# Patient Record
Sex: Male | Born: 1955 | Race: White | Hispanic: No | Marital: Single | Smoking: Current every day smoker
Health system: Southern US, Community
[De-identification: ages and names within clinical notes are randomized; demographics above are authoritative.]

## PROBLEM LIST (undated history)

## (undated) ENCOUNTER — Ambulatory Visit: Admission: EM | Payer: Self-pay | Source: Home / Self Care

## (undated) DIAGNOSIS — E785 Hyperlipidemia, unspecified: Secondary | ICD-10-CM

## (undated) DIAGNOSIS — K760 Fatty (change of) liver, not elsewhere classified: Secondary | ICD-10-CM

## (undated) DIAGNOSIS — K5790 Diverticulosis of intestine, part unspecified, without perforation or abscess without bleeding: Secondary | ICD-10-CM

## (undated) DIAGNOSIS — K635 Polyp of colon: Secondary | ICD-10-CM

## (undated) DIAGNOSIS — E119 Type 2 diabetes mellitus without complications: Secondary | ICD-10-CM

## (undated) DIAGNOSIS — F17200 Nicotine dependence, unspecified, uncomplicated: Secondary | ICD-10-CM

## (undated) DIAGNOSIS — I10 Essential (primary) hypertension: Secondary | ICD-10-CM

## (undated) DIAGNOSIS — R918 Other nonspecific abnormal finding of lung field: Secondary | ICD-10-CM

## (undated) HISTORY — DX: Other nonspecific abnormal finding of lung field: R91.8

## (undated) HISTORY — DX: Nicotine dependence, unspecified, uncomplicated: F17.200

## (undated) HISTORY — DX: Essential (primary) hypertension: I10

## (undated) HISTORY — DX: Type 2 diabetes mellitus without complications: E11.9

## (undated) HISTORY — DX: Diverticulosis of intestine, part unspecified, without perforation or abscess without bleeding: K57.90

## (undated) HISTORY — PX: APPENDECTOMY: SHX54

## (undated) HISTORY — DX: Polyp of colon: K63.5

## (undated) HISTORY — DX: Fatty (change of) liver, not elsewhere classified: K76.0

## (undated) HISTORY — DX: Hyperlipidemia, unspecified: E78.5

---

## 2000-02-21 ENCOUNTER — Emergency Department (HOSPITAL_COMMUNITY): Admission: EM | Admit: 2000-02-21 | Discharge: 2000-02-21 | Payer: Self-pay | Admitting: Emergency Medicine

## 2015-01-02 ENCOUNTER — Encounter: Payer: Self-pay | Admitting: Internal Medicine

## 2015-01-02 ENCOUNTER — Ambulatory Visit: Payer: Self-pay | Attending: Internal Medicine | Admitting: Internal Medicine

## 2015-01-02 VITALS — BP 136/90 | HR 80 | Temp 97.8°F | Resp 16 | Wt 229.6 lb

## 2015-01-02 DIAGNOSIS — Z1211 Encounter for screening for malignant neoplasm of colon: Secondary | ICD-10-CM

## 2015-01-02 DIAGNOSIS — Z720 Tobacco use: Secondary | ICD-10-CM | POA: Insufficient documentation

## 2015-01-02 DIAGNOSIS — Z87448 Personal history of other diseases of urinary system: Secondary | ICD-10-CM

## 2015-01-02 DIAGNOSIS — Z139 Encounter for screening, unspecified: Secondary | ICD-10-CM

## 2015-01-02 DIAGNOSIS — IMO0001 Reserved for inherently not codable concepts without codable children: Secondary | ICD-10-CM

## 2015-01-02 DIAGNOSIS — Z833 Family history of diabetes mellitus: Secondary | ICD-10-CM

## 2015-01-02 DIAGNOSIS — R319 Hematuria, unspecified: Secondary | ICD-10-CM | POA: Insufficient documentation

## 2015-01-02 DIAGNOSIS — Z23 Encounter for immunization: Secondary | ICD-10-CM

## 2015-01-02 DIAGNOSIS — R1030 Lower abdominal pain, unspecified: Secondary | ICD-10-CM | POA: Insufficient documentation

## 2015-01-02 DIAGNOSIS — F17200 Nicotine dependence, unspecified, uncomplicated: Secondary | ICD-10-CM | POA: Insufficient documentation

## 2015-01-02 LAB — CBC WITH DIFFERENTIAL/PLATELET
BASOS PCT: 0 % (ref 0–1)
Basophils Absolute: 0 10*3/uL (ref 0.0–0.1)
Eosinophils Absolute: 0.1 10*3/uL (ref 0.0–0.7)
Eosinophils Relative: 2 % (ref 0–5)
HEMATOCRIT: 41.3 % (ref 39.0–52.0)
HEMOGLOBIN: 14.4 g/dL (ref 13.0–17.0)
LYMPHS ABS: 1.5 10*3/uL (ref 0.7–4.0)
Lymphocytes Relative: 26 % (ref 12–46)
MCH: 30.8 pg (ref 26.0–34.0)
MCHC: 34.9 g/dL (ref 30.0–36.0)
MCV: 88.4 fL (ref 78.0–100.0)
MONO ABS: 0.3 10*3/uL (ref 0.1–1.0)
MONOS PCT: 5 % (ref 3–12)
MPV: 9.6 fL (ref 8.6–12.4)
NEUTROS ABS: 3.9 10*3/uL (ref 1.7–7.7)
Neutrophils Relative %: 67 % (ref 43–77)
Platelets: 262 10*3/uL (ref 150–400)
RBC: 4.67 MIL/uL (ref 4.22–5.81)
RDW: 14.2 % (ref 11.5–15.5)
WBC: 5.8 10*3/uL (ref 4.0–10.5)

## 2015-01-02 LAB — LIPID PANEL
Cholesterol: 181 mg/dL (ref 0–200)
HDL: 30 mg/dL — ABNORMAL LOW (ref >=40)
LDL Cholesterol: 79 mg/dL (ref 0–99)
Total CHOL/HDL Ratio: 6 Ratio
Triglycerides: 362 mg/dL — ABNORMAL HIGH (ref <150)
VLDL: 72 mg/dL — ABNORMAL HIGH (ref 0–40)

## 2015-01-02 LAB — COMPLETE METABOLIC PANEL WITH GFR
ALBUMIN: 4.6 g/dL (ref 3.5–5.2)
ALK PHOS: 46 U/L (ref 39–117)
ALT: 43 U/L (ref 0–53)
AST: 38 U/L — ABNORMAL HIGH (ref 0–37)
BILIRUBIN TOTAL: 0.5 mg/dL (ref 0.2–1.2)
BUN: 21 mg/dL (ref 6–23)
CO2: 26 mEq/L (ref 19–32)
Calcium: 9.5 mg/dL (ref 8.4–10.5)
Chloride: 103 mEq/L (ref 96–112)
Creat: 0.8 mg/dL (ref 0.50–1.35)
GFR, Est African American: >89 mL/min
Glucose, Bld: 119 mg/dL — ABNORMAL HIGH (ref 70–99)
POTASSIUM: 4.6 meq/L (ref 3.5–5.3)
Sodium: 139 mEq/L (ref 135–145)
Total Protein: 7.3 g/dL (ref 6.0–8.3)

## 2015-01-02 LAB — TSH: TSH: 1.807 u[IU]/mL (ref 0.350–4.500)

## 2015-01-02 LAB — HEMOGLOBIN A1C
Hgb A1c MFr Bld: 6.6 % — ABNORMAL HIGH (ref <5.7)
MEAN PLASMA GLUCOSE: 143 mg/dL — AB (ref <117)

## 2015-01-02 NOTE — Patient Instructions (Signed)
Smoking Cessation Quitting smoking is important to your health and has many advantages. However, it is not always easy to quit since nicotine is a very addictive drug. Oftentimes, people try 3 times or more before being able to quit. This document explains the best ways for you to prepare to quit smoking. Quitting takes hard work and a lot of effort, but you can do it. ADVANTAGES OF QUITTING SMOKING  You will live longer, feel better, and live better.  Your body will feel the impact of quitting smoking almost immediately.  Within 20 minutes, blood pressure decreases. Your pulse returns to its normal level.  After 8 hours, carbon monoxide levels in the blood return to normal. Your oxygen level increases.  After 24 hours, the chance of having a heart attack starts to decrease. Your breath, hair, and body stop smelling like smoke.  After 48 hours, damaged nerve endings begin to recover. Your sense of taste and smell improve.  After 72 hours, the body is virtually free of nicotine. Your bronchial tubes relax and breathing becomes easier.  After 2 to 12 weeks, lungs can hold more air. Exercise becomes easier and circulation improves.  The risk of having a heart attack, stroke, cancer, or lung disease is greatly reduced.  After 1 year, the risk of coronary heart disease is cut in half.  After 5 years, the risk of stroke falls to the same as a nonsmoker.  After 10 years, the risk of lung cancer is cut in half and the risk of other cancers decreases significantly.  After 15 years, the risk of coronary heart disease drops, usually to the level of a nonsmoker.  If you are pregnant, quitting smoking will improve your chances of having a healthy baby.  The people you live with, especially any children, will be healthier.  You will have extra money to spend on things other than cigarettes. QUESTIONS TO THINK ABOUT BEFORE ATTEMPTING TO QUIT You may want to talk about your answers with your  health care provider.  Why do you want to quit?  If you tried to quit in the past, what helped and what did not?  What will be the most difficult situations for you after you quit? How will you plan to handle them?  Who can help you through the tough times? Your family? Friends? A health care provider?  What pleasures do you get from smoking? What ways can you still get pleasure if you quit? Here are some questions to ask your health care provider:  How can you help me to be successful at quitting?  What medicine do you think would be best for me and how should I take it?  What should I do if I need more help?  What is smoking withdrawal like? How can I get information on withdrawal? GET READY  Set a quit date.  Change your environment by getting rid of all cigarettes, ashtrays, matches, and lighters in your home, car, or work. Do not let people smoke in your home.  Review your past attempts to quit. Think about what worked and what did not. GET SUPPORT AND ENCOURAGEMENT You have a better chance of being successful if you have help. You can get support in many ways.  Tell your family, friends, and coworkers that you are going to quit and need their support. Ask them not to smoke around you.  Get individual, group, or telephone counseling and support. Programs are available at local hospitals and health centers. Call   your local health department for information about programs in your area.  Spiritual beliefs and practices may help some smokers quit.  Download a "quit meter" on your computer to keep track of quit statistics, such as how long you have gone without smoking, cigarettes not smoked, and money saved.  Get a self-help book about quitting smoking and staying off tobacco. LEARN NEW SKILLS AND BEHAVIORS  Distract yourself from urges to smoke. Talk to someone, go for a walk, or occupy your time with a task.  Change your normal routine. Take a different route to work.  Drink tea instead of coffee. Eat breakfast in a different place.  Reduce your stress. Take a hot bath, exercise, or read a book.  Plan something enjoyable to do every day. Reward yourself for not smoking.  Explore interactive web-based programs that specialize in helping you quit. GET MEDICINE AND USE IT CORRECTLY Medicines can help you stop smoking and decrease the urge to smoke. Combining medicine with the above behavioral methods and support can greatly increase your chances of successfully quitting smoking.  Nicotine replacement therapy helps deliver nicotine to your body without the negative effects and risks of smoking. Nicotine replacement therapy includes nicotine gum, lozenges, inhalers, nasal sprays, and skin patches. Some may be available over-the-counter and others require a prescription.  Antidepressant medicine helps people abstain from smoking, but how this works is unknown. This medicine is available by prescription.  Nicotinic receptor partial agonist medicine simulates the effect of nicotine in your brain. This medicine is available by prescription. Ask your health care provider for advice about which medicines to use and how to use them based on your health history. Your health care provider will tell you what side effects to look out for if you choose to be on a medicine or therapy. Carefully read the information on the package. Do not use any other product containing nicotine while using a nicotine replacement product.  RELAPSE OR DIFFICULT SITUATIONS Most relapses occur within the first 3 months after quitting. Do not be discouraged if you start smoking again. Remember, most people try several times before finally quitting. You may have symptoms of withdrawal because your body is used to nicotine. You may crave cigarettes, be irritable, feel very hungry, cough often, get headaches, or have difficulty concentrating. The withdrawal symptoms are only temporary. They are strongest  when you first quit, but they will go away within 10-14 days. To reduce the chances of relapse, try to:  Avoid drinking alcohol. Drinking lowers your chances of successfully quitting.  Reduce the amount of caffeine you consume. Once you quit smoking, the amount of caffeine in your body increases and can give you symptoms, such as a rapid heartbeat, sweating, and anxiety.  Avoid smokers because they can make you want to smoke.  Do not let weight gain distract you. Many smokers will gain weight when they quit, usually less than 10 pounds. Eat a healthy diet and stay active. You can always lose the weight gained after you quit.  Find ways to improve your mood other than smoking. FOR MORE INFORMATION  www.smokefree.gov  Document Released: 09/14/2001 Document Revised: 02/04/2014 Document Reviewed: 12/30/2011 Exit[employer] Patient Information 2015 [employer]. This information is not intended to replace advice given to you by your health care provider. Make sure you discuss any questions you have with your health care provider.  

## 2015-01-02 NOTE — Progress Notes (Signed)
Patient here to establish care Complains of constant stomach pain Wakes up every day complaining of the pain Had a scan done at [facility] hospital and was told he has bilateral hernias Patient has not yet had a colonoscopy

## 2015-01-02 NOTE — Progress Notes (Signed)
Patient Demographics  Kyle Campos, is a 59 y.o. male  FXT:[id number]  GDJ:[id number]  DOB - Sep 27, 1956  CC:  Chief Complaint  Patient presents with  . Establish Care       HPI: Kyle Campos is a 59 y.o. male here today to establish medical care.patient reported to have lower abdominal pain on and off for several months as per patient he was seen by one of the providers and had a CAT scan done ?was told he has hernia, denies any reflux symptoms, he was also told that he has blood in his urine, never been diagnosed with any kidney stones, he denies any change in bowel habits denies any urinary symptoms denies any nausea vomiting, denies any change in weight or appetite, does smoke cigarettes, I have counseled patient to quit smoking. Patient also reports family history of diabetes. Patient has No headache, No chest pain, No abdominal pain - No Nausea, No new weakness tingling or numbness, No Cough - SOB.  No Known Allergies History reviewed. No pertinent past medical history. No current outpatient prescriptions on file prior to visit.   No current facility-administered medications on file prior to visit.   Family History  Problem Relation Age of Onset  . Diabetes Mother   . Cancer Father     lung cancer   . Stroke Maternal Grandfather    History   Social History  . Marital Status: Single    Spouse Name: N/A  . Number of Children: N/A  . Years of Education: N/A   Occupational History  . Not on file.   Social History Main Topics  . Smoking status: Current Every Day Smoker -- 1.00 packs/day for 35 years  . Smokeless tobacco: Not on file  . Alcohol Use: No  . Drug Use: Not on file  . Sexual Activity: Not on file   Other Topics Concern  . Not on file   Social History Narrative  . No narrative on file    Review of Systems: Constitutional: Negative for fever, chills, diaphoresis, activity change, appetite change and fatigue. HENT: Negative for ear pain,  nosebleeds, congestion, facial swelling, rhinorrhea, neck pain, neck stiffness and ear discharge.  Eyes: Negative for pain, discharge, redness, itching and visual disturbance. Respiratory: Negative for cough, choking, chest tightness, shortness of breath, wheezing and stridor.  Cardiovascular: Negative for chest pain, palpitations and leg swelling. Gastrointestinal: Negative for abdominal distention. Genitourinary: Negative for dysuria, urgency, frequency, hematuria, flank pain, decreased urine volume, difficulty urinating and dyspareunia.  Musculoskeletal: Negative for back pain, joint swelling, arthralgia and gait problem. Neurological: Negative for dizziness, tremors, seizures, syncope, facial asymmetry, speech difficulty, weakness, light-headedness, numbness and headaches.  Hematological: Negative for adenopathy. Does not bruise/bleed easily. Psychiatric/Behavioral: Negative for hallucinations, behavioral problems, confusion, dysphoric mood, decreased concentration and agitation.    Objective:   Filed Vitals:   01/02/15 1108  BP: 136/90  Pulse: 80  Temp: 97.8 F (36.6 C)  Resp: 16    Physical Exam: Constitutional: Patient appears well-developed and well-nourished. No distress. HENT: Normocephalic, atraumatic, External right and left ear normal. Oropharynx is clear and moist.  Eyes: Conjunctivae and EOM are normal. PERRLA, no scleral icterus. Neck: Normal ROM. Neck supple. No JVD. No tracheal deviation. No thyromegaly. CVS: RRR, S1/S2 +, no murmurs, no gallops, no carotid bruit.  Pulmonary: Effort and breath sounds normal, no stridor, rhonchi, wheezes, rales.  Abdominal: Soft. BS +, no distension, left lower abdomen tenderness with deep palpation. No rebound or guarding  Musculoskeletal: Normal range of motion. No edema and no tenderness.  Neuro: Alert. Normal reflexes, muscle tone coordination. No cranial nerve deficit. Skin: Skin is warm and dry. No rash noted. Not diaphoretic.  No erythema. No pallor. Psychiatric: Normal mood and affect. Behavior, judgment, thought content normal.  No results found for: WBC, HGB, HCT, MCV, PLT No results found for: CREATININE, BUN, NA, K, CL, CO2  No results found for: HGBA1C Lipid Panel  No results found for: CHOL, TRIG, HDL, CHOLHDL, VLDL, LDLCALC     Assessment and plan:   1. Lower abdominal pain  - CT Abdomen Pelvis Wo Contrast; Future  2. History of hematuria  - Urinalysis, Routine w reflex microscopic  3. Family history of diabetes mellitus (DM)  - Hemoglobin A1c  4. Smoking Advise patient to quit smoking.  5. Screening Ordered baseline blood work. - TSH - COMPLETE METABOLIC PANEL WITH GFR - CBC with Differential/Platelet - Lipid panel - Vit D  25 hydroxy (rtn osteoporosis monitoring)  6. Need for prophylactic vaccination against Streptococcus pneumoniae (pneumococcus)  - Pneumococcal polysaccharide vaccine 23-valent greater than or equal to 2yo subcutaneous/IM  7. Special screening for malignant neoplasms, colon  - Ambulatory referral to Gastroenterology      Health Maintenance -Colonoscopy:referred to GI  -Vaccinations: Pneumovax given today.   Return in about 3 months (around 04/03/2015), or if symptoms worsen or fail to improve.    The patient was given clear instructions to go to ER or return to medical center if symptoms don't improve, worsen or new problems develop. The patient verbalized understanding. The patient was told to call to get lab results if they haven't heard anything in the next week.    This note has been created with Surveyor, quantity. Any transcriptional errors are unintentional.   Lorayne Marek, MD

## 2015-01-03 ENCOUNTER — Ambulatory Visit (HOSPITAL_COMMUNITY)
Admission: RE | Admit: 2015-01-03 | Discharge: 2015-01-03 | Disposition: A | Discharge status: Home / Self Care | Payer: Self-pay | Source: Ambulatory Visit | Attending: Internal Medicine | Admitting: Internal Medicine

## 2015-01-03 ENCOUNTER — Encounter (HOSPITAL_COMMUNITY): Payer: Self-pay

## 2015-01-03 DIAGNOSIS — R1030 Lower abdominal pain, unspecified: Secondary | ICD-10-CM

## 2015-01-03 DIAGNOSIS — R918 Other nonspecific abnormal finding of lung field: Secondary | ICD-10-CM | POA: Insufficient documentation

## 2015-01-03 DIAGNOSIS — R1032 Left lower quadrant pain: Secondary | ICD-10-CM | POA: Insufficient documentation

## 2015-01-03 DIAGNOSIS — K760 Fatty (change of) liver, not elsewhere classified: Secondary | ICD-10-CM | POA: Insufficient documentation

## 2015-01-03 DIAGNOSIS — R319 Hematuria, unspecified: Secondary | ICD-10-CM | POA: Insufficient documentation

## 2015-01-03 DIAGNOSIS — K5790 Diverticulosis of intestine, part unspecified, without perforation or abscess without bleeding: Secondary | ICD-10-CM | POA: Insufficient documentation

## 2015-01-03 DIAGNOSIS — Z9049 Acquired absence of other specified parts of digestive tract: Secondary | ICD-10-CM | POA: Insufficient documentation

## 2015-01-03 LAB — URINALYSIS, ROUTINE W REFLEX MICROSCOPIC
Bilirubin Urine: NEGATIVE
GLUCOSE, UA: 100 mg/dL — AB
Ketones, ur: NEGATIVE mg/dL
LEUKOCYTES UA: NEGATIVE
Nitrite: NEGATIVE
PROTEIN: NEGATIVE mg/dL
SPECIFIC GRAVITY, URINE: 1.023 (ref 1.005–1.030)
Urobilinogen, UA: 1 mg/dL (ref 0.0–1.0)
pH: 6.5 (ref 5.0–8.0)

## 2015-01-03 LAB — VITAMIN D 25 HYDROXY (VIT D DEFICIENCY, FRACTURES): Vit D, 25-Hydroxy: 28 ng/mL — ABNORMAL LOW (ref 30–100)

## 2015-01-03 LAB — URINALYSIS, MICROSCOPIC ONLY
Bacteria, UA: NONE SEEN
Casts: NONE SEEN
Crystals: NONE SEEN
SQUAMOUS EPITHELIAL / LPF: NONE SEEN

## 2015-01-06 ENCOUNTER — Encounter: Payer: Self-pay | Admitting: Internal Medicine

## 2015-01-13 ENCOUNTER — Telehealth: Payer: Self-pay

## 2015-01-13 ENCOUNTER — Telehealth: Payer: Self-pay | Admitting: *Deleted

## 2015-01-13 NOTE — Telephone Encounter (Signed)
Spoke with patietn and informed him of below

## 2015-01-13 NOTE — Telephone Encounter (Signed)
-----   Message from Lorayne Marek, MD sent at 01/03/2015 12:05 PM EDT ----- Patient hemoglobin A1c 6.6%, patient has diabetes, advise patient for low carbohydrate diet and start taking metformin 500 mg daily, will repeat A1c in 3 months. Also noted elevated triglycerides( also reported fatty liver on the CT scan) advised patient for low fat diet and exercise. , noticed low vitamin D, advise patient to start taking OTC 2000 units daily. Urine  shows trace hemoglobin, though microscopy for  RBCs is negative, CT scan was negative for renal stones, will observe for now repeat UA in the future if persistent will consider referral to urology.

## 2015-01-13 NOTE — Telephone Encounter (Signed)
-----   Message from Dorothe Pea, LPN sent at 8/52/7782  1:59 PM EDT -----   ----- Message -----    From: Lorayne Marek, MD    Sent: 01/03/2015  11:59 AM      To: Dorothe Pea, LPN  Call and let the patient know that his CT abdomen is negative for renal stones ,reported fatty liver ( advise patient for diet and exercise and lose weight) also reported lung  Nodule, will require chest CT scan in one year time.

## 2015-01-13 NOTE — Telephone Encounter (Signed)
Pt is aware of his CT report.

## 2015-01-14 ENCOUNTER — Other Ambulatory Visit: Payer: Self-pay

## 2015-01-14 MED ORDER — METFORMIN HCL 500 MG PO TABS: 500.0000 mg | ORAL_TABLET | Freq: Every day | ORAL | Status: DC

## 2015-01-31 ENCOUNTER — Ambulatory Visit: Payer: Self-pay

## 2015-02-26 ENCOUNTER — Ambulatory Visit: Payer: Self-pay | Attending: Internal Medicine

## 2015-02-28 ENCOUNTER — Ambulatory Visit (AMBULATORY_SURGERY_CENTER): Payer: Self-pay

## 2015-02-28 VITALS — Ht 73.0 in | Wt 216.6 lb

## 2015-02-28 DIAGNOSIS — Z1211 Encounter for screening for malignant neoplasm of colon: Secondary | ICD-10-CM

## 2015-02-28 NOTE — Progress Notes (Signed)
No allergies to eggs or soy No diet/weight loss meds No home oxygen No past problems with anesthesia  Has email  Emmi instructions given for colonoscopy 

## 2015-03-14 ENCOUNTER — Encounter: Payer: Self-pay | Admitting: Internal Medicine

## 2015-03-14 ENCOUNTER — Ambulatory Visit (AMBULATORY_SURGERY_CENTER): Payer: Self-pay | Admitting: Internal Medicine

## 2015-03-14 VITALS — BP 113/49 | HR 69 | Temp 96.6°F | Resp 25 | Ht 73.0 in | Wt 216.0 lb

## 2015-03-14 DIAGNOSIS — Z1211 Encounter for screening for malignant neoplasm of colon: Secondary | ICD-10-CM

## 2015-03-14 DIAGNOSIS — K633 Ulcer of intestine: Secondary | ICD-10-CM

## 2015-03-14 DIAGNOSIS — D120 Benign neoplasm of cecum: Secondary | ICD-10-CM

## 2015-03-14 DIAGNOSIS — D124 Benign neoplasm of descending colon: Secondary | ICD-10-CM

## 2015-03-14 DIAGNOSIS — K635 Polyp of colon: Secondary | ICD-10-CM

## 2015-03-14 DIAGNOSIS — D122 Benign neoplasm of ascending colon: Secondary | ICD-10-CM

## 2015-03-14 HISTORY — PX: OTHER SURGICAL HISTORY: SHX169

## 2015-03-14 HISTORY — DX: Polyp of colon: K63.5

## 2015-03-14 LAB — GLUCOSE, CAPILLARY
GLUCOSE-CAPILLARY: 106 mg/dL — AB (ref 65–99)
Glucose-Capillary: 92 mg/dL (ref 65–99)

## 2015-03-14 MED ORDER — SODIUM CHLORIDE 0.9 % IV SOLN: 500.0000 mL | INTRAVENOUS | Status: DC

## 2015-03-14 NOTE — Progress Notes (Signed)
A/ox3 pleased with MAC, report to Suzanne RN 

## 2015-03-14 NOTE — Patient Instructions (Signed)
YOU HAD AN ENDOSCOPIC PROCEDURE TODAY AT [facility]:   Refer to the procedure report that was given to you for any specific questions about what was found during the examination.  If the procedure report does not answer your questions, please call your gastroenterologist to clarify.  If you requested that your care partner not be given the details of your procedure findings, then the procedure report has been included in a sealed envelope for you to review at your convenience later.  YOU SHOULD EXPECT: Some feelings of bloating in the abdomen. Passage of more gas than usual.  Walking can help get rid of the air that was put into your GI tract during the procedure and reduce the bloating. If you had a lower endoscopy (such as a colonoscopy or flexible sigmoidoscopy) you may notice spotting of blood in your stool or on the toilet paper. If you underwent a bowel prep for your procedure, you may not have a normal bowel movement for a few days.  Please Note:  You might notice some irritation and congestion in your nose or some drainage.  This is from the oxygen used during your procedure.  There is no need for concern and it should clear up in a day or so.  SYMPTOMS TO REPORT IMMEDIATELY:   Following lower endoscopy (colonoscopy or flexible sigmoidoscopy):  Excessive amounts of blood in the stool  Significant tenderness or worsening of abdominal pains  Swelling of the abdomen that is new, acute  Fever of 100F or higher   For urgent or emergent issues, a gastroenterologist can be reached at any hour by calling [phone].   DIET: Your first meal following the procedure should be a small meal and then it is ok to progress to your normal diet. Heavy or fried foods are harder to digest and may make you feel nauseous or bloated.  Likewise, meals heavy in dairy and vegetables can increase bloating.  Drink plenty of fluids but you should avoid alcoholic beverages for 24 hours. Increase  the fiber in your diet.  ACTIVITY:  You should plan to take it easy for the rest of today and you should NOT DRIVE or use heavy machinery until tomorrow (because of the sedation medicines used during the test).    FOLLOW UP: Our staff will call the number listed on your records the next business day following your procedure to check on you and address any questions or concerns that you may have regarding the information given to you following your procedure. If we do not reach you, we will leave a message.  However, if you are feeling well and you are not experiencing any problems, there is no need to return our call.  We will assume that you have returned to your regular daily activities without incident.  If any biopsies were taken you will be contacted by phone or by letter within the next 1-3 weeks.  Please call us at [phone] if you have not heard about the biopsies in 3 weeks.    SIGNATURES/CONFIDENTIALITY: You and/or your care partner have signed paperwork which will be entered into your electronic medical record.  These signatures attest to the fact that that the information above on your After Visit Summary has been reviewed and is understood.  Full responsibility of the confidentiality of this discharge information lies with you and/or your care-partner.  AVOID ALL NSAIDS FOR TWO WEEKS PER DR PYRTLE.   READ ALL HANDOUTS GIVEN TO YOU BY  YOUR RECOVERY ROOM NURSE.

## 2015-03-14 NOTE — Op Note (Signed)
[place]  [address] [place], [address]   COLONOSCOPY PROCEDURE REPORT  PATIENT: Kyle, Campos  MR#: [id number] BIRTHDATE: Jun 14, 1956 , 13  yrs. old GENDER: male ENDOSCOPIST: Jerene Bears, MD REFERRED XA:JOINOM Advani, MD PROCEDURE DATE:  03/14/2015 PROCEDURE:   Colonoscopy, screening and Colonoscopy with snare polypectomy First Screening Colonoscopy - Avg.  risk and is 50 yrs.  old or older Yes.  Prior Negative Screening - Now for repeat screening. N/A  History of Adenoma - Now for follow-up colonoscopy & has been > or = to 3 yrs.  N/A  Polyps removed today? Yes ASA CLASS:   Class II INDICATIONS:Screening for colonic neoplasia and Colorectal Neoplasm Risk Assessment for this procedure is average risk. MEDICATIONS: Monitored anesthesia care and Propofol 400 mg IV  DESCRIPTION OF PROCEDURE:   After the risks benefits and alternatives of the procedure were thoroughly explained, informed consent was obtained.  The digital rectal exam revealed no rectal mass.   The LB [id number]  endoscope was introduced through the anus and advanced to the terminal ileum which was intubated for a short distance. No adverse events experienced.   The quality of the prep was good.  (Suprep was used)  The instrument was then slowly withdrawn as the colon was fully examined. Estimated blood loss is zero unless otherwise noted in this procedure report.   COLON FINDINGS: Abnormal mucosa was found in the terminal ileum. The mucosa was erythematous and had superficial ulcers.  Multiple biopsies of the area were performed using cold forceps.   Four sessile polyps ranging from 4 to 35mm in size were found at the cecum (2) and in the ascending colon (2).  Polypectomies were performed with a cold snare.  The resection was complete, the polyp tissue was completely retrieved and sent to histology.   Two sessile polyps ranging from 5 to 65mm in size were found in the descending  colon.  Polypectomies were performed with a cold snare. The resection was complete, the polyp tissue was completely retrieved and sent to histology.   There was moderate diverticulosis noted in the ascending colon and left colon with associated muscular hypertrophy.  Retroflexed views revealed internal hemorrhoids. The time to cecum = 3.0 Withdrawal time = 21.0   The scope was withdrawn and the procedure completed.  COMPLICATIONS: There were no immediate complications.  ENDOSCOPIC IMPRESSION: 1.   Abnormal mucosa was found in the terminal ileum; The mucosa was erythematous and had superficial ulcers; multiple biopsies of the area were performed using cold forceps 2.   Four sessile polyps ranging from 4 to 7mm in size were found at the cecum and in the ascending colon; polypectomies were performed with a cold snare 3.   Two sessile polyps ranging from 5 to 30mm in size were found in the descending colon; polypectomies were performed with a cold snare 4.   There was moderate diverticulosis noted in the ascending colon and left colon  RECOMMENDATIONS: 1.  Avoid all NSAIDS for the next 2 weeks. 2.  Await pathology results 3.  High fiber diet 4.  If the polyps removed today are proven to be adenomatous (pre-cancerous) polyps, you will need a colonoscopy in 3 years. Otherwise you should continue to follow colorectal cancer screening guidelines for "routine risk" patients with a colonoscopy in 10 years.  You will receive a letter within 1-2 weeks with the results of your biopsy as well as final recommendations.  Please call my office if you  have not received a letter after 3 weeks.  eSigned:  Jerene Bears, MD 03/14/2015 9:33 AM   cc: the patient, Dr. Annitta Needs   PATIENT NAME:  Kyle, Campos MR#: [id number]

## 2015-03-14 NOTE — Progress Notes (Signed)
Called to room to assist during endoscopic procedure.  Patient ID and intended procedure confirmed with present staff. Received instructions for my participation in the procedure from the performing physician.  

## 2015-03-17 ENCOUNTER — Telehealth: Payer: Self-pay | Admitting: *Deleted

## 2015-03-17 NOTE — Telephone Encounter (Signed)
  Follow up Call-  Call back number 03/14/2015  Post procedure Call Back phone  # [phone]  Permission to leave phone message Yes     Patient questions:  Do you have a fever, pain , or abdominal swelling? No. Pain Score  0 *  Have you tolerated food without any problems? Yes.    Have you been able to return to your normal activities? Yes.    Do you have any questions about your discharge instructions: Diet   No. Medications  No. Follow up visit  No.  Do you have questions or concerns about your Care? No.  Actions: * If pain score is 4 or above: No action needed, pain <4.

## 2015-03-20 ENCOUNTER — Encounter: Payer: Self-pay | Admitting: Internal Medicine

## 2015-04-03 ENCOUNTER — Telehealth: Payer: Self-pay | Admitting: Internal Medicine

## 2015-04-04 NOTE — Telephone Encounter (Signed)
Pt states he has been having some bright red rectal bleeding. Pt scheduled to see Amy Esterwood PA 04/17/15@2 :30pm. Pt aware of appt.

## 2015-04-15 ENCOUNTER — Encounter: Payer: Self-pay | Admitting: *Deleted

## 2015-04-17 ENCOUNTER — Ambulatory Visit: Payer: Self-pay | Admitting: Physician Assistant

## 2015-05-14 ENCOUNTER — Encounter: Payer: Self-pay | Admitting: *Deleted

## 2015-06-11 ENCOUNTER — Other Ambulatory Visit: Payer: Self-pay | Admitting: Internal Medicine

## 2015-06-18 ENCOUNTER — Ambulatory Visit: Payer: Self-pay | Admitting: Internal Medicine

## 2015-06-19 ENCOUNTER — Encounter: Payer: Self-pay | Admitting: Family Medicine

## 2015-06-19 ENCOUNTER — Ambulatory Visit (HOSPITAL_COMMUNITY)
Admission: RE | Admit: 2015-06-19 | Discharge: 2015-06-19 | Disposition: A | Discharge status: Home / Self Care | Payer: Self-pay | Source: Ambulatory Visit | Attending: Family Medicine | Admitting: Family Medicine

## 2015-06-19 ENCOUNTER — Ambulatory Visit: Payer: Self-pay | Attending: Family Medicine | Admitting: Family Medicine

## 2015-06-19 VITALS — BP 138/90 | HR 78 | Temp 97.7°F | Resp 18 | Ht 73.0 in | Wt 208.0 lb

## 2015-06-19 DIAGNOSIS — E119 Type 2 diabetes mellitus without complications: Secondary | ICD-10-CM | POA: Insufficient documentation

## 2015-06-19 DIAGNOSIS — R059 Cough, unspecified: Secondary | ICD-10-CM

## 2015-06-19 DIAGNOSIS — Z720 Tobacco use: Secondary | ICD-10-CM | POA: Insufficient documentation

## 2015-06-19 DIAGNOSIS — E1121 Type 2 diabetes mellitus with diabetic nephropathy: Secondary | ICD-10-CM | POA: Insufficient documentation

## 2015-06-19 DIAGNOSIS — B353 Tinea pedis: Secondary | ICD-10-CM | POA: Insufficient documentation

## 2015-06-19 DIAGNOSIS — R05 Cough: Secondary | ICD-10-CM | POA: Insufficient documentation

## 2015-06-19 DIAGNOSIS — F17200 Nicotine dependence, unspecified, uncomplicated: Secondary | ICD-10-CM | POA: Insufficient documentation

## 2015-06-19 LAB — POCT GLYCOSYLATED HEMOGLOBIN (HGB A1C): HEMOGLOBIN A1C: 5.6

## 2015-06-19 LAB — GLUCOSE, POCT (MANUAL RESULT ENTRY): POC GLUCOSE: 170 mg/dL — AB (ref 70–99)

## 2015-06-19 MED ORDER — GLUCOSE BLOOD VI STRP: 1.0000 | ORAL_STRIP | Freq: Three times a day (TID) | Status: DC

## 2015-06-19 MED ORDER — TERBINAFINE HCL 1 % EX CREA: 1.0000 "application " | TOPICAL_CREAM | Freq: Two times a day (BID) | CUTANEOUS | Status: DC

## 2015-06-19 MED ORDER — VARENICLINE TARTRATE 1 MG PO TABS: 1.0000 mg | ORAL_TABLET | Freq: Two times a day (BID) | ORAL | Status: DC

## 2015-06-19 MED ORDER — VARENICLINE TARTRATE 0.5 MG X 11 & 1 MG X 42 PO MISC: ORAL | Status: DC

## 2015-06-19 MED ORDER — DOXYCYCLINE HYCLATE 100 MG PO TABS: 100.0000 mg | ORAL_TABLET | Freq: Two times a day (BID) | ORAL | Status: DC

## 2015-06-19 MED ORDER — TRUE METRIX METER W/DEVICE KIT: 1.0000 | PACK | Status: DC | PRN

## 2015-06-19 MED ORDER — TRUEPLUS LANCETS 28G MISC: 1.0000 | Freq: Three times a day (TID) | Status: DC

## 2015-06-19 NOTE — Patient Instructions (Signed)
Mr. Down,  Thank you for coming in today. It was a pleasure meeting you. I look forward to being your primary doctor.   1. Diabetes: very well controlled Continue metformin as need to keep sugars at goal Diabetes blood sugar goals  Fasting (in AM before breakfast, 8 hrs of no eating or drinking (except water or unsweetened coffee or tea): 90-110 2 hrs after meals: < 160,   No low sugars: nothing < 70   2. Smoking: Smoking cessation support: smoking cessation hotline: 1-800-QUIT-NOW.  Smoking cessation classes are available through [facility]. Call [phone] or visit our website at [email or web address]  Start chantix: Take one 0.5 mg tablet by mouth once daily for 3 days, then increase to one 0.5 mg tablet twice daily for 4 days, then increase to one 1 mg tablet twice daily. Set quit date for 8-35 days after starting chantix.  Be advised that if behavior changes or thoughts of suicide start to stop chantix and call me right away   3. Cough:  Doxycyline for suspected bronchitis, take with full glass of milk or water CXR ordered  Please apply for [facility] discount and orange card, you can also inquire if any of your medications are on the PASS (medications assistance) list.   F/u for flut shot with flu clinic F/u with me in 6 weeks for smoking cessation  Dr. Adrian Blackwater

## 2015-06-19 NOTE — Progress Notes (Signed)
Patient ID: NELSON NOONE, male   DOB: 1956/01/20, 59 y.o.   MRN: [id number]   Subjective:  Patient ID: JERRI HARGADON, male    DOB: 01-24-1956  Age: 59 y.o. MRN: [id number]  CC: Cough and Diabetes   HPI HANISH LARAIA presents for f.u of diabetes and cough   1. Diabetes: out of metformin for one week. No polyuria, polydipsia or vision changes.   2. Cough: x one week. Productive of yellow sputums. Smokes 1 PPD x 40 yrs. No CP or SOB. No fever. Fatigue. Symptoms are improving overall.   Social History  Substance Use Topics  . Smoking status: Current Every Day Smoker -- 1.00 packs/day for 35 years  . Smokeless tobacco: Never Used  . Alcohol Use: 0.0 oz/week    0 Standard drinks or equivalent per week     Comment: once monthly    Outpatient Prescriptions Prior to Visit  Medication Sig Dispense Refill  . metFORMIN (GLUCOPHAGE) 500 MG tablet Take 1 tablet (500 mg total) by mouth daily. 30 tablet 3   No facility-administered medications prior to visit.    ROS Review of Systems  Constitutional: Negative for fever, chills, fatigue and unexpected weight change.  HENT: Positive for congestion.   Eyes: Negative for visual disturbance.  Respiratory: Positive for cough. Negative for shortness of breath.   Cardiovascular: Negative for chest pain, palpitations and leg swelling.  Gastrointestinal: Negative for nausea, vomiting, abdominal pain, diarrhea, constipation and blood in stool.  Endocrine: Negative for polydipsia, polyphagia and polyuria.  Musculoskeletal: Negative for myalgias, back pain, arthralgias, gait problem and neck pain.  Skin: Negative for rash.  Allergic/Immunologic: Negative for immunocompromised state.  Hematological: Negative for adenopathy. Does not bruise/bleed easily.  Psychiatric/Behavioral: Negative for suicidal ideas, sleep disturbance and dysphoric mood. The patient is not nervous/anxious.     Objective:  BP 138/90 mmHg  Pulse 78  Temp(Src) 97.7 F  (36.5 C) (Oral)  Resp 18  Ht _0  (1.854 m)  Wt 208 lb (94.348 kg)  BMI 27.45 kg/m2  SpO2 96%  BP/Weight 06/19/2015 03/14/2015 04/02/1600  Systolic BP 093 235 -  Diastolic BP 90 49 -  Wt. (Lbs) 208 216 216.6  BMI 27.45 28.5 28.58   Physical Exam  Constitutional: He appears well-developed and well-nourished. No distress.  HENT:  Mouth/Throat: Oropharynx is clear and moist.  Cardiovascular: Normal rate, regular rhythm, normal heart sounds and intact distal pulses.   Pulmonary/Chest: Effort normal. No respiratory distress. He has wheezes. He has no rales. He exhibits no tenderness.  Musculoskeletal: He exhibits no edema.  Lymphadenopathy:    He has no cervical adenopathy.  Neurological: He is alert.  Skin: Skin is warm and dry. No rash noted. No erythema.     Psychiatric: He has a normal mood and affect.   CBG 171 Lab Results  Component Value Date   HGBA1C 5.60 06/19/2015    Assessment & Plan:   Problem List Items Addressed This Visit    Cough   Relevant Medications   doxycycline (VIBRA-TABS) 100 MG tablet   Other Relevant Orders   DG Chest 2 View   Diabetes type 2, controlled - Primary (Chronic)   Relevant Medications   Blood Glucose Monitoring Suppl (TRUE METRIX METER) W/DEVICE KIT   glucose blood (TRUE METRIX BLOOD GLUCOSE TEST) test strip   TRUEPLUS LANCETS 28G MISC   Other Relevant Orders   POCT glycosylated hemoglobin (Hb A1C) (Completed)   POCT glucose (manual entry) (Completed)   Microalbumin/Creatinine  Ratio, Urine   Smoking   Relevant Medications   varenicline (CHANTIX PAK) 0.5 MG X 11 & 1 MG X 42 tablet   varenicline (CHANTIX CONTINUING MONTH PAK) 1 MG tablet   Tinea pedis   Relevant Medications   terbinafine (LAMISIL AT) 1 % cream      No orders of the defined types were placed in this encounter.    Follow-up: No Follow-up on file.   Boykin Nearing MD

## 2015-06-19 NOTE — Progress Notes (Signed)
C/C productive coughing-yellow mucus x 1week  F/U DM  No taking medication x1 week  Tobacco user- 1ppday

## 2015-06-20 LAB — MICROALBUMIN / CREATININE URINE RATIO
Creatinine, Urine: 99.2 mg/dL
MICROALB UR: 1.6 mg/dL (ref <2.0)
Microalb Creat Ratio: 16.1 mg/g (ref 0.0–30.0)

## 2015-06-26 ENCOUNTER — Ambulatory Visit: Payer: Self-pay

## 2015-07-23 ENCOUNTER — Ambulatory Visit: Payer: Self-pay | Admitting: Internal Medicine

## 2015-09-23 ENCOUNTER — Encounter: Payer: Self-pay | Admitting: Internal Medicine

## 2015-09-23 ENCOUNTER — Ambulatory Visit (INDEPENDENT_AMBULATORY_CARE_PROVIDER_SITE_OTHER): Payer: Self-pay | Admitting: Internal Medicine

## 2015-09-23 ENCOUNTER — Other Ambulatory Visit (INDEPENDENT_AMBULATORY_CARE_PROVIDER_SITE_OTHER): Payer: Self-pay

## 2015-09-23 VITALS — BP 130/80 | HR 84 | Ht 73.0 in | Wt 197.0 lb

## 2015-09-23 DIAGNOSIS — R1030 Lower abdominal pain, unspecified: Secondary | ICD-10-CM

## 2015-09-23 DIAGNOSIS — D126 Benign neoplasm of colon, unspecified: Secondary | ICD-10-CM

## 2015-09-23 LAB — URINALYSIS, ROUTINE W REFLEX MICROSCOPIC
BILIRUBIN URINE: NEGATIVE
KETONES UR: NEGATIVE
LEUKOCYTES UA: NEGATIVE
NITRITE: NEGATIVE
Specific Gravity, Urine: 1.025 (ref 1.000–1.030)
TOTAL PROTEIN, URINE-UPE24: NEGATIVE
Urine Glucose: NEGATIVE
Urobilinogen, UA: 0.2 (ref 0.0–1.0)
pH: 6 (ref 5.0–8.0)

## 2015-09-23 LAB — COMPREHENSIVE METABOLIC PANEL
ALBUMIN: 4.7 g/dL (ref 3.5–5.2)
ALK PHOS: 51 U/L (ref 39–117)
ALT: 26 U/L (ref 0–53)
AST: 20 U/L (ref 0–37)
BUN: 19 mg/dL (ref 6–23)
CO2: 30 mEq/L (ref 19–32)
Calcium: 10.1 mg/dL (ref 8.4–10.5)
Chloride: 102 mEq/L (ref 96–112)
Creatinine, Ser: 0.98 mg/dL (ref 0.40–1.50)
GFR: 83.73 mL/min (ref >60.00)
GLUCOSE: 87 mg/dL (ref 70–99)
POTASSIUM: 4.6 meq/L (ref 3.5–5.1)
SODIUM: 140 meq/L (ref 135–145)
Total Bilirubin: 0.6 mg/dL (ref 0.2–1.2)
Total Protein: 7.9 g/dL (ref 6.0–8.3)

## 2015-09-23 LAB — CBC WITH DIFFERENTIAL/PLATELET
Basophils Absolute: 0 10*3/uL (ref 0.0–0.1)
Basophils Relative: 0.3 % (ref 0.0–3.0)
EOS ABS: 0.1 10*3/uL (ref 0.0–0.7)
EOS PCT: 1.3 % (ref 0.0–5.0)
HCT: 45.8 % (ref 39.0–52.0)
HEMOGLOBIN: 15.5 g/dL (ref 13.0–17.0)
Lymphocytes Relative: 20.7 % (ref 12.0–46.0)
Lymphs Abs: 1.8 10*3/uL (ref 0.7–4.0)
MCHC: 33.8 g/dL (ref 30.0–36.0)
MCV: 89.4 fl (ref 78.0–100.0)
MONO ABS: 0.6 10*3/uL (ref 0.1–1.0)
Monocytes Relative: 6.7 % (ref 3.0–12.0)
Neutro Abs: 6.3 10*3/uL (ref 1.4–7.7)
Neutrophils Relative %: 71 % (ref 43.0–77.0)
Platelets: 267 10*3/uL (ref 150.0–400.0)
RBC: 5.13 Mil/uL (ref 4.22–5.81)
RDW: 13.3 % (ref 11.5–15.5)
WBC: 8.9 10*3/uL (ref 4.0–10.5)

## 2015-09-23 MED ORDER — HYOSCYAMINE SULFATE 0.125 MG SL SUBL: SUBLINGUAL_TABLET | SUBLINGUAL | Status: DC

## 2015-09-23 MED ORDER — METRONIDAZOLE 250 MG PO TABS: 250.0000 mg | ORAL_TABLET | Freq: Three times a day (TID) | ORAL | Status: DC

## 2015-09-23 MED ORDER — CIPROFLOXACIN HCL 500 MG PO TABS: 500.0000 mg | ORAL_TABLET | Freq: Two times a day (BID) | ORAL | Status: DC

## 2015-09-23 NOTE — Progress Notes (Signed)
Patient ID: Kyle Campos, male   DOB: 1956/06/17, 59 y.o.   MRN: [id number] HPI: Kyle Campos is a 59 year old male with history of adenomatous colon polyps, diabetes currently diet controlled, diverticulosis who is seen in follow-up. He is known to me from screening colonoscopy which occurred on 03/14/2015. This revealed ileitis with ulcerations and inflammation in the terminal ileum which was biopsied. An 6 polyps ranging from 4-8 mm in size from the cecum, ascending colon and descending colon. There was moderate diverticulosis in the ascending colon and left colon. Polyps were found to be tubular adenoma. The ileal biopsies showed focal active ileitis with no granulomas.  Today he returns stating he's had 3 weeks of bilateral lower abdominal pain. This is a twisting type pain. It is somewhat constant. He points to the suprapubic area. He reports bowel movements have been regular going 1 time per day. No diarrhea or constipation. He had one episode of rectal bleeding with a bowel movement about a week ago. This happens very rarely. It was painless. He's noticed dysuria, increased urinary frequency and nocturia. Appetite has been good. No fevers or chills. No heartburn. No trouble swallowing. No weight loss. He does lift heavy objects at work and this makes his lower abdominal pain worse.  Past Medical History  Diagnosis Date  . Diabetes mellitus ([place])   . Colon polyps 03/14/2015    Tubular adenoma x 6  . Hepatic steatosis   . Diverticulosis   . Lung nodules     Past Surgical History  Procedure Laterality Date  . Appendectomy      Outpatient Prescriptions Prior to Visit  Medication Sig Dispense Refill  . Blood Glucose Monitoring Suppl (TRUE METRIX METER) W/DEVICE KIT 1 each by Does not apply route as needed. 1 kit 0  . doxycycline (VIBRA-TABS) 100 MG tablet Take 1 tablet (100 mg total) by mouth 2 (two) times daily. 20 tablet 0  . glucose blood (TRUE METRIX BLOOD GLUCOSE TEST) test strip 1  each by Other route 3 (three) times daily. 100 each 11  . metFORMIN (GLUCOPHAGE) 500 MG tablet TAKE 1 TABLET BY MOUTH DAILY. 30 tablet 3  . terbinafine (LAMISIL AT) 1 % cream Apply 1 application topically 2 (two) times daily. 30 g 0  . TRUEPLUS LANCETS 28G MISC 1 each by Does not apply route 3 (three) times daily. 100 each 11  . varenicline (CHANTIX CONTINUING MONTH PAK) 1 MG tablet Take 1 tablet (1 mg total) by mouth 2 (two) times daily. 60 tablet 0  . varenicline (CHANTIX PAK) 0.5 MG X 11 & 1 MG X 42 tablet Taper per packet insert 53 tablet 0   No facility-administered medications prior to visit.    No Known Allergies  Family History  Problem Relation Age of Onset  . Diabetes Mother   . Cancer Father     lung cancer   . Stroke Maternal Grandfather   . Colon cancer Neg Hx     Social History  Substance Use Topics  . Smoking status: Current Every Day Smoker -- 1.00 packs/day for 40 years  . Smokeless tobacco: Never Used  . Alcohol Use: 0.0 oz/week    0 Standard drinks or equivalent per week     Comment: once monthly    ROS: As per history of present illness, otherwise negative  BP 130/80 mmHg  Pulse 84  Ht _0  (1.854 m)  Wt 197 lb (89.359 kg)  BMI 26.00 kg/m2 Constitutional: Well-developed and well-nourished. No distress.  HEENT: Normocephalic and atraumatic. Oropharynx is clear and moist. No oropharyngeal exudate. Conjunctivae are normal.  No scleral icterus. Neck: Neck supple. Trachea midline. Cardiovascular: Normal rate, regular rhythm and intact distal pulses. No M/R/G Pulmonary/chest: Effort normal and breath sounds normal. No wheezing, rales or rhonchi. Abdominal: Soft, suprapubic pain with deep palpation without rebound or guarding, nondistended. Bowel sounds active throughout. There are no masses palpable.  Extremities: no clubbing, cyanosis, or edema Lymphadenopathy: No cervical adenopathy noted. Neurological: Alert and oriented to person place and time. Skin:  Skin is warm and dry. No rashes noted. Psychiatric: Normal mood and affect. Behavior is normal.  RELEVANT LABS AND IMAGING: CBC    Component Value Date/Time   WBC 5.8 01/02/2015 1156   RBC 4.67 01/02/2015 1156   HGB 14.4 01/02/2015 1156   HCT 41.3 01/02/2015 1156   PLT 262 01/02/2015 1156   MCV 88.4 01/02/2015 1156   MCH 30.8 01/02/2015 1156   MCHC 34.9 01/02/2015 1156   RDW 14.2 01/02/2015 1156   LYMPHSABS 1.5 01/02/2015 1156   MONOABS 0.3 01/02/2015 1156   EOSABS 0.1 01/02/2015 1156   BASOSABS 0.0 01/02/2015 1156    CMP     Component Value Date/Time   NA 139 01/02/2015 1156   K 4.6 01/02/2015 1156   CL 103 01/02/2015 1156   CO2 26 01/02/2015 1156   GLUCOSE 119* 01/02/2015 1156   BUN 21 01/02/2015 1156   CREATININE 0.80 01/02/2015 1156   CALCIUM 9.5 01/02/2015 1156   PROT 7.3 01/02/2015 1156   ALBUMIN 4.6 01/02/2015 1156   AST 38* 01/02/2015 1156   ALT 43 01/02/2015 1156   ALKPHOS 46 01/02/2015 1156   BILITOT 0.5 01/02/2015 1156   GFRNONAA >89 01/02/2015 1156   GFRAA >89 01/02/2015 1156    ASSESSMENT/PLAN:  59 year old male with history of adenomatous colon polyps, diabetes currently diet controlled, diverticulosis who is seen in follow-up.  1. Lower abd pain -- fairly constant lower abdominal, crampy type, pain over last 3-4 weeks. Some urinary symptoms. Differential cystitis, prostatitis, diverticulitis, and less likely ileitis. His ileitis was not definitely consistent for Crohn's disease and he is not having Crohn's related symptoms currently. He's had no diarrhea. He denies NSAIDs. I'm not convinced that he has Crohn's and symptoms now are more consistent with infectious etiology.  UA with culture, CBC and CMP today. We will begin Cipro 500 twice a day and Flagyl 250 3 times a day 7 days. Follow-up urinalysis and culture. Levsin 0.1251-2 tablets every 4-6 hours as needed for lower abdominal pain. If pain fails to improve or worsens CT scan of the abdomen and  pelvis with contrast will be recommended. I asked that he notify me if symptoms are not dramatically better in 1-2 weeks. He voices understanding.  2. Adenomatous colon polyps -- repeat colonoscopy in 3 years from previous exam  25 minutes spent with the patient today. Greater than 50% was spent in counseling and coordination of care with the patient   SJ:GGEZMO Advani, Md No address on file

## 2015-09-23 NOTE — Patient Instructions (Signed)
Your physician has requested that you go to the basement for the following lab work before leaving today: Urinalysis, culture, CBC, CMP  We have sent the following medications to your pharmacy for you to pick up at your convenience: Cipro 500 mg twice daily x 7 days Flagyl 250 mg three times daily x 7 days Levsin-1 tablet every 4-6 hours as needed  Call our office if your symptoms worsen or do not improve.

## 2015-09-24 LAB — URINE CULTURE
Colony Count: NO GROWTH
Organism ID, Bacteria: NO GROWTH

## 2016-06-16 ENCOUNTER — Telehealth: Payer: Self-pay | Admitting: *Deleted

## 2016-06-16 ENCOUNTER — Encounter (HOSPITAL_COMMUNITY): Payer: Self-pay | Admitting: *Deleted

## 2016-06-16 ENCOUNTER — Emergency Department (HOSPITAL_COMMUNITY)
Admission: EM | Admit: 2016-06-16 | Discharge: 2016-06-16 | Disposition: A | Discharge status: Home / Self Care | Payer: Self-pay | Attending: Emergency Medicine | Admitting: Emergency Medicine

## 2016-06-16 DIAGNOSIS — M542 Cervicalgia: Secondary | ICD-10-CM | POA: Insufficient documentation

## 2016-06-16 DIAGNOSIS — Z79899 Other long term (current) drug therapy: Secondary | ICD-10-CM | POA: Insufficient documentation

## 2016-06-16 DIAGNOSIS — F17200 Nicotine dependence, unspecified, uncomplicated: Secondary | ICD-10-CM | POA: Insufficient documentation

## 2016-06-16 DIAGNOSIS — G8929 Other chronic pain: Secondary | ICD-10-CM | POA: Insufficient documentation

## 2016-06-16 DIAGNOSIS — M549 Dorsalgia, unspecified: Secondary | ICD-10-CM | POA: Insufficient documentation

## 2016-06-16 DIAGNOSIS — Z86010 Personal history of colonic polyps: Secondary | ICD-10-CM | POA: Insufficient documentation

## 2016-06-16 DIAGNOSIS — E119 Type 2 diabetes mellitus without complications: Secondary | ICD-10-CM | POA: Insufficient documentation

## 2016-06-16 MED ORDER — NAPROXEN 500 MG PO TABS: 500.0000 mg | ORAL_TABLET | Freq: Two times a day (BID) | ORAL | 0 refills | Status: DC

## 2016-06-16 MED ORDER — ORPHENADRINE CITRATE ER 100 MG PO TB12
100.0000 mg | ORAL_TABLET | Freq: Two times a day (BID) | ORAL | 0 refills | Status: DC | PRN
Start: 2016-06-16 — End: 2016-06-22

## 2016-06-16 NOTE — ED Triage Notes (Signed)
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 Reports for last week he has had severe pain, no sleep in 2 days. Neurologically intact. Patient appears very uncomfortable during triage.

## 2016-06-16 NOTE — Telephone Encounter (Signed)
Pharmacy called related to Rx: orphenadrine (NORFLEX) 100 MG tablet .Marland KitchenMarland KitchenEDCM clarified with EDP to change Rx to: Flexeril 10mg  PO q8 PRN pain/spasm #12.

## 2016-06-16 NOTE — Discharge Instructions (Signed)
Medications: Naprosyn, Norflex  Treatment: Take Naprosyn twice daily as prescribed. Take Norflex twice daily as needed for muscle pain and spasms. Do not drive or operate machinery when taking this medication. Use moist heat 3-4 times daily alternating 20 minutes on, 20 minutes off. Attempt the stretches that I discussed 3-4 times a day holding each stretch 30 seconds each.  Follow-up: Please follow-up with your primary care provider as soon as possible for follow-up of today's visit and further evaluation and treatment of your symptoms. Please return to emergency department if you develop any new or worsening symptoms.

## 2016-06-16 NOTE — ED Provider Notes (Signed)
[facility] DEPT Provider Note   CSN: [id number] Arrival date & time: 06/16/16  0416     History   Chief Complaint Chief Complaint  Patient Kyle with  . Campos Pain    HPI CHARES PAVELKO is a 60 y.o. male with history of chronic neck and Campos pain for the past 10 years who Kyle Campos, Kyle Campos, Kyle Campos, Kyle Campos, Kyle Campos, saddle anesthesia. Patient also denies any chest pain, shortness of breath, abdominal pain, nausea, vomiting, urinary symptoms. She has tried moist heat without relief. Patient states this pain is usually resolved with only moist heat. Patient has not taken any medications at home.  HPI  Past Medical History:  Diagnosis Date  . Colon polyps 03/14/2015   Tubular adenoma x 6  . Diabetes mellitus ([place])   . Diverticulosis   . Hepatic steatosis   . Lung nodules     Patient Active Problem List   Diagnosis Date Noted  . Diabetes type 2, controlled ([place]) 06/19/2015  . Cough 06/19/2015  . Tinea pedis 06/19/2015  . Family history of diabetes mellitus (DM) 01/02/2015  . Smoking 01/02/2015    Past Surgical History:  Procedure Laterality Date  . APPENDECTOMY         Home Medications    Prior to Admission medications   Medication Sig Start Date End Date Taking? Authorizing Provider  ciprofloxacin (CIPRO) 500 MG tablet Take 1 tablet (500 mg total) by mouth 2 (two) times daily. Patient not taking: Reported on 06/16/2016 09/23/15   Jerene Bears, MD  hyoscyamine (LEVSIN SL) 0.125 MG SL tablet Dissolve 1 tablet under the tongue every 4-6  hours as needed Patient not taking: Reported on 06/16/2016 09/23/15   Jerene Bears, MD  metroNIDAZOLE (FLAGYL) 250 MG tablet Take 1 tablet (250 mg total) by mouth 3 (three) times daily. Patient not taking: Reported on 06/16/2016 09/23/15   Jerene Bears, MD  naproxen (NAPROSYN) 500 MG tablet Take 1 tablet (500 mg total) by mouth 2 (two) times daily. 06/16/16   Frederica Kuster, PA-C  orphenadrine (NORFLEX) 100 MG tablet Take 1 tablet (100 mg total) by mouth 2 (two) times daily as needed for muscle spasms. 06/16/16   Frederica Kuster, PA-C    Family History Family History  Problem Relation Age of Onset  . Diabetes Mother   . Kyle Campos Father     lung Kyle Campos   . Stroke Maternal Grandfather   . Colon Kyle Campos Neg Hx     Social History Social History  Substance Use Topics  . Smoking status: Current Every Day Smoker    Packs/day: 1.00    Years: 40.00  . Smokeless tobacco: Never Used  . Alcohol use 0.0 oz/week     Comment: once monthly     Allergies   Review of patient's allergies indicates no known allergies.   Review of Systems Review of Systems  Constitutional: Negative for chills and fever.  HENT: Negative for facial swelling and sore throat.   Respiratory: Negative for shortness of breath.   Cardiovascular: Negative for chest pain.  Gastrointestinal: Negative for  abdominal pain, nausea and vomiting.  Genitourinary: Negative for dysuria.  Musculoskeletal: Positive for Campos pain and neck pain. Negative for neck stiffness.  Skin: Negative for rash and wound.  Neurological: Negative for headaches.  Psychiatric/Behavioral: The patient is not nervous/anxious.      Physical Exam Updated Vital Signs BP 110/67   Pulse 67   Temp 97.4 F (36.3 C) (Oral)   Resp 20   SpO2 96%   Physical Exam  Constitutional: He appears well-developed and well-nourished. No distress.  HENT:  Head: Normocephalic and atraumatic.  Mouth/Throat: Oropharynx is clear and moist. No oropharyngeal exudate.    Eyes: Conjunctivae are normal. Pupils are equal, round, and reactive to light. Right eye exhibits no discharge. Left eye exhibits no discharge. No scleral icterus.  Neck: Normal range of motion. Neck supple. Muscular tenderness present. No thyromegaly present.    Pain in the left upper trapezius with lateral deviation of the neck to right  Cardiovascular: Normal rate, regular rhythm, normal heart sounds and intact distal pulses.  Exam reveals no gallop and no friction rub.   No murmur heard. Pulmonary/Chest: Effort normal and breath sounds normal. No stridor. No respiratory distress. He has no wheezes. He has no rales.  Abdominal: Soft. Bowel sounds are normal. He exhibits no distension. There is no tenderness. There is no rebound and no guarding.  Musculoskeletal: He exhibits no edema.       Cervical Campos: He exhibits tenderness and spasm.       Thoracic Campos: He exhibits tenderness, pain and spasm. He exhibits no bony tenderness.       Campos:  Lymphadenopathy:    He has no cervical adenopathy.  Neurological: He is alert. Coordination normal.  Normal sensation throughout; 5/5 strength to upper extremities; equal bilateral grip strength  Skin: Skin is warm and dry. No rash noted. He is not diaphoretic. No pallor.  Psychiatric: He has a normal mood and affect.  Nursing note and vitals reviewed.    ED Treatments / Results  Labs (all labs ordered are listed, but only abnormal results are displayed) Labs Reviewed - No data to display  EKG  EKG Interpretation None       Radiology No results found.  Procedures Procedures (including critical care time)  Medications Ordered in ED Medications - No data to display   Initial Impression / Assessment and Plan / ED Course  I have reviewed the triage vital signs and the nursing notes.  Pertinent labs & imaging results that were available during my care of the patient were reviewed by me and considered in my medical decision making  (see chart for details).  Clinical Course    Patient with neck and Campos pain similar to chronic pain. Patient with spasm to the left upper trapezius and right thoracic paraspinal muscles.  No neurological deficits and normal neuro exam.  Patient is ambulatory.  No loss of bowel or bladder control.  No concern for cauda equina.  No fever, night sweats, weight loss, h/o Kyle Campos, IVDA, no Kyle procedure to Campos. No urinary symptoms suggestive of UTI.  Discharged with Norflex, naprosyn.I offered the patient IV Robaxin and Toradol in the ED, however he chose to take the medications at home after he fell from the pharmacy. Supportive care and return precaution discussed. Appears safe for discharge at this time. Follow up with PCP this week. I discussed patient case with Dr. Roxanne Mins who guided the patient's management and agrees with plan.   Final Clinical Impressions(s) /  ED Diagnoses   Final diagnoses:  Chronic Campos pain  Neck pain    New Prescriptions Discharge Medication List as of 06/16/2016  6:00 AM    START taking these medications   Details  naproxen (NAPROSYN) 500 MG tablet Take 1 tablet (500 mg total) by mouth 2 (two) times daily., Starting Wed 06/16/2016, Print    orphenadrine (NORFLEX) 100 MG tablet Take 1 tablet (100 mg total) by mouth 2 (two) times daily as needed for muscle spasms., Starting Wed 06/16/2016, Print         Frederica Kuster, PA-C [id number]    David Glick, MD [id number]

## 2016-06-22 ENCOUNTER — Ambulatory Visit: Payer: Self-pay | Attending: Family Medicine | Admitting: Family Medicine

## 2016-06-22 ENCOUNTER — Encounter: Payer: Self-pay | Admitting: Family Medicine

## 2016-06-22 VITALS — BP 146/89 | HR 85 | Temp 98.1°F | Wt 227.4 lb

## 2016-06-22 DIAGNOSIS — M542 Cervicalgia: Secondary | ICD-10-CM

## 2016-06-22 DIAGNOSIS — M549 Dorsalgia, unspecified: Secondary | ICD-10-CM

## 2016-06-22 DIAGNOSIS — Z794 Long term (current) use of insulin: Secondary | ICD-10-CM

## 2016-06-22 DIAGNOSIS — E118 Type 2 diabetes mellitus with unspecified complications: Secondary | ICD-10-CM

## 2016-06-22 LAB — GLUCOSE, POCT (MANUAL RESULT ENTRY): POC Glucose: 144 mg/dl — AB (ref 70–99)

## 2016-06-22 LAB — POCT GLYCOSYLATED HEMOGLOBIN (HGB A1C): HEMOGLOBIN A1C: 6.1

## 2016-06-22 MED ORDER — NAPROXEN 500 MG PO TABS: 500.0000 mg | ORAL_TABLET | Freq: Two times a day (BID) | ORAL | 0 refills | Status: DC

## 2016-06-22 MED ORDER — CYCLOBENZAPRINE HCL 10 MG PO TABS: 10.0000 mg | ORAL_TABLET | Freq: Three times a day (TID) | ORAL | 0 refills | Status: DC

## 2016-06-22 NOTE — Patient Instructions (Addendum)
Kyle Campos was seen today for hospitalization follow-up.  Diagnoses and all orders for this visit:  Controlled type 2 diabetes mellitus with complication, with long-term current use of insulin (HCC) -     POCT glucose (manual entry) -     POCT glycosylated hemoglobin (Hb A1C)  Acute neck pain -     cyclobenzaprine (FLEXERIL) 10 MG tablet; Take 1 tablet (10 mg total) by mouth 3 (three) times daily. -     naproxen (NAPROSYN) 500 MG tablet; Take 1 tablet (500 mg total) by mouth 2 (two) times daily.  Acute back pain -     cyclobenzaprine (FLEXERIL) 10 MG tablet; Take 1 tablet (10 mg total) by mouth 3 (three) times daily. -     naproxen (NAPROSYN) 500 MG tablet; Take 1 tablet (500 mg total) by mouth 2 (two) times daily.  F/u in 5 weeks for low back and neck pain  Dr. Adrian Blackwater    Cervical Strain and Sprain With Rehab Cervical strain and sprain are injuries that commonly occur with "whiplash" injuries. Whiplash occurs when the neck is forcefully whipped backward or forward, such as during a motor vehicle accident or during contact sports. The muscles, ligaments, tendons, discs, and nerves of the neck are susceptible to injury when this occurs. RISK FACTORS Risk of having a whiplash injury increases if:  Osteoarthritis of the spine.  Situations that make head or neck accidents or trauma more likely.  High-risk sports (football, rugby, wrestling, hockey, auto racing, gymnastics, diving, contact karate, or boxing).  Poor strength and flexibility of the neck.  Previous neck injury.  Poor tackling technique.  Improperly fitted or padded equipment. SYMPTOMS   Pain or stiffness in the front or back of neck or both.  Symptoms may present immediately or up to 24 hours after injury.  Dizziness, headache, nausea, and vomiting.  Muscle spasm with soreness and stiffness in the neck.  Tenderness and swelling at the injury site. PREVENTION  Learn and use proper technique (avoid tackling with  the head, spearing, and head-butting; use proper falling techniques to avoid landing on the head).  Warm up and stretch properly before activity.  Maintain physical fitness:  Strength, flexibility, and endurance.  Cardiovascular fitness.  Wear properly fitted and padded protective equipment, such as padded soft collars, for participation in contact sports. PROGNOSIS  Recovery from cervical strain and sprain injuries is dependent on the extent of the injury. These injuries are usually curable in 1 week to 3 months with appropriate treatment.  RELATED COMPLICATIONS   Temporary numbness and weakness may occur if the nerve roots are damaged, and this may persist until the nerve has completely healed.  Chronic pain due to frequent recurrence of symptoms.  Prolonged healing, especially if activity is resumed too soon (before complete recovery). TREATMENT  Treatment initially involves the use of ice and medication to help reduce pain and inflammation. It is also important to perform strengthening and stretching exercises and modify activities that worsen symptoms so the injury does not get worse. These exercises may be performed at home or with a therapist. For patients who experience severe symptoms, a soft, padded collar may be recommended to be worn around the neck.  Improving your posture may help reduce symptoms. Posture improvement includes pulling your chin and abdomen in while sitting or standing. If you are sitting, sit in a firm chair with your buttocks against the back of the chair. While sleeping, try replacing your pillow with a small towel rolled to 2  inches in diameter, or use a cervical pillow or soft cervical collar. Poor sleeping positions delay healing.  For patients with nerve root damage, which causes numbness or weakness, the use of a cervical traction apparatus may be recommended. Surgery is rarely necessary for these injuries. However, cervical strain and sprains that are  present at birth (congenital) may require surgery. MEDICATION   If pain medication is necessary, nonsteroidal anti-inflammatory medications, such as aspirin and ibuprofen, or other minor pain relievers, such as acetaminophen, are often recommended.  Do not take pain medication for 7 days before surgery.  Prescription pain relievers may be given if deemed necessary by your caregiver. Use only as directed and only as much as you need. HEAT AND COLD:   Cold treatment (icing) relieves pain and reduces inflammation. Cold treatment should be applied for 10 to 15 minutes every 2 to 3 hours for inflammation and pain and immediately after any activity that aggravates your symptoms. Use ice packs or an ice massage.  Heat treatment may be used prior to performing the stretching and strengthening activities prescribed by your caregiver, physical therapist, or athletic trainer. Use a heat pack or a warm soak. SEEK MEDICAL CARE IF:   Symptoms get worse or do not improve in 2 weeks despite treatment.  New, unexplained symptoms develop (drugs used in treatment may produce side effects). EXERCISES RANGE OF MOTION (ROM) AND STRETCHING EXERCISES - Cervical Strain and Sprain These exercises may help you when beginning to rehabilitate your injury. In order to successfully resolve your symptoms, you must improve your posture. These exercises are designed to help reduce the forward-head and rounded-shoulder posture which contributes to this condition. Your symptoms may resolve with or without further involvement from your physician, physical therapist or athletic trainer. While completing these exercises, remember:   Restoring tissue flexibility helps normal motion to return to the joints. This allows healthier, less painful movement and activity.  An effective stretch should be held for at least 20 seconds, although you may need to begin with shorter hold times for comfort.  A stretch should never be painful.  You should only feel a gentle lengthening or release in the stretched tissue. STRETCH- Axial Extensors  Lie on your back on the floor. You may bend your knees for comfort. Place a rolled-up hand towel or dish towel, about 2 inches in diameter, under the part of your head that makes contact with the floor.  Gently tuck your chin, as if trying to make a "double chin," until you feel a gentle stretch at the base of your head.  Hold __________ seconds. Repeat __________ times. Complete this exercise __________ times per day.  STRETCH - Axial Extension   Stand or sit on a firm surface. Assume a good posture: chest up, shoulders drawn back, abdominal muscles slightly tense, knees unlocked (if standing) and feet hip width apart.  Slowly retract your chin so your head slides back and your chin slightly lowers. Continue to look straight ahead.  You should feel a gentle stretch in the back of your head. Be certain not to feel an aggressive stretch since this can cause headaches later.  Hold for __________ seconds. Repeat __________ times. Complete this exercise __________ times per day. STRETCH - Cervical Side Bend   Stand or sit on a firm surface. Assume a good posture: chest up, shoulders drawn back, abdominal muscles slightly tense, knees unlocked (if standing) and feet hip width apart.  Without letting your nose or shoulders move, slowly tip  your right / left ear to your shoulder until your feel a gentle stretch in the muscles on the opposite side of your neck.  Hold __________ seconds. Repeat __________ times. Complete this exercise __________ times per day. STRETCH - Cervical Rotators   Stand or sit on a firm surface. Assume a good posture: chest up, shoulders drawn back, abdominal muscles slightly tense, knees unlocked (if standing) and feet hip width apart.  Keeping your eyes level with the ground, slowly turn your head until you feel a gentle stretch along the back and opposite side of  your neck.  Hold __________ seconds. Repeat __________ times. Complete this exercise __________ times per day. RANGE OF MOTION - Neck Circles   Stand or sit on a firm surface. Assume a good posture: chest up, shoulders drawn back, abdominal muscles slightly tense, knees unlocked (if standing) and feet hip width apart.  Gently roll your head down and around from the back of one shoulder to the back of the other. The motion should never be forced or painful.  Repeat the motion 10-20 times, or until you feel the neck muscles relax and loosen. Repeat __________ times. Complete the exercise __________ times per day. STRENGTHENING EXERCISES - Cervical Strain and Sprain These exercises may help you when beginning to rehabilitate your injury. They may resolve your symptoms with or without further involvement from your physician, physical therapist, or athletic trainer. While completing these exercises, remember:   Muscles can gain both the endurance and the strength needed for everyday activities through controlled exercises.  Complete these exercises as instructed by your physician, physical therapist, or athletic trainer. Progress the resistance and repetitions only as guided.  You may experience muscle soreness or fatigue, but the pain or discomfort you are trying to eliminate should never worsen during these exercises. If this pain does worsen, stop and make certain you are following the directions exactly. If the pain is still present after adjustments, discontinue the exercise until you can discuss the trouble with your clinician. STRENGTH - Cervical Flexors, Isometric  Face a wall, standing about 6 inches away. Place a small pillow, a ball about 6-8 inches in diameter, or a folded towel between your forehead and the wall.  Slightly tuck your chin and gently push your forehead into the soft object. Push only with mild to moderate intensity, building up tension gradually. Keep your jaw and  forehead relaxed.  Hold 10 to 20 seconds. Keep your breathing relaxed.  Release the tension slowly. Relax your neck muscles completely before you start the next repetition. Repeat __________ times. Complete this exercise __________ times per day. STRENGTH- Cervical Lateral Flexors, Isometric   Stand about 6 inches away from a wall. Place a small pillow, a ball about 6-8 inches in diameter, or a folded towel between the side of your head and the wall.  Slightly tuck your chin and gently tilt your head into the soft object. Push only with mild to moderate intensity, building up tension gradually. Keep your jaw and forehead relaxed.  Hold 10 to 20 seconds. Keep your breathing relaxed.  Release the tension slowly. Relax your neck muscles completely before you start the next repetition. Repeat __________ times. Complete this exercise __________ times per day. STRENGTH - Cervical Extensors, Isometric   Stand about 6 inches away from a wall. Place a small pillow, a ball about 6-8 inches in diameter, or a folded towel between the back of your head and the wall.  Slightly tuck your chin and  gently tilt your head back into the soft object. Push only with mild to moderate intensity, building up tension gradually. Keep your jaw and forehead relaxed.  Hold 10 to 20 seconds. Keep your breathing relaxed.  Release the tension slowly. Relax your neck muscles completely before you start the next repetition. Repeat __________ times. Complete this exercise __________ times per day. POSTURE AND BODY MECHANICS CONSIDERATIONS - Cervical Strain and Sprain Keeping correct posture when sitting, standing or completing your activities will reduce the stress put on different body tissues, allowing injured tissues a chance to heal and limiting painful experiences. The following are general guidelines for improved posture. Your physician or physical therapist will provide you with any instructions specific to your  needs. While reading these guidelines, remember:  The exercises prescribed by your provider will help you have the flexibility and strength to maintain correct postures.  The correct posture provides the optimal environment for your joints to work. All of your joints have less wear and tear when properly supported by a spine with good posture. This means you will experience a healthier, less painful body.  Correct posture must be practiced with all of your activities, especially prolonged sitting and standing. Correct posture is as important when doing repetitive low-stress activities (typing) as it is when doing a single heavy-load activity (lifting). PROLONGED STANDING WHILE SLIGHTLY LEANING FORWARD When completing a task that requires you to lean forward while standing in one place for a long time, place either foot up on a stationary 2- to 4-inch high object to help maintain the best posture. When both feet are on the ground, the low back tends to lose its slight inward curve. If this curve flattens (or becomes too large), then the back and your other joints will experience too much stress, fatigue more quickly, and can cause pain.  RESTING POSITIONS Consider which positions are most painful for you when choosing a resting position. If you have pain with flexion-based activities (sitting, bending, stooping, squatting), choose a position that allows you to rest in a less flexed posture. You would want to avoid curling into a fetal position on your side. If your pain worsens with extension-based activities (prolonged standing, working overhead), avoid resting in an extended position such as sleeping on your stomach. Most people will find more comfort when they rest with their spine in a more neutral position, neither too rounded nor too arched. Lying on a non-sagging bed on your side with a pillow between your knees, or on your back with a pillow under your knees will often provide some relief. Keep in  mind, being in any one position for a prolonged period of time, no matter how correct your posture, can still lead to stiffness. WALKING Walk with an upright posture. Your ears, shoulders, and hips should all line up. OFFICE WORK When working at a desk, create an environment that supports good, upright posture. Without extra support, muscles fatigue and lead to excessive strain on joints and other tissues. CHAIR:  A chair should be able to slide under your desk when your back makes contact with the back of the chair. This allows you to work closely.  The chair's height should allow your eyes to be level with the upper part of your monitor and your hands to be slightly lower than your elbows.  Body position:  Your feet should make contact with the floor. If this is not possible, use a foot rest.  Keep your ears over your shoulders. This  will reduce stress on your neck and low back.   This information is not intended to replace advice given to you by your health care provider. Make sure you discuss any questions you have with your health care provider.   Document Released: 09/20/2005 Document Revised: 10/11/2014 Document Reviewed: 01/02/2009 Elsevier Interactive Patient Education 2016 [place].  Back Exercises If you have pain in your back, do these exercises 2-3 times each day or as told by your doctor. When the pain goes away, do the exercises once each day, but repeat the steps more times for each exercise (do more repetitions). If you do not have pain in your back, do these exercises once each day or as told by your doctor. EXERCISES Single Knee to Chest Do these steps 3-5 times in a row for each leg: 1. Lie on your back on a firm bed or the floor with your legs stretched out. 2. Bring one knee to your chest. 3. Hold your knee to your chest by grabbing your knee or thigh. 4. Pull on your knee until you feel a gentle stretch in your lower back. 5. Keep doing the stretch for 10-30  seconds. 6. Slowly let go of your leg and straighten it. Pelvic Tilt Do these steps 5-10 times in a row: 1. Lie on your back on a firm bed or the floor with your legs stretched out. 2. Bend your knees so they point up to the ceiling. Your feet should be flat on the floor. 3. Tighten your lower belly (abdomen) muscles to press your lower back against the floor. This will make your tailbone point up to the ceiling instead of pointing down to your feet or the floor. 4. Stay in this position for 5-10 seconds while you gently tighten your muscles and breathe evenly. Cat-Cow Do these steps until your lower back bends more easily: 1. Get on your hands and knees on a firm surface. Keep your hands under your shoulders, and keep your knees under your hips. You may put padding under your knees. 2. Let your head hang down, and make your tailbone point down to the floor so your lower back is round like the back of a cat. 3. Stay in this position for 5 seconds. 4. Slowly lift your head and make your tailbone point up to the ceiling so your back hangs low (sags) like the back of a cow. 5. Stay in this position for 5 seconds. Press-Ups Do these steps 5-10 times in a row: 1. Lie on your belly (face-down) on the floor. 2. Place your hands near your head, about shoulder-width apart. 3. While you keep your back relaxed and keep your hips on the floor, slowly straighten your arms to raise the top half of your body and lift your shoulders. Do not use your back muscles. To make yourself more comfortable, you may change where you place your hands. 4. Stay in this position for 5 seconds. 5. Slowly return to lying flat on the floor. Bridges Do these steps 10 times in a row: 1. Lie on your back on a firm surface. 2. Bend your knees so they point up to the ceiling. Your feet should be flat on the floor. 3. Tighten your butt muscles and lift your butt off of the floor until your waist is almost as high as your knees.  If you do not feel the muscles working in your butt and the back of your thighs, slide your feet 1-2 inches farther away  from your butt. 4. Stay in this position for 3-5 seconds. 5. Slowly lower your butt to the floor, and let your butt muscles relax. If this exercise is too easy, try doing it with your arms crossed over your chest. Belly Crunches Do these steps 5-10 times in a row: 1. Lie on your back on a firm bed or the floor with your legs stretched out. 2. Bend your knees so they point up to the ceiling. Your feet should be flat on the floor. 3. Cross your arms over your chest. 4. Tip your chin a little bit toward your chest but do not bend your neck. 5. Tighten your belly muscles and slowly raise your chest just enough to lift your shoulder blades a tiny bit off of the floor. 6. Slowly lower your chest and your head to the floor. Back Lifts Do these steps 5-10 times in a row: 1. Lie on your belly (face-down) with your arms at your sides, and rest your forehead on the floor. 2. Tighten the muscles in your legs and your butt. 3. Slowly lift your chest off of the floor while you keep your hips on the floor. Keep the back of your head in line with the curve in your back. Look at the floor while you do this. 4. Stay in this position for 3-5 seconds. 5. Slowly lower your chest and your face to the floor. GET HELP IF:  Your back pain gets a lot worse when you do an exercise.  Your back pain does not lessen 2 hours after you exercise. If you have any of these problems, stop doing the exercises. Do not do them again unless your doctor says it is okay. GET HELP RIGHT AWAY IF:  You have sudden, very bad back pain. If this happens, stop doing the exercises. Do not do them again unless your doctor says it is okay.   This information is not intended to replace advice given to you by your health care provider. Make sure you discuss any questions you have with your health care provider.    Document Released: 10/23/2010 Document Revised: 06/11/2015 Document Reviewed: 11/14/2014 Else[employer] Interactive Patient Education [employer].

## 2016-06-22 NOTE — Progress Notes (Signed)
Subjective:  Patient ID: Kyle Campos, male    DOB: August 26, 1956  Age: 60 y.o. MRN: [id number]  CC: Hospitalization Follow-up (ED - Chronic Back and neck pain)   HPI Kyle Campos has diabetes and is a smoker he presents for    1. Neck and back pain:  X one week. No hx of chronic neck or back pain. No previous injuries. No heavy lifting. Having aching pain in neck that was sharp that led him to the ED. Also has stiffness and popping sensation when he turns his neck. His low back pain is a constant pain. No burning. No pain radiating down bottom or legs.    Social History  Substance Use Topics  . Smoking status: Current Every Day Smoker    Packs/day: 1.00    Years: 40.00  . Smokeless tobacco: Never Used  . Alcohol use 0.0 oz/week     Comment: once monthly   Outpatient Medications Prior to Visit  Medication Sig Dispense Refill  . naproxen (NAPROSYN) 500 MG tablet Take 1 tablet (500 mg total) by mouth 2 (two) times daily. 20 tablet 0  . orphenadrine (NORFLEX) 100 MG tablet Take 1 tablet (100 mg total) by mouth 2 (two) times daily as needed for muscle spasms. (Patient not taking: Reported on 06/22/2016) 14 tablet 0  . ciprofloxacin (CIPRO) 500 MG tablet Take 1 tablet (500 mg total) by mouth 2 (two) times daily. (Patient not taking: Reported on 06/22/2016) 14 tablet 0  . hyoscyamine (LEVSIN SL) 0.125 MG SL tablet Dissolve 1 tablet under the tongue every 4-6 hours as needed (Patient not taking: Reported on 06/22/2016) 30 tablet 0  . metroNIDAZOLE (FLAGYL) 250 MG tablet Take 1 tablet (250 mg total) by mouth 3 (three) times daily. (Patient not taking: Reported on 06/22/2016) 21 tablet 0   No facility-administered medications prior to visit.     ROS Review of Systems  Constitutional: Negative for chills, fatigue, fever and unexpected weight change.  Eyes: Negative for visual disturbance.  Respiratory: Negative for cough and shortness of breath.   Cardiovascular: Negative for chest pain,  palpitations and leg swelling.  Gastrointestinal: Negative for abdominal pain, blood in stool, constipation, diarrhea, nausea and vomiting.  Endocrine: Negative for polydipsia, polyphagia and polyuria.  Musculoskeletal: Positive for back pain and neck pain. Negative for arthralgias, gait problem and myalgias.  Skin: Negative for rash.  Allergic/Immunologic: Negative for immunocompromised state.  Hematological: Negative for adenopathy. Does not bruise/bleed easily.  Psychiatric/Behavioral: Negative for dysphoric mood, sleep disturbance and suicidal ideas. The patient is not nervous/anxious.     Objective:  BP (!) 146/89 (BP Location: Right Arm, Patient Position: Sitting, Cuff Size: Large)   Pulse 85   Temp 98.1 F (36.7 C) (Oral)   Wt 227 lb 6.4 oz (103.1 kg)   BMI 30.00 kg/m   BP/Weight 06/22/2016 06/16/2016 [id number]  Systolic BP 123456 [id number]  Diastolic BP 89 67 80  Wt. (Lbs) 227.4 - 197  BMI 30 - 26   Wt Readings from Last 3 Encounters:  06/22/16 227 lb 6.4 oz (103.1 kg)  09/23/15 197 lb (89.4 kg)  06/19/15 208 lb (94.3 kg)    Physical Exam  Constitutional: He appears well-developed and well-nourished. No distress.  HENT:  Head: Normocephalic and atraumatic.  Neck: Normal range of motion. Neck supple.  Cardiovascular: Normal rate, regular rhythm, normal heart sounds and intact distal pulses.   Pulmonary/Chest: Effort normal and breath sounds normal.  Musculoskeletal: He exhibits no edema.  Back Exam: Back: Normal Curvature, no deformities or CVA tenderness  Paraspinal Tenderness: present   LE Strength 5/5  LE Sensation: in tact  LE Reflexes 2+ and symmetric  Straight leg raise: negative    Neurological: He is alert.  Skin: Skin is warm and dry. No rash noted. No erythema.  Psychiatric: He has a normal mood and affect.   Lab Results  Component Value Date   HGBA1C 6.1 06/22/2016    Assessment & Plan:  Staffon was seen today for hospitalization  follow-up.  Diagnoses and all orders for this visit:  Controlled type 2 diabetes mellitus with complication, with long-term current use of insulin (HCC) -     POCT glucose (manual entry) -     POCT glycosylated hemoglobin (Hb A1C)  Acute neck pain -     cyclobenzaprine (FLEXERIL) 10 MG tablet; Take 1 tablet (10 mg total) by mouth 3 (three) times daily. -     naproxen (NAPROSYN) 500 MG tablet; Take 1 tablet (500 mg total) by mouth 2 (two) times daily.  Acute back pain -     cyclobenzaprine (FLEXERIL) 10 MG tablet; Take 1 tablet (10 mg total) by mouth 3 (three) times daily. -     naproxen (NAPROSYN) 500 MG tablet; Take 1 tablet (500 mg total) by mouth 2 (two) times daily.  Other orders -     Cancel: Ambulatory referral to Obstetrics / Gynecology   There are no diagnoses linked to this encounter.  No orders of the defined types were placed in this encounter.   Follow-up: Return in about 5 weeks (around 07/27/2016) for neck and back pain .   Boykin Nearing MD

## 2016-06-27 DIAGNOSIS — G8929 Other chronic pain: Secondary | ICD-10-CM | POA: Insufficient documentation

## 2016-06-27 DIAGNOSIS — M542 Cervicalgia: Secondary | ICD-10-CM

## 2016-06-27 DIAGNOSIS — M549 Dorsalgia, unspecified: Secondary | ICD-10-CM | POA: Insufficient documentation

## 2016-06-27 NOTE — Assessment & Plan Note (Signed)
Low back pain without radiculopathy or red flags, suspect MSK pain due to weight gain   Plan: Flexeril and naproxen prn Home PT Close f/u in 5 weeks

## 2016-06-27 NOTE — Assessment & Plan Note (Signed)
Neck pain with elevated BP suspect MSK pain   Plan: Flexeril and naproxen prn Home PT Close f/u in 5 weeks

## 2016-06-29 ENCOUNTER — Ambulatory Visit: Payer: Self-pay | Admitting: Family Medicine

## 2016-09-14 ENCOUNTER — Encounter: Payer: Self-pay | Admitting: Family Medicine

## 2016-09-14 ENCOUNTER — Ambulatory Visit: Payer: Self-pay | Attending: Family Medicine | Admitting: Family Medicine

## 2016-09-14 VITALS — BP 141/83 | HR 76 | Temp 98.5°F | Ht 73.0 in | Wt 230.6 lb

## 2016-09-14 DIAGNOSIS — E119 Type 2 diabetes mellitus without complications: Secondary | ICD-10-CM | POA: Insufficient documentation

## 2016-09-14 DIAGNOSIS — F17210 Nicotine dependence, cigarettes, uncomplicated: Secondary | ICD-10-CM | POA: Insufficient documentation

## 2016-09-14 DIAGNOSIS — M546 Pain in thoracic spine: Secondary | ICD-10-CM

## 2016-09-14 DIAGNOSIS — I10 Essential (primary) hypertension: Secondary | ICD-10-CM

## 2016-09-14 DIAGNOSIS — Z23 Encounter for immunization: Secondary | ICD-10-CM

## 2016-09-14 DIAGNOSIS — Z794 Long term (current) use of insulin: Secondary | ICD-10-CM

## 2016-09-14 DIAGNOSIS — M542 Cervicalgia: Secondary | ICD-10-CM

## 2016-09-14 DIAGNOSIS — Z1159 Encounter for screening for other viral diseases: Secondary | ICD-10-CM

## 2016-09-14 DIAGNOSIS — G8929 Other chronic pain: Secondary | ICD-10-CM

## 2016-09-14 DIAGNOSIS — Z114 Encounter for screening for human immunodeficiency virus [HIV]: Secondary | ICD-10-CM

## 2016-09-14 DIAGNOSIS — E118 Type 2 diabetes mellitus with unspecified complications: Secondary | ICD-10-CM

## 2016-09-14 LAB — POCT GLYCOSYLATED HEMOGLOBIN (HGB A1C): HEMOGLOBIN A1C: 6.2

## 2016-09-14 LAB — GLUCOSE, POCT (MANUAL RESULT ENTRY): POC GLUCOSE: 123 mg/dL — AB (ref 70–99)

## 2016-09-14 MED ORDER — LISINOPRIL 10 MG PO TABS: 10.0000 mg | ORAL_TABLET | Freq: Every day | ORAL | 2 refills | Status: DC

## 2016-09-14 MED ORDER — CELECOXIB 100 MG PO CAPS: 100.0000 mg | ORAL_CAPSULE | Freq: Two times a day (BID) | ORAL | 2 refills | Status: DC

## 2016-09-14 MED ORDER — CYCLOBENZAPRINE HCL 10 MG PO TABS: 10.0000 mg | ORAL_TABLET | Freq: Every evening | ORAL | 2 refills | Status: DC | PRN

## 2016-09-14 MED FILL — ?LISINOPRIL 10 MG TABLET: 10 | 30 days supply | Qty: 30 | Fill #0

## 2016-09-14 MED FILL — CYCLOBENZAPRINE 10 MG TAB: 10 | 30 days supply | Qty: 30 | Fill #0

## 2016-09-14 NOTE — Patient Instructions (Addendum)
Kyle Campos was seen today for diabetes.  Diagnoses and all orders for this visit:  Controlled type 2 diabetes mellitus with complication, with long-term current use of insulin (HCC) -     POCT glucose (manual entry) -     POCT glycosylated hemoglobin (Hb A1C)  Essential hypertension -     lisinopril (PRINIVIL,ZESTRIL) 10 MG tablet; Take 1 tablet (10 mg total) by mouth daily.  Chronic midline thoracic back pain -     celecoxib (CELEBREX) 100 MG capsule; Take 1 capsule (100 mg total) by mouth 2 (two) times daily. -     cyclobenzaprine (FLEXERIL) 10 MG tablet; Take 1 tablet (10 mg total) by mouth at bedtime as needed for muscle spasms.  Chronic midline posterior neck pain -     DG Cervical Spine Complete; Future -     celecoxib (CELEBREX) 100 MG capsule; Take 1 capsule (100 mg total) by mouth 2 (two) times daily. -     cyclobenzaprine (FLEXERIL) 10 MG tablet; Take 1 tablet (10 mg total) by mouth at bedtime as needed for muscle spasms.   Complete your neck x-ray at your earliest convenience  F/u in 6 week for neck and thoracic back pain  Dr. Adrian Blackwater

## 2016-09-14 NOTE — Progress Notes (Signed)
Pt is here today to follow up on diabetes. 

## 2016-09-14 NOTE — Progress Notes (Signed)
Subjective:  Patient ID: Kyle Campos, male    DOB: 1956-03-23  Age: 60 y.o. MRN: [id number]  CC: Diabetes   HPI Kyle Campos presents for    1. CHRONIC DIABETES  Disease Monitoring  Blood Sugar Ranges: not checking   Polyuria: no   Visual problems: no   Medication Compliance: no, diet controlled   Medication Side Effects  Hypoglycemia: no   2. HTN: persistently elevated BP. No HA. Has neck pain. No CP, SOB or leg swellling.  3. Chronic pain: in neck and thoracic back. Midline. Ongoing for past 3 months. Naproxen and flexeril helped a bit, but pain persist. No pain or weakness in arms.   Social History  Substance Use Topics  . Smoking status: Current Every Day Smoker    Packs/day: 1.00    Years: 40.00  . Smokeless tobacco: Never Used  . Alcohol use 0.0 oz/week     Comment: once monthly    Outpatient Medications Prior to Visit  Medication Sig Dispense Refill  . cyclobenzaprine (FLEXERIL) 10 MG tablet Take 1 tablet (10 mg total) by mouth 3 (three) times daily. 30 tablet 0  . naproxen (NAPROSYN) 500 MG tablet Take 1 tablet (500 mg total) by mouth 2 (two) times daily. 30 tablet 0   No facility-administered medications prior to visit.     ROS Review of Systems  Constitutional: Negative for chills, fatigue, fever and unexpected weight change.  Eyes: Negative for visual disturbance.  Respiratory: Negative for cough and shortness of breath.   Cardiovascular: Negative for chest pain, palpitations and leg swelling.  Gastrointestinal: Negative for abdominal pain, blood in stool, constipation, diarrhea, nausea and vomiting.  Endocrine: Negative for polydipsia, polyphagia and polyuria.  Musculoskeletal: Positive for back pain and neck pain. Negative for arthralgias, gait problem and myalgias.  Skin: Negative for rash.  Allergic/Immunologic: Negative for immunocompromised state.  Hematological: Negative for adenopathy. Does not bruise/bleed easily.    Psychiatric/Behavioral: Negative for dysphoric mood, sleep disturbance and suicidal ideas. The patient is not nervous/anxious.     Objective:  BP (!) 141/83 (BP Location: Left Arm, Patient Position: Sitting, Cuff Size: Small)   Pulse 76   Temp 98.5 F (36.9 C) (Oral)   Ht 6\' 1"  (1.854 m)   Wt 230 lb 9.6 oz (104.6 kg)   SpO2 93%   BMI 30.42 kg/m   BP/Weight 09/14/2016 06/22/2016 123456  Systolic BP [id number] 123456 [id number]  Diastolic BP 83 89 67  Wt. (Lbs) 230.6 227.4 -  BMI 30.42 30 -      Physical Exam  Constitutional: He appears well-developed and well-nourished. No distress.  HENT:  Head: Normocephalic and atraumatic.  Neck: Normal range of motion. Neck supple. Muscular tenderness present. No spinous process tenderness present. No neck rigidity. No edema, no erythema and normal range of motion present.  Cardiovascular: Normal rate, regular rhythm, normal heart sounds and intact distal pulses.   Pulmonary/Chest: Effort normal and breath sounds normal.  Musculoskeletal: He exhibits no edema.       Cervical back: He exhibits tenderness.       Thoracic back: He exhibits tenderness.  Neurological: He is alert.  Skin: Skin is warm and dry. No rash noted. No erythema.  Psychiatric: He has a normal mood and affect.    Lab Results  Component Value Date   HGBA1C 6.1 06/22/2016   Lab Results  Component Value Date   HGBA1C 6.2 09/14/2016    CBG 123  Assessment & Plan:  Kyle Campos was seen today for diabetes.  Diagnoses and all orders for this visit:  Controlled type 2 diabetes mellitus with complication, with long-term current use of insulin (HCC) -     POCT glucose (manual entry) -     POCT glycosylated hemoglobin (Hb A1C) -     Microalbumin/Creatinine Ratio, Urine  Essential hypertension -     lisinopril (PRINIVIL,ZESTRIL) 10 MG tablet; Take 1 tablet (10 mg total) by mouth daily.  Chronic midline thoracic back pain -     celecoxib (CELEBREX) 100 MG capsule; Take 1 capsule  (100 mg total) by mouth 2 (two) times daily. -     cyclobenzaprine (FLEXERIL) 10 MG tablet; Take 1 tablet (10 mg total) by mouth at bedtime as needed for muscle spasms. -     ANA,IFA RA Diag Pnl w/rflx Tit/Patn -     Uric Acid -     Turmeric 500 MG CAPS; Take 500 mg by mouth daily. -     Glucosamine-Chondroitin 750-600 MG TABS; Take 2 tablets by mouth daily.  Chronic midline posterior neck pain -     DG Cervical Spine Complete; Future -     celecoxib (CELEBREX) 100 MG capsule; Take 1 capsule (100 mg total) by mouth 2 (two) times daily. -     cyclobenzaprine (FLEXERIL) 10 MG tablet; Take 1 tablet (10 mg total) by mouth at bedtime as needed for muscle spasms. -     ANA,IFA RA Diag Pnl w/rflx Tit/Patn -     Uric Acid -     Turmeric 500 MG CAPS; Take 500 mg by mouth daily. -     Glucosamine-Chondroitin 750-600 MG TABS; Take 2 tablets by mouth daily.  Encounter for screening for HIV -     HIV antibody (with reflex)  Need for hepatitis C screening test -     Hepatitis C antibody, reflex  Encounter for immunization -     POCT glucose (manual entry) -     POCT glycosylated hemoglobin (Hb A1C) -     lisinopril (PRINIVIL,ZESTRIL) 10 MG tablet; Take 1 tablet (10 mg total) by mouth daily. -     DG Cervical Spine Complete; Future -     celecoxib (CELEBREX) 100 MG capsule; Take 1 capsule (100 mg total) by mouth 2 (two) times daily. -     cyclobenzaprine (FLEXERIL) 10 MG tablet; Take 1 tablet (10 mg total) by mouth at bedtime as needed for muscle spasms. -     HIV antibody (with reflex) -     Hepatitis C antibody, reflex -     ANA,IFA RA Diag Pnl w/rflx Tit/Patn -     Uric Acid -     Microalbumin/Creatinine Ratio, Urine -     Flu Vaccine QUAD 36+ mos IM -     Hepatitis C antibody   No orders of the defined types were placed in this encounter.   Follow-up: Return in about 6 weeks (around 10/26/2016) for BP check, neck and back pain .   Boykin Nearing MD

## 2016-09-15 ENCOUNTER — Ambulatory Visit (HOSPITAL_COMMUNITY)
Admission: RE | Admit: 2016-09-15 | Discharge: 2016-09-15 | Disposition: A | Discharge status: Home / Self Care | Payer: Self-pay | Source: Ambulatory Visit | Attending: Family Medicine | Admitting: Family Medicine

## 2016-09-15 DIAGNOSIS — G8929 Other chronic pain: Secondary | ICD-10-CM | POA: Insufficient documentation

## 2016-09-15 DIAGNOSIS — M542 Cervicalgia: Secondary | ICD-10-CM | POA: Insufficient documentation

## 2016-09-15 DIAGNOSIS — M546 Pain in thoracic spine: Secondary | ICD-10-CM

## 2016-09-15 DIAGNOSIS — I10 Essential (primary) hypertension: Secondary | ICD-10-CM | POA: Insufficient documentation

## 2016-09-15 LAB — URIC ACID: URIC ACID, SERUM: 6.3 mg/dL (ref 4.0–8.0)

## 2016-09-15 LAB — HEPATITIS C ANTIBODY: HCV Ab: NEGATIVE

## 2016-09-15 LAB — ANA,IFA RA DIAG PNL W/RFLX TIT/PATN: Anti Nuclear Antibody(ANA): NEGATIVE

## 2016-09-15 LAB — HIV ANTIBODY (ROUTINE TESTING W REFLEX): HIV: NONREACTIVE

## 2016-09-15 LAB — MICROALBUMIN / CREATININE URINE RATIO
CREATININE, URINE: 145 mg/dL (ref 20–370)
Microalb Creat Ratio: 10 mcg/mg creat (ref <30)
Microalb, Ur: 1.5 mg/dL

## 2016-09-15 MED ORDER — GLUCOSAMINE-CHONDROITIN 750-600 MG PO TABS: 2.0000 | ORAL_TABLET | Freq: Every day | ORAL | 0 refills | Status: DC

## 2016-09-15 MED ORDER — TURMERIC 500 MG PO CAPS: 500.0000 mg | ORAL_CAPSULE | Freq: Every day | ORAL | Status: DC

## 2016-09-15 NOTE — Assessment & Plan Note (Signed)
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

## 2016-09-15 NOTE — Assessment & Plan Note (Signed)
Start lisinopril for HTN

## 2016-09-15 NOTE — Assessment & Plan Note (Signed)
Diet controlled.  

## 2016-09-20 ENCOUNTER — Telehealth: Payer: Self-pay | Admitting: Family Medicine

## 2016-09-20 NOTE — Telephone Encounter (Signed)
Results have not came in yet, will call patient once results come in.

## 2016-09-20 NOTE — Telephone Encounter (Signed)
Patient called the office to follow up on the results of the xrays that he got done last week. Please follow up.    Thank you.

## 2016-11-12 MED FILL — LISINOPRIL 10 MG TABLET: 10 | 30 days supply | Qty: 30 | Fill #1

## 2016-12-02 ENCOUNTER — Ambulatory Visit: Payer: Self-pay | Admitting: Family Medicine

## 2016-12-03 ENCOUNTER — Ambulatory Visit: Payer: Self-pay | Attending: Family Medicine | Admitting: Family Medicine

## 2016-12-03 ENCOUNTER — Encounter: Payer: Self-pay | Admitting: Family Medicine

## 2016-12-03 VITALS — BP 121/78 | HR 113 | Temp 98.1°F | Resp 16 | Wt 229.8 lb

## 2016-12-03 DIAGNOSIS — Z794 Long term (current) use of insulin: Secondary | ICD-10-CM

## 2016-12-03 DIAGNOSIS — Z7984 Long term (current) use of oral hypoglycemic drugs: Secondary | ICD-10-CM | POA: Insufficient documentation

## 2016-12-03 DIAGNOSIS — F17210 Nicotine dependence, cigarettes, uncomplicated: Secondary | ICD-10-CM | POA: Insufficient documentation

## 2016-12-03 DIAGNOSIS — M546 Pain in thoracic spine: Secondary | ICD-10-CM | POA: Insufficient documentation

## 2016-12-03 DIAGNOSIS — G8929 Other chronic pain: Secondary | ICD-10-CM

## 2016-12-03 DIAGNOSIS — M542 Cervicalgia: Secondary | ICD-10-CM | POA: Insufficient documentation

## 2016-12-03 DIAGNOSIS — E119 Type 2 diabetes mellitus without complications: Secondary | ICD-10-CM | POA: Insufficient documentation

## 2016-12-03 DIAGNOSIS — Z79899 Other long term (current) drug therapy: Secondary | ICD-10-CM | POA: Insufficient documentation

## 2016-12-03 DIAGNOSIS — I10 Essential (primary) hypertension: Secondary | ICD-10-CM

## 2016-12-03 DIAGNOSIS — E118 Type 2 diabetes mellitus with unspecified complications: Secondary | ICD-10-CM

## 2016-12-03 LAB — GLUCOSE, POCT (MANUAL RESULT ENTRY): POC Glucose: 215 mg/dl — AB (ref 70–99)

## 2016-12-03 MED ORDER — RANITIDINE HCL 75 MG PO TABS: 75.0000 mg | ORAL_TABLET | Freq: Two times a day (BID) | ORAL | 2 refills | Status: DC

## 2016-12-03 MED ORDER — METFORMIN HCL ER 500 MG PO TB24: 500.0000 mg | ORAL_TABLET | Freq: Every day | ORAL | 2 refills | Status: DC

## 2016-12-03 MED ORDER — CELECOXIB 200 MG PO CAPS: 200.0000 mg | ORAL_CAPSULE | Freq: Two times a day (BID) | ORAL | 2 refills | Status: DC

## 2016-12-03 NOTE — Progress Notes (Signed)
Subjective:  Patient ID: Kyle Campos, male    DOB: 1956/07/08  Age: 61 y.o. MRN: [id number]  CC: Follow-up and Hypertension   HPI Kyle Campos presents for    1. CHRONIC DIABETES  Disease Monitoring  Blood Sugar Ranges: not checking   Polyuria: no   Visual problems: no   Medication Compliance: no, diet controlled   Medication Side Effects  Hypoglycemia: no   2. HTN: persistently elevated BP. No HA. Has neck pain. No CP, SOB or leg swellling.  3. Chronic pain: in neck and thoracic back. Midline. Ongoing for past 3 months. Naproxen and flexeril helped a bit, but pain persist. No pain or weakness in arms. He did not get a chance to try Celebrex. Reports that he felt intoxicated with flexeril.  DG cervical spine 09/14/2016: IMPRESSION: No acute fracture or subluxation. 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 No prevertebral soft tissue swelling.  Social History  Substance Use Topics  . Smoking status: Current Every Day Smoker    Packs/day: 1.00    Years: 40.00  . Smokeless tobacco: Never Used  . Alcohol use 0.0 oz/week     Comment: once monthly    Outpatient Medications Prior to Visit  Medication Sig Dispense Refill  . lisinopril (PRINIVIL,ZESTRIL) 10 MG tablet Take 1 tablet (10 mg total) by mouth daily. 30 tablet 2  . celecoxib (CELEBREX) 100 MG capsule Take 1 capsule (100 mg total) by mouth 2 (two) times daily. (Patient not taking: Reported on 12/03/2016) 60 capsule 2  . cyclobenzaprine (FLEXERIL) 10 MG tablet Take 1 tablet (10 mg total) by mouth at bedtime as needed for muscle spasms. (Patient not taking: Reported on 12/03/2016) 30 tablet 2  . Glucosamine-Chondroitin 750-600 MG TABS Take 2 tablets by mouth daily. (Patient not taking: Reported on 12/03/2016)  0  . Turmeric 500 MG CAPS Take 500 mg by mouth daily. (Patient not taking: Reported on 12/03/2016)     No facility-administered medications prior to visit.     ROS Review of Systems  Constitutional: Negative  for chills, fatigue, fever and unexpected weight change.  Eyes: Negative for visual disturbance.  Respiratory: Negative for cough and shortness of breath.   Cardiovascular: Negative for chest pain, palpitations and leg swelling.  Gastrointestinal: Negative for abdominal pain, blood in stool, constipation, diarrhea, nausea and vomiting.  Endocrine: Negative for polydipsia, polyphagia and polyuria.  Musculoskeletal: Positive for back pain and neck pain. Negative for arthralgias, gait problem and myalgias.  Skin: Negative for rash.  Allergic/Immunologic: Negative for immunocompromised state.  Hematological: Negative for adenopathy. Does not bruise/bleed easily.  Psychiatric/Behavioral: Negative for dysphoric mood, sleep disturbance and suicidal ideas. The patient is not nervous/anxious.     Objective:  BP 121/78   Pulse (!) 113   Temp 98.1 F (36.7 C) (Oral)   Resp 16   Wt 229 lb 12.8 oz (104.2 kg)   SpO2 96%   BMI 30.32 kg/m   BP/Weight 12/03/2016 09/14/2016 [id number]  Systolic BP 123XX123 Q000111Q 123456  Diastolic BP 78 83 89  Wt. (Lbs) 229.8 230.6 227.4  BMI 30.32 30.42 30    Physical Exam  Constitutional: He appears well-developed and well-nourished. No distress.  HENT:  Head: Normocephalic and atraumatic.  Neck: Normal range of motion. Neck supple. Muscular tenderness present. No spinous process tenderness present. No neck rigidity. No edema, no erythema and normal range of motion present.  Cardiovascular: Normal rate, regular rhythm, normal heart sounds and intact distal pulses.   Pulmonary/Chest: Effort  normal and breath sounds normal.  Musculoskeletal: He exhibits no edema.       Cervical back: He exhibits tenderness.       Thoracic back: He exhibits tenderness.  Neurological: He is alert.  Skin: Skin is warm and dry. No rash noted. No erythema.  Psychiatric: He has a normal mood and affect.    Lab Results  Component Value Date   HGBA1C 6.2 09/14/2016   Lab Results    Component Value Date   HGBA1C 6.2 09/14/2016    CBG 123  Assessment & Plan:   Noel was seen today for follow-up and hypertension.  Diagnoses and all orders for this visit:  Controlled type 2 diabetes mellitus with complication, with long-term current use of insulin (HCC) -     POCT glucose (manual entry) -     metFORMIN (GLUCOPHAGE XR) 500 MG 24 hr tablet; Take 1 tablet (500 mg total) by mouth daily with breakfast.  Chronic midline posterior neck pain -     ranitidine (ZANTAC 75) 75 MG tablet; Take 1 tablet (75 mg total) by mouth 2 (two) times daily. -     celecoxib (CELEBREX) 200 MG capsule; Take 1 capsule (200 mg total) by mouth 2 (two) times daily.  Chronic midline thoracic back pain -     ranitidine (ZANTAC 75) 75 MG tablet; Take 1 tablet (75 mg total) by mouth 2 (two) times daily. -     celecoxib (CELEBREX) 200 MG capsule; Take 1 capsule (200 mg total) by mouth 2 (two) times daily.   No orders of the defined types were placed in this encounter.   Follow-up: Return in about 6 weeks (around 01/14/2017) for chronic neck and back pain .   Boykin Nearing MD

## 2016-12-03 NOTE — Patient Instructions (Addendum)
Kyle Campos was seen today for follow-up and hypertension.  Diagnoses and all orders for this visit:  Controlled type 2 diabetes mellitus with complication, with long-term current use of insulin (HCC) -     POCT glucose (manual entry) -     metFORMIN (GLUCOPHAGE XR) 500 MG 24 hr tablet; Take 1 tablet (500 mg total) by mouth daily with breakfast.  Chronic midline posterior neck pain -     ranitidine (ZANTAC 75) 75 MG tablet; Take 1 tablet (75 mg total) by mouth 2 (two) times daily. -     celecoxib (CELEBREX) 200 MG capsule; Take 1 capsule (200 mg total) by mouth 2 (two) times daily.  Chronic midline thoracic back pain -     ranitidine (ZANTAC 75) 75 MG tablet; Take 1 tablet (75 mg total) by mouth 2 (two) times daily. -     celecoxib (CELEBREX) 200 MG capsule; Take 1 capsule (200 mg total) by mouth 2 (two) times daily.   F/u in 6 weeks for blood sugar and neck and back pain   Dr. Adrian Blackwater

## 2016-12-06 MED FILL — raNITIdine HCL 150 MG TABS: 150 | 30 days supply | Qty: 30 | Fill #0

## 2016-12-06 MED FILL — METFORMIN HCL ER 500 MG TAB: 500 | 30 days supply | Qty: 30 | Fill #0

## 2016-12-06 MED FILL — ?CELECOXIB 200 MG CAPSULE: 200 | 30 days supply | Qty: 60 | Fill #0

## 2016-12-06 NOTE — Assessment & Plan Note (Signed)
Chronic  Start celebrex

## 2016-12-06 NOTE — Assessment & Plan Note (Signed)
Controlled  Continue lisinopril 10 mg daily  

## 2016-12-06 NOTE — Assessment & Plan Note (Signed)
Declined with rise in A1c and weight gain Restart metformin

## 2016-12-28 MED FILL — ?LISINOPRIL 10 MG TABLET: 10 | 30 days supply | Qty: 30 | Fill #2

## 2017-01-03 MED FILL — METFORMIN HCL ER 500 MG TAB: 500 | 30 days supply | Qty: 30 | Fill #1

## 2017-01-14 ENCOUNTER — Ambulatory Visit: Payer: Self-pay | Admitting: Family Medicine

## 2017-01-18 MED FILL — raNITIdine HCL 150 MG TABS: 150 | 30 days supply | Qty: 30 | Fill #1

## 2017-01-18 MED FILL — ?CELECOXIB 200 MG CAPSULE: 200 | 30 days supply | Qty: 60 | Fill #1

## 2017-01-24 ENCOUNTER — Other Ambulatory Visit: Payer: Self-pay | Admitting: Family Medicine

## 2017-01-24 DIAGNOSIS — Z0000 Encounter for general adult medical examination without abnormal findings: Secondary | ICD-10-CM

## 2017-01-24 DIAGNOSIS — M546 Pain in thoracic spine: Secondary | ICD-10-CM

## 2017-01-24 DIAGNOSIS — I10 Essential (primary) hypertension: Secondary | ICD-10-CM

## 2017-01-24 DIAGNOSIS — M542 Cervicalgia: Principal | ICD-10-CM

## 2017-01-24 DIAGNOSIS — E118 Type 2 diabetes mellitus with unspecified complications: Secondary | ICD-10-CM

## 2017-01-24 DIAGNOSIS — G8929 Other chronic pain: Secondary | ICD-10-CM

## 2017-01-24 DIAGNOSIS — Z794 Long term (current) use of insulin: Secondary | ICD-10-CM

## 2017-01-24 MED ORDER — RANITIDINE HCL 75 MG PO TABS: 75.0000 mg | ORAL_TABLET | Freq: Two times a day (BID) | ORAL | 0 refills | Status: DC

## 2017-01-24 MED ORDER — METFORMIN HCL ER 500 MG PO TB24: 500.0000 mg | ORAL_TABLET | Freq: Every day | ORAL | 0 refills | Status: DC

## 2017-01-24 MED ORDER — CELECOXIB 200 MG PO CAPS: 200.0000 mg | ORAL_CAPSULE | Freq: Two times a day (BID) | ORAL | 0 refills | Status: DC

## 2017-01-24 MED ORDER — LISINOPRIL 10 MG PO TABS: 10.0000 mg | ORAL_TABLET | Freq: Every day | ORAL | 0 refills | Status: DC

## 2017-01-24 MED FILL — ?LISINOPRIL 10 MG TABLET: 10 | 30 days supply | Qty: 30 | Fill #0

## 2017-01-24 NOTE — Telephone Encounter (Signed)
Medications refilled x 30 days 

## 2017-01-24 NOTE — Telephone Encounter (Signed)
Pt calling to request a refill on his htn meds until his appt on May 10th. Please f/u. Thank you.

## 2017-01-25 ENCOUNTER — Other Ambulatory Visit: Payer: Self-pay

## 2017-01-25 MED ORDER — RANITIDINE HCL 150 MG PO TABS: 150.0000 mg | ORAL_TABLET | Freq: Every day | ORAL | 3 refills | Status: DC

## 2017-02-10 ENCOUNTER — Ambulatory Visit: Payer: Self-pay | Attending: Family Medicine | Admitting: Family Medicine

## 2017-02-10 ENCOUNTER — Encounter: Payer: Self-pay | Admitting: Family Medicine

## 2017-02-10 VITALS — BP 125/84 | HR 79 | Temp 98.0°F | Ht 73.0 in | Wt 225.4 lb

## 2017-02-10 DIAGNOSIS — F17200 Nicotine dependence, unspecified, uncomplicated: Secondary | ICD-10-CM

## 2017-02-10 DIAGNOSIS — Z23 Encounter for immunization: Secondary | ICD-10-CM

## 2017-02-10 DIAGNOSIS — I10 Essential (primary) hypertension: Secondary | ICD-10-CM | POA: Insufficient documentation

## 2017-02-10 DIAGNOSIS — Z79899 Other long term (current) drug therapy: Secondary | ICD-10-CM | POA: Insufficient documentation

## 2017-02-10 DIAGNOSIS — G8929 Other chronic pain: Secondary | ICD-10-CM | POA: Insufficient documentation

## 2017-02-10 DIAGNOSIS — M546 Pain in thoracic spine: Secondary | ICD-10-CM | POA: Insufficient documentation

## 2017-02-10 DIAGNOSIS — Z0000 Encounter for general adult medical examination without abnormal findings: Secondary | ICD-10-CM

## 2017-02-10 DIAGNOSIS — E118 Type 2 diabetes mellitus with unspecified complications: Secondary | ICD-10-CM | POA: Insufficient documentation

## 2017-02-10 DIAGNOSIS — F17210 Nicotine dependence, cigarettes, uncomplicated: Secondary | ICD-10-CM | POA: Insufficient documentation

## 2017-02-10 DIAGNOSIS — M542 Cervicalgia: Secondary | ICD-10-CM | POA: Insufficient documentation

## 2017-02-10 DIAGNOSIS — Z794 Long term (current) use of insulin: Secondary | ICD-10-CM | POA: Insufficient documentation

## 2017-02-10 LAB — POCT GLYCOSYLATED HEMOGLOBIN (HGB A1C): HEMOGLOBIN A1C: 6.9

## 2017-02-10 LAB — GLUCOSE, POCT (MANUAL RESULT ENTRY): POC GLUCOSE: 180 mg/dL — AB (ref 70–99)

## 2017-02-10 MED ORDER — CELECOXIB 200 MG PO CAPS: 200.0000 mg | ORAL_CAPSULE | Freq: Two times a day (BID) | ORAL | 1 refills | Status: DC

## 2017-02-10 MED ORDER — RANITIDINE HCL 75 MG PO TABS: 75.0000 mg | ORAL_TABLET | Freq: Two times a day (BID) | ORAL | 2 refills | Status: DC

## 2017-02-10 MED ORDER — METFORMIN HCL ER 500 MG PO TB24: 500.0000 mg | ORAL_TABLET | Freq: Every day | ORAL | 11 refills | Status: DC

## 2017-02-10 MED ORDER — LISINOPRIL 10 MG PO TABS: 10.0000 mg | ORAL_TABLET | Freq: Every day | ORAL | 11 refills | Status: DC

## 2017-02-10 NOTE — Addendum Note (Signed)
Addended by: Boykin Nearing on: 02/10/2017 08:57 AM   Modules accepted: Orders

## 2017-02-10 NOTE — Assessment & Plan Note (Signed)
A: BP at goal Med: compliant P: Continue current regimen  

## 2017-02-10 NOTE — Patient Instructions (Addendum)
Kyle Campos was seen today for diabetes.  Diagnoses and all orders for this visit:  Controlled type 2 diabetes mellitus with complication, with long-term current use of insulin (HCC) -     POCT glucose (manual entry) -     POCT glycosylated hemoglobin (Hb A1C) -     metFORMIN (GLUCOPHAGE XR) 500 MG 24 hr tablet; Take 1 tablet (500 mg total) by mouth daily with breakfast. -     Lipid panel; Future -     CMP14+EGFR  Essential hypertension -     lisinopril (PRINIVIL,ZESTRIL) 10 MG tablet; Take 1 tablet (10 mg total) by mouth daily. -     CMP14+EGFR  Healthcare maintenance -     lisinopril (PRINIVIL,ZESTRIL) 10 MG tablet; Take 1 tablet (10 mg total) by mouth daily.  Chronic midline posterior neck pain -     celecoxib (CELEBREX) 200 MG capsule; Take 1 capsule (200 mg total) by mouth 2 (two) times daily. -     ranitidine (ZANTAC 75) 75 MG tablet; Take 1 tablet (75 mg total) by mouth 2 (two) times daily.  Chronic midline thoracic back pain -     celecoxib (CELEBREX) 200 MG capsule; Take 1 capsule (200 mg total) by mouth 2 (two) times daily. -     ranitidine (ZANTAC 75) 75 MG tablet; Take 1 tablet (75 mg total) by mouth 2 (two) times daily.  Smoking  Smoking cessation support: smoking cessation hotline: 1-800-QUIT-NOW.  Smoking cessation classes are available through [facility] and [facility]. Call [phone] or visit our website at [email or web address]  Start with 14 mg patch then step down to 7 mg patch   f/u in 3 months for HTN

## 2017-02-10 NOTE — Assessment & Plan Note (Signed)
A: diabetes controlled with slight increase in A1c, there are  P: Increase metformin to 1000 mg daily Checking CMP and lipids

## 2017-02-10 NOTE — Addendum Note (Signed)
Addended by: Gomez Cleverly on: 02/10/2017 09:05 AM   Modules accepted: Orders

## 2017-02-10 NOTE — Assessment & Plan Note (Signed)
Current smoker Cessation addressed Patient counseled to start patches

## 2017-02-10 NOTE — Progress Notes (Signed)
Subjective:  Patient ID: Kyle Campos, male    DOB: [birth year]  Age: 61 y.o. MRN: [id number]  CC: Diabetes   HPI Kyle Campos has diabetes, HTN, chronic pain in neck and back, he is a smoker he presents for    1. CHRONIC DIABETES  Disease Monitoring  Blood Sugar Ranges: not checking   Polyuria: no   Visual problems: no   Medication Compliance: compliant with metformin Medication Side Effects  Hypoglycemia: no   2. HTN:. No HA. Has neck pain. No CP, SOB or leg swellling. Compliant with lisinopril.   3. Chronic pain: improved on current therapy of celebrex. Also taking joint supplements and zantac current pain level is  3/10.   DG cervical spine 09/14/2016: IMPRESSION: No acute fracture or subluxation. 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 No prevertebral soft tissue swelling.  4. Current smoker: cutting down. Down to 7 cigs per day. Desire to quit. Denies cough.   Social History  Substance Use Topics  . Smoking status: Current Every Day Smoker    Packs/day: 1.00    Years: 40.00  . Smokeless tobacco: Never Used  . Alcohol use 0.0 oz/week     Comment: once monthly    Outpatient Medications Prior to Visit  Medication Sig Dispense Refill  . celecoxib (CELEBREX) 200 MG capsule Take 1 capsule (200 mg total) by mouth 2 (two) times daily. 60 capsule 0  . Glucosamine-Chondroitin 750-600 MG TABS Take 2 tablets by mouth daily. (Patient not taking: Reported on 12/03/2016)  0  . lisinopril (PRINIVIL,ZESTRIL) 10 MG tablet Take 1 tablet (10 mg total) by mouth daily. 30 tablet 0  . metFORMIN (GLUCOPHAGE XR) 500 MG 24 hr tablet Take 1 tablet (500 mg total) by mouth daily with breakfast. 30 tablet 0  . ranitidine (ZANTAC 75) 75 MG tablet Take 1 tablet (75 mg total) by mouth 2 (two) times daily. 60 tablet 0  . ranitidine (ZANTAC) 150 MG tablet Take 1 tablet (150 mg total) by mouth daily. 30 tablet 3  . Turmeric 500 MG CAPS Take 500 mg by mouth daily. (Patient not taking:  Reported on 12/03/2016)     No facility-administered medications prior to visit.     ROS Review of Systems  Constitutional: Negative for chills, fatigue, fever and unexpected weight change.  Eyes: Negative for visual disturbance.  Respiratory: Negative for cough and shortness of breath.   Cardiovascular: Negative for chest pain, palpitations and leg swelling.  Gastrointestinal: Negative for abdominal pain, blood in stool, constipation, diarrhea, nausea and vomiting.  Endocrine: Negative for polydipsia, polyphagia and polyuria.  Musculoskeletal: Positive for back pain and neck pain. Negative for arthralgias, gait problem and myalgias.  Skin: Negative for rash.  Allergic/Immunologic: Negative for immunocompromised state.  Hematological: Negative for adenopathy. Does not bruise/bleed easily.  Psychiatric/Behavioral: Negative for dysphoric mood, sleep disturbance and suicidal ideas. The patient is not nervous/anxious.     Objective:  BP 125/84   Pulse 79   Temp 98 F (36.7 C) (Oral)   Ht '6\' 1"'$  (1.854 m)   Wt 225 lb 6.4 oz (102.2 kg)   SpO2 94%   BMI 29.74 kg/m   BP/Weight 02/10/2017 12/03/2016 93/26/7124  Systolic BP 580 998 338  Diastolic BP 84 78 83  Wt. (Lbs) 225.4 229.8 230.6  BMI 29.74 30.32 30.42    Physical Exam  Constitutional: He appears well-developed and well-nourished. No distress.  HENT:  Head: Normocephalic and atraumatic.  Eyes:    Cardiovascular: Normal rate, regular  rhythm, normal heart sounds and intact distal pulses.   Pulses:      Dorsalis pedis pulses are 2+ on the right side, and 2+ on the left side.       Posterior tibial pulses are 2+ on the right side, and 2+ on the left side.  Pulmonary/Chest: Effort normal and breath sounds normal.  Musculoskeletal: He exhibits no edema.       Feet:  Neurological: He is alert.  Skin: Skin is warm and dry. No rash noted. No erythema.  Psychiatric: He has a normal mood and affect.    Lab Results  Component  Value Date   HGBA1C 6.2 09/14/2016   CBG 180 Assessment & Plan:   Kyle Campos was seen today for diabetes.  Diagnoses and all orders for this visit:  Controlled type 2 diabetes mellitus with complication, with long-term current use of insulin (HCC) -     POCT glucose (manual entry) -     POCT glycosylated hemoglobin (Hb A1C) -     metFORMIN (GLUCOPHAGE XR) 500 MG 24 hr tablet; Take 1 tablet (500 mg total) by mouth daily with breakfast. -     Lipid panel; Future -     CMP14+EGFR  Essential hypertension -     lisinopril (PRINIVIL,ZESTRIL) 10 MG tablet; Take 1 tablet (10 mg total) by mouth daily. -     CMP14+EGFR  Healthcare maintenance -     lisinopril (PRINIVIL,ZESTRIL) 10 MG tablet; Take 1 tablet (10 mg total) by mouth daily.  Chronic midline posterior neck pain -     celecoxib (CELEBREX) 200 MG capsule; Take 1 capsule (200 mg total) by mouth 2 (two) times daily. -     ranitidine (ZANTAC 75) 75 MG tablet; Take 1 tablet (75 mg total) by mouth 2 (two) times daily.  Chronic midline thoracic back pain -     celecoxib (CELEBREX) 200 MG capsule; Take 1 capsule (200 mg total) by mouth 2 (two) times daily. -     ranitidine (ZANTAC 75) 75 MG tablet; Take 1 tablet (75 mg total) by mouth 2 (two) times daily.  Smoking   No orders of the defined types were placed in this encounter.   Follow-up: No Follow-up on file.   Boykin Nearing MD

## 2017-02-11 LAB — CMP14+EGFR
A/G RATIO: 1.8 (ref 1.2–2.2)
ALT: 24 IU/L (ref 0–44)
AST: 18 IU/L (ref 0–40)
Albumin: 4.8 g/dL (ref 3.5–5.5)
Alkaline Phosphatase: 54 IU/L (ref 39–117)
BUN/Creatinine Ratio: 20 (ref 9–20)
BUN: 19 mg/dL (ref 6–24)
Bilirubin Total: 0.3 mg/dL (ref 0.0–1.2)
CALCIUM: 9.9 mg/dL (ref 8.7–10.2)
CO2: 21 mmol/L (ref 18–29)
Chloride: 102 mmol/L (ref 96–106)
Creatinine, Ser: 0.95 mg/dL (ref 0.76–1.27)
GFR, EST AFRICAN AMERICAN: 102 mL/min/{1.73_m2} (ref >59)
GFR, EST NON AFRICAN AMERICAN: 88 mL/min/{1.73_m2} (ref >59)
GLOBULIN, TOTAL: 2.7 g/dL (ref 1.5–4.5)
Glucose: 150 mg/dL — ABNORMAL HIGH (ref 65–99)
POTASSIUM: 4.9 mmol/L (ref 3.5–5.2)
Sodium: 146 mmol/L — ABNORMAL HIGH (ref 134–144)
TOTAL PROTEIN: 7.5 g/dL (ref 6.0–8.5)

## 2017-02-16 ENCOUNTER — Encounter: Payer: Self-pay | Admitting: Family Medicine

## 2017-02-24 ENCOUNTER — Encounter (HOSPITAL_BASED_OUTPATIENT_CLINIC_OR_DEPARTMENT_OTHER): Payer: Self-pay | Admitting: Emergency Medicine

## 2017-02-24 ENCOUNTER — Emergency Department (HOSPITAL_BASED_OUTPATIENT_CLINIC_OR_DEPARTMENT_OTHER)
Admission: EM | Admit: 2017-02-24 | Discharge: 2017-02-24 | Disposition: A | Discharge status: Home / Self Care | Payer: Self-pay | Attending: Emergency Medicine | Admitting: Emergency Medicine

## 2017-02-24 DIAGNOSIS — H60391 Other infective otitis externa, right ear: Secondary | ICD-10-CM | POA: Insufficient documentation

## 2017-02-24 DIAGNOSIS — F17210 Nicotine dependence, cigarettes, uncomplicated: Secondary | ICD-10-CM | POA: Insufficient documentation

## 2017-02-24 DIAGNOSIS — Z79899 Other long term (current) drug therapy: Secondary | ICD-10-CM | POA: Insufficient documentation

## 2017-02-24 DIAGNOSIS — Z7984 Long term (current) use of oral hypoglycemic drugs: Secondary | ICD-10-CM | POA: Insufficient documentation

## 2017-02-24 DIAGNOSIS — H9201 Otalgia, right ear: Secondary | ICD-10-CM

## 2017-02-24 DIAGNOSIS — E119 Type 2 diabetes mellitus without complications: Secondary | ICD-10-CM | POA: Insufficient documentation

## 2017-02-24 DIAGNOSIS — I10 Essential (primary) hypertension: Secondary | ICD-10-CM | POA: Insufficient documentation

## 2017-02-24 MED ORDER — AMOXICILLIN-POT CLAVULANATE 875-125 MG PO TABS: 1.0000 | ORAL_TABLET | Freq: Two times a day (BID) | ORAL | 0 refills | Status: AC

## 2017-02-24 MED ORDER — DOCUSATE SODIUM 50 MG/5ML PO LIQD
ORAL | Status: AC
  Filled 2017-02-24: qty 10

## 2017-02-24 MED ORDER — CIPROFLOXACIN-DEXAMETHASONE 0.3-0.1 % OT SUSP: 4.0000 [drp] | Freq: Two times a day (BID) | OTIC | 0 refills | Status: AC

## 2017-02-24 NOTE — ED Triage Notes (Signed)
Pt reports right ear pain starting yesterday.  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  Pt tried flushing out ear at home with no success.  Pt also s/o right side jaw pain.

## 2017-02-24 NOTE — ED Notes (Signed)
ED Provider at bedside. 

## 2017-02-24 NOTE — ED Provider Notes (Signed)
[facility] Provider Note   CSN: [id number] Arrival date & time: 02/24/17  1726  By signing my name below, I, Kyle Campos, attest that this documentation has been prepared under the direction and in the presence of Kyle Echevaria, PA-C Electronically Signed: Collene Campos, Scribe. 02/24/17. 5:56 PM.  History   Chief Complaint Chief Complaint  Patient presents with  . Otalgia   HPI Comments: Kyle Campos is a 61 y.o. male with a history of hypertension and diabetes mellitus, who presents to the Emergency Department complaining of sudden-onset, constant right ear pain that began yesterday. 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 Patient states his symptoms are similar to this incident. Patient reports associated pain radiation to the right jaw. Patient reports attempting to flush the ear, but he had no success. No medications taken prior to arrival. Patient denies any fever, chills, nausea, vomiting, or any additional symptoms.   The history is provided by the patient. No language interpreter was used.    Past Medical History:  Diagnosis Date  . Colon polyps 03/14/2015   Tubular adenoma x 6  . Diabetes mellitus ([place])   . Diverticulosis   . Hepatic steatosis   . Lung nodules     Patient Active Problem List   Diagnosis Date Noted  . Chronic midline thoracic back pain 09/15/2016  . Essential hypertension 09/15/2016  . Chronic midline posterior neck pain 06/27/2016  . Diabetes type 2, controlled ([place]) 06/19/2015  . Tinea pedis 06/19/2015  . Smoking 01/02/2015    Past Surgical History:  Procedure Laterality Date  . APPENDECTOMY         Home Medications    Prior to Admission medications   Medication Sig Start Date End Date Taking? Authorizing Provider  celecoxib (CELEBREX) 200 MG capsule Take 1 capsule (200 mg total) by mouth 2 (two) times daily. 02/10/17  Yes Funches, Josalyn, MD  Glucosamine-Chondroitin 750-600  MG TABS Take 2 tablets by mouth daily. 09/15/16  Yes Funches, Josalyn, MD  lisinopril (PRINIVIL,ZESTRIL) 10 MG tablet Take 1 tablet (10 mg total) by mouth daily. 02/10/17  Yes Funches, Adriana Mccallum, MD  metFORMIN (GLUCOPHAGE XR) 500 MG 24 hr tablet Take 1 tablet (500 mg total) by mouth daily with breakfast. 02/10/17  Yes Funches, Josalyn, MD  amoxicillin-clavulanate (AUGMENTIN) 875-125 MG tablet Take 1 tablet by mouth 2 (two) times daily. 02/24/17 03/03/17  Kyle Campos B, PA-C  ciprofloxacin-dexamethasone (CIPRODEX) otic suspension Place 4 drops into the right ear 2 (two) times daily. 02/24/17 03/03/17  Emeline General, PA-C  ranitidine (ZANTAC 75) 75 MG tablet Take 1 tablet (75 mg total) by mouth 2 (two) times daily. 02/10/17   Funches, Adriana Mccallum, MD  Turmeric 500 MG CAPS Take 500 mg by mouth daily. Patient not taking: Reported on 12/03/2016 09/15/16   Boykin Nearing, MD    Family History Family History  Problem Relation Age of Onset  . Diabetes Mother   . Cancer Father        lung cancer   . Stroke Maternal Grandfather   . Colon cancer Neg Hx     Social History Social History  Substance Use Topics  . Smoking status: Current Every Day Smoker    Packs/day: 0.50    Years: 40.00    Types: Cigarettes  . Smokeless tobacco: Never Used  . Alcohol use 0.0 oz/week     Comment: once monthly     Allergies   Patient has  no known allergies.   Review of Systems Review of Systems  Constitutional: Negative for chills and fever.  HENT: Positive for dental problem and ear pain (right). Negative for drooling, sore throat, trouble swallowing and voice change.        Right lower gingiva pain  Respiratory: Negative for shortness of breath and wheezing.   Cardiovascular: Negative for chest pain and palpitations.  Gastrointestinal: Negative for diarrhea, nausea and vomiting.  Musculoskeletal: Negative for myalgias, neck pain and neck stiffness.  Skin: Negative for color change, pallor and rash.      Physical Exam Updated Vital Signs BP 140/90 (BP Location: Left Arm)   Pulse 83   Temp 98.3 F (36.8 C) (Oral)   Resp 20   Ht 6\' 1"  (1.854 m)   Wt 99.8 kg (220 lb)   SpO2 100%   BMI 29.03 kg/m   Physical Exam  Constitutional: He is oriented to person, place, and time. He appears well-developed and well-nourished. No distress.  Patient is afebrile, non-toxic appearing, seated comfortably in chair in no acute distress.   HENT:  Head: Normocephalic and atraumatic.  Left Ear: External ear normal.  Mouth/Throat: Oropharynx is clear and moist. No oropharyngeal exudate.    White material on the external ear canal, consistent with mild otitis externa. Uvula is midline, arches are symmetrical and intact, no exudate, sublingual mucosa is soft and nontender, no obvious evidence of oral abscess, no concern for Ludwig's angina. Tolerating oral secretions well.  Eyes: Conjunctivae and EOM are normal. Right eye exhibits no discharge. Left eye exhibits no discharge.  Neck: Normal range of motion. Neck supple.  Cardiovascular: Normal rate, regular rhythm, normal heart sounds and intact distal pulses.   No murmur heard. Pulmonary/Chest: Effort normal and breath sounds normal. No respiratory distress. He has no wheezes.  Abdominal: Soft. He exhibits no distension. There is no tenderness.  Musculoskeletal: Normal range of motion.  Lymphadenopathy:    He has no cervical adenopathy.  Neurological: He is alert and oriented to person, place, and time.  Skin: Skin is warm. No rash noted. He is not diaphoretic. No erythema. No pallor.  Psychiatric: He has a normal mood and affect. Judgment normal.  Nursing note and vitals reviewed.    ED Treatments / Results  DIAGNOSTIC STUDIES: Oxygen Saturation is 100% on RA, normal by my interpretation.    COORDINATION OF CARE: 5:55 PM Discussed treatment plan with pt at bedside and pt agreed to plan, which includes right ear flushing.   Labs (all  labs ordered are listed, but only abnormal results are displayed) Labs Reviewed - No data to display  EKG  EKG Interpretation None       Radiology No results found.  Procedures Procedures (including critical care time)  Medications Ordered in ED Medications  docusate (COLACE) 50 MG/5ML liquid (not administered)     Initial Impression / Assessment and Plan / ED Course  I have reviewed the triage vital signs and the nursing notes.  Pertinent labs & imaging results that were available during my care of the patient were reviewed by me and considered in my medical decision making (see chart for details).     Patient presents with right otitis externa nonmalignant. He also has significant tooth decay and has not been seeing a dentist. He does report some discomfort in the right lower teeth. Second to last lower right molar chipped. Patient is otherwise well-appearing, exam is reassuring, no concern for Ludwig angina, tolerating oral secretions, afebrile and  nontoxic appearing.  We'll discharge home with close follow-up with dentist in the morning and Augmentin as well as optic drops for otitis externa. Patient was urged to follow-up with dentist and provided referral.  Discussed strict return precautions and advised to return to the emergency department if experiencing any new or worsening symptoms. Instructions were understood and patient agreed with discharge plan. Final Clinical Impressions(s) / ED Diagnoses   Final diagnoses:  Otalgia of right ear  Other infective acute otitis externa of right ear    New Prescriptions Discharge Medication List as of 02/24/2017  6:39 PM    START taking these medications   Details  amoxicillin-clavulanate (AUGMENTIN) 875-125 MG tablet Take 1 tablet by mouth 2 (two) times daily., Starting Thu 02/24/2017, Until Thu 03/03/2017, Print    ciprofloxacin-dexamethasone (CIPRODEX) otic suspension Place 4 drops into the right ear 2 (two) times daily.,  Starting Thu 02/24/2017, Until Thu 03/03/2017, Print       I personally performed the services described in this documentation, which was scribed in my presence. The recorded information has been reviewed and is accurate.    Dossie Der 02/24/17 Sanjuan Dame, MD 02/24/17 2252

## 2017-02-24 NOTE — Discharge Instructions (Signed)
As discussed, please follow-up with the dentist provided. Take antibiotics as prescribed even if he felt better and complete the entire course. Place 4 drops of antibiotics in the right ear twice a day for 7 days. Follow up with her primary care provider.  Return to the ER if you experience any worsening of symptoms or new concerning symptoms in the meantime.

## 2017-02-25 MED FILL — CIPRODEX OTIC SUSPENSION: 0.3-0.1 | 7 days supply | Qty: 8 | Fill #0

## 2017-02-25 MED FILL — AMOX-CLAV 875-125 MG TABLET: 875-125 | 7 days supply | Qty: 14 | Fill #0

## 2017-03-01 ENCOUNTER — Other Ambulatory Visit: Payer: Self-pay | Admitting: Family Medicine

## 2017-03-01 DIAGNOSIS — G8929 Other chronic pain: Secondary | ICD-10-CM

## 2017-03-01 DIAGNOSIS — M546 Pain in thoracic spine: Secondary | ICD-10-CM

## 2017-03-01 DIAGNOSIS — M542 Cervicalgia: Principal | ICD-10-CM

## 2017-03-01 MED FILL — ?CELECOXIB 200 MG CAPSULE: 200 MG | 30 days supply | Qty: 60 | Fill #2

## 2017-03-01 MED FILL — ?LISINOPRIL 10 MG TABLET: 10 | 30 days supply | Qty: 30 | Fill #0

## 2017-04-08 ENCOUNTER — Other Ambulatory Visit: Payer: Self-pay | Admitting: Family Medicine

## 2017-04-08 DIAGNOSIS — G8929 Other chronic pain: Secondary | ICD-10-CM

## 2017-04-08 DIAGNOSIS — M542 Cervicalgia: Principal | ICD-10-CM

## 2017-04-08 DIAGNOSIS — M546 Pain in thoracic spine: Secondary | ICD-10-CM

## 2017-04-08 MED FILL — raNITIdine HCL 150 MG TABS: 150 | 30 days supply | Qty: 30 | Fill #2

## 2017-04-08 MED FILL — ?LISINOPRIL 10 MG TABLET: 10 | 30 days supply | Qty: 30 | Fill #1

## 2017-05-10 ENCOUNTER — Ambulatory Visit: Payer: Self-pay | Admitting: Family Medicine

## 2017-05-12 ENCOUNTER — Ambulatory Visit: Payer: Self-pay | Admitting: Family Medicine

## 2017-05-16 MED FILL — LISINOPRIL 10 MG TABLET: 10 | 30 days supply | Qty: 30 | Fill #2

## 2017-05-17 ENCOUNTER — Encounter: Payer: Self-pay | Admitting: Family Medicine

## 2017-05-17 ENCOUNTER — Ambulatory Visit: Payer: Self-pay | Attending: Family Medicine | Admitting: Family Medicine

## 2017-05-17 VITALS — BP 117/76 | HR 79 | Temp 97.7°F | Resp 18 | Ht 72.0 in | Wt 224.4 lb

## 2017-05-17 DIAGNOSIS — E119 Type 2 diabetes mellitus without complications: Secondary | ICD-10-CM | POA: Insufficient documentation

## 2017-05-17 DIAGNOSIS — I10 Essential (primary) hypertension: Secondary | ICD-10-CM

## 2017-05-17 DIAGNOSIS — R0989 Other specified symptoms and signs involving the circulatory and respiratory systems: Secondary | ICD-10-CM

## 2017-05-17 DIAGNOSIS — E1121 Type 2 diabetes mellitus with diabetic nephropathy: Secondary | ICD-10-CM

## 2017-05-17 DIAGNOSIS — F17200 Nicotine dependence, unspecified, uncomplicated: Secondary | ICD-10-CM

## 2017-05-17 DIAGNOSIS — Z7984 Long term (current) use of oral hypoglycemic drugs: Secondary | ICD-10-CM | POA: Insufficient documentation

## 2017-05-17 DIAGNOSIS — F17210 Nicotine dependence, cigarettes, uncomplicated: Secondary | ICD-10-CM | POA: Insufficient documentation

## 2017-05-17 DIAGNOSIS — J209 Acute bronchitis, unspecified: Secondary | ICD-10-CM

## 2017-05-17 LAB — GLUCOSE, POCT (MANUAL RESULT ENTRY): POC GLUCOSE: 187 mg/dL — AB (ref 70–99)

## 2017-05-17 LAB — POCT GLYCOSYLATED HEMOGLOBIN (HGB A1C): HEMOGLOBIN A1C: 6.5

## 2017-05-17 MED ORDER — FLUTICASONE PROPIONATE 50 MCG/ACT NA SUSP: 2.0000 | Freq: Every day | NASAL | 6 refills | Status: DC

## 2017-05-17 MED ORDER — LISINOPRIL 10 MG PO TABS: 10.0000 mg | ORAL_TABLET | Freq: Every day | ORAL | 3 refills | Status: DC

## 2017-05-17 MED ORDER — IPRATROPIUM-ALBUTEROL 0.5-2.5 (3) MG/3ML IN SOLN: 3.0000 mL | RESPIRATORY_TRACT | Status: DC | PRN

## 2017-05-17 MED ORDER — GUAIFENESIN ER 600 MG PO TB12: 600.0000 mg | ORAL_TABLET | Freq: Two times a day (BID) | ORAL | 0 refills | Status: DC

## 2017-05-17 MED ORDER — GUAIFENESIN-DM 100-10 MG/5ML PO SYRP: 5.0000 mL | ORAL_SOLUTION | ORAL | 1 refills | Status: DC | PRN

## 2017-05-17 MED ORDER — CELECOXIB 200 MG PO CAPS: 200.0000 mg | ORAL_CAPSULE | Freq: Two times a day (BID) | ORAL | 1 refills | Status: DC

## 2017-05-17 MED ORDER — METFORMIN HCL ER 500 MG PO TB24: 500.0000 mg | ORAL_TABLET | Freq: Every day | ORAL | 3 refills | Status: DC

## 2017-05-17 MED FILL — METFORMIN HCL ER 500 MG TAB: 500 | 30 days supply | Qty: 30 | Fill #0

## 2017-05-17 MED FILL — CELECOXIB 200 MG CAPSULE: 200 | 30 days supply | Qty: 60 | Fill #0

## 2017-05-17 MED FILL — ROBAFEN-DM SYRUP: 100-10 | 11 days supply | Qty: 236 | Fill #0

## 2017-05-17 NOTE — Progress Notes (Signed)
Patient is here for HTN   Patient complains about Toothache & since Sunday been feeling weak with sinus congestation & coughing

## 2017-05-17 NOTE — Patient Instructions (Signed)

## 2017-05-17 NOTE — Progress Notes (Signed)
Subjective:  Patient ID: Kyle Campos, male    DOB: [birth year]  Age: 61 y.o. MRN: [id number]  CC: Hypertension and Diabetes   HPI Kyle Campos presents for complains of clear nasal discharge, productive cough, and sore throat. Symptoms began several days ago. Associated symptoms include fatigue. Patient denies chills, fever, wheezing, or sick contacts. Reports taking alka-seltzer cold for symptoms and mucinex for congestion. Patient denies a history of asthma. Patient reports he smoke cigarettes.History of  DM. Symptoms: none.  Patient denies foot ulcerations, hyperglycemia, nausea, paresthesia of the feet, polydipsia, polyuria, visual disturbances and vomitting.  Evaluation to date has been included: fasting blood sugar and hemoglobin A1C.  Home sugars: BGs are running  consistent with Hgb A1C. Treatment to date: metformin. History of HTN.  He is not exercising and is not adherent to low salt diet.  BP well controlledat home. Cardiac symptoms none. Patient denies chest pain, chest pressure/discomfort, claudication, lower extremity edema, near-syncope and syncope.  Cardiovascular risk factors: advanced age (older than 20 for men, 32 for women), diabetes mellitus, hypertension, male gender, sedentary lifestyle and smoking/ tobacco exposure. Use of agents associated with hypertension: none. History of target organ damage: none.   Outpatient Medications Prior to Visit  Medication Sig Dispense Refill  . celecoxib (CELEBREX) 200 MG capsule Take 1 capsule (200 mg total) by mouth 2 (two) times daily. 60 capsule 1  . Glucosamine-Chondroitin 750-600 MG TABS Take 2 tablets by mouth daily.  0  . lisinopril (PRINIVIL,ZESTRIL) 10 MG tablet Take 1 tablet (10 mg total) by mouth daily. 30 tablet 11  . metFORMIN (GLUCOPHAGE XR) 500 MG 24 hr tablet Take 1 tablet (500 mg total) by mouth daily with breakfast. 60 tablet 11  . ranitidine (ZANTAC 75) 75 MG tablet Take 1 tablet (75 mg total) by mouth 2 (two) times  daily. 60 tablet 2  . Turmeric 500 MG CAPS Take 500 mg by mouth daily. (Patient not taking: Reported on 12/03/2016)     No facility-administered medications prior to visit.     ROS Review of Systems  Constitutional: Positive for fatigue.  HENT: Positive for postnasal drip, rhinorrhea and sore throat.   Eyes: Negative.   Respiratory: Positive for cough.   Cardiovascular: Negative.   Gastrointestinal: Negative.   Endocrine: Negative.   Skin: Negative.   Neurological: Negative.   Psychiatric/Behavioral: Negative.     Objective:  BP 117/76   Pulse 79   Temp 97.7 F (36.5 C) (Oral)   Resp 18   Ht 6' (1.829 m)   Wt 224 lb 6.4 oz (101.8 kg)   SpO2 92%   BMI 30.43 kg/m   BP/Weight 05/17/2017 02/24/2017 5/46/5681  Systolic BP 275 170 017  Diastolic BP 76 90 84  Wt. (Lbs) 224.4 220 225.4  BMI 30.43 29.03 29.74   Physical Exam  Constitutional: He appears well-developed and well-nourished.  HENT:  Head: Normocephalic and atraumatic.  Right Ear: External ear normal.  Left Ear: External ear normal.  Nose: Mucosal edema and rhinorrhea (clear) present.  Mouth/Throat: Oropharynx is clear and moist.  Eyes: Pupils are equal, round, and reactive to light. Conjunctivae are normal.  Cardiovascular: Normal rate, regular rhythm, normal heart sounds and intact distal pulses.   Pulmonary/Chest: Effort normal. He has rhonchi.  Rhonchi in all lobes  Abdominal: Soft. Bowel sounds are normal.  Skin: Skin is warm and dry.  Psychiatric: He has a normal mood and affect.  Nursing note and vitals reviewed.  Assessment & Plan:  Problem List Items Addressed This Visit      Cardiovascular and Mediastinum   Essential hypertension (Chronic)   Relevant Medications   lisinopril (PRINIVIL,ZESTRIL) 10 MG tablet     Endocrine   Diabetes type 2, controlled (HCC) (Chronic)   Relevant Medications   metFORMIN (GLUCOPHAGE XR) 500 MG 24 hr tablet   lisinopril (PRINIVIL,ZESTRIL) 10 MG tablet   Other  Relevant Orders   Glucose (CBG) (Completed)   HgB A1c (Completed)   Ambulatory referral to Ophthalmology    Other Visit Diagnoses    Acute bronchitis, unspecified organism    -  Primary   Relevant Medications   guaiFENesin-dextromethorphan (ROBITUSSIN DM) 100-10 MG/5ML syrup   fluticasone (FLONASE) 50 MCG/ACT nasal spray   guaiFENesin (MUCINEX) 600 MG 12 hr tablet   Other Relevant Orders   Respiratory virus panel (Completed)   Rhonchi       Relevant Medications   ipratropium-albuterol (DUONEB) 0.5-2.5 (3) MG/3ML nebulizer solution 3 mL   guaiFENesin-dextromethorphan (ROBITUSSIN DM) 100-10 MG/5ML syrup   guaiFENesin (MUCINEX) 600 MG 12 hr tablet   Other Relevant Orders   DG Chest 1 View   Chest congestion       Neb txt given in office.    Relevant Medications   ipratropium-albuterol (DUONEB) 0.5-2.5 (3) MG/3ML nebulizer solution 3 mL   guaiFENesin-dextromethorphan (ROBITUSSIN DM) 100-10 MG/5ML syrup   guaiFENesin (MUCINEX) 600 MG 12 hr tablet   Other Relevant Orders   DG Chest 1 View   Current every day smoker       Relevant Orders   DG Chest 1 View      Meds ordered this encounter  Medications  . celecoxib (CELEBREX) 200 MG capsule    Sig: Take 1 capsule (200 mg total) by mouth 2 (two) times daily.    Dispense:  60 capsule    Refill:  1    Order Specific Question:   Supervising Provider    Answer:   Tresa Garter [id number]  . metFORMIN (GLUCOPHAGE XR) 500 MG 24 hr tablet    Sig: Take 1 tablet (500 mg total) by mouth daily with breakfast.    Dispense:  90 tablet    Refill:  3    Order Specific Question:   Supervising Provider    Answer:   Tresa Garter [id number]  . ipratropium-albuterol (DUONEB) 0.5-2.5 (3) MG/3ML nebulizer solution 3 mL  . guaiFENesin-dextromethorphan (ROBITUSSIN DM) 100-10 MG/5ML syrup    Sig: Take 5 mLs by mouth every 4 (four) hours as needed for cough.    Dispense:  236 mL    Refill:  1    Order Specific Question:   Supervising  Provider    Answer:   Tresa Garter [id number]  . fluticasone (FLONASE) 50 MCG/ACT nasal spray    Sig: Place 2 sprays into both nostrils daily.    Dispense:  16 g    Refill:  6    Order Specific Question:   Supervising Provider    Answer:   Tresa Garter [id number]  . lisinopril (PRINIVIL,ZESTRIL) 10 MG tablet    Sig: Take 1 tablet (10 mg total) by mouth daily.    Dispense:  90 tablet    Refill:  3    Order Specific Question:   Supervising Provider    Answer:   Tresa Garter [id number]  . guaiFENesin (MUCINEX) 600 MG 12 hr tablet    Sig: Take 1 tablet (600 mg total) by mouth 2 (two) times daily.  Dispense:  14 tablet    Refill:  0    Order Specific Question:   Supervising Provider    Answer:   Tresa Garter [id number]    Follow-up: Return in about 3 months (around 08/17/2017), or if symptoms worsen or fail to improve, for HLD/DM.   Alfonse Spruce FNP

## 2017-05-20 ENCOUNTER — Telehealth: Payer: Self-pay

## 2017-05-20 LAB — RESPIRATORY VIRUS PANEL
Adenovirus: NEGATIVE
Influenza A: NEGATIVE
Influenza B: NEGATIVE
Metapneumovirus: NEGATIVE
PARAINFLUENZA 1 A: NEGATIVE
PARAINFLUENZA 3 A: NEGATIVE
Parainfluenza 2: NEGATIVE
RESPIRATORY SYNCYTIAL VIRUS B: NEGATIVE
RHINOVIRUS: POSITIVE — AB
Respiratory Syncytial Virus A: NEGATIVE

## 2017-05-20 NOTE — Telephone Encounter (Signed)
CMA call regarding lab results   Patient verify DOB   Patient Was aware and understood

## 2017-05-20 NOTE — Telephone Encounter (Signed)
-----   Message from Alfonse Spruce, [place] sent at 05/20/2017  8:33 AM EDT ----- Respiratory virus panel was positive for rhinovirus. This virus is most commonly associated with the common cold.  Viral syndromes may take 1 to 2 weeks for full recovery. Continue to take your medications to help manage symptoms. Increase water intake to stay hydrated and to help thin mucous.  If you develop high fever 100.4 or greater , SOB, follow up as soon as possible.

## 2017-06-16 ENCOUNTER — Other Ambulatory Visit: Payer: Self-pay | Admitting: Family Medicine

## 2017-06-16 DIAGNOSIS — G8929 Other chronic pain: Secondary | ICD-10-CM

## 2017-06-16 DIAGNOSIS — M546 Pain in thoracic spine: Secondary | ICD-10-CM

## 2017-06-16 DIAGNOSIS — M542 Cervicalgia: Principal | ICD-10-CM

## 2017-06-16 MED FILL — LISINOPRIL 10 MG TABS: 10 | 30 days supply | Qty: 30 | Fill #3

## 2017-06-16 MED FILL — METFORMIN HCL ER 500 MG TAB: 500 | 30 days supply | Qty: 30 | Fill #1

## 2017-06-20 MED FILL — raNITIdine HCL 150 MG TABS: 150 | 30 days supply | Qty: 30 | Fill #0

## 2017-07-07 ENCOUNTER — Ambulatory Visit: Payer: Self-pay | Attending: Family Medicine | Admitting: Family Medicine

## 2017-07-07 ENCOUNTER — Encounter: Payer: Self-pay | Admitting: Family Medicine

## 2017-07-07 VITALS — BP 113/76 | HR 101 | Temp 98.0°F | Resp 18 | Ht 73.0 in | Wt 221.4 lb

## 2017-07-07 DIAGNOSIS — I10 Essential (primary) hypertension: Secondary | ICD-10-CM | POA: Insufficient documentation

## 2017-07-07 DIAGNOSIS — L989 Disorder of the skin and subcutaneous tissue, unspecified: Secondary | ICD-10-CM | POA: Insufficient documentation

## 2017-07-07 DIAGNOSIS — R5383 Other fatigue: Secondary | ICD-10-CM | POA: Insufficient documentation

## 2017-07-07 DIAGNOSIS — Z23 Encounter for immunization: Secondary | ICD-10-CM | POA: Insufficient documentation

## 2017-07-07 DIAGNOSIS — F17200 Nicotine dependence, unspecified, uncomplicated: Secondary | ICD-10-CM

## 2017-07-07 DIAGNOSIS — F17210 Nicotine dependence, cigarettes, uncomplicated: Secondary | ICD-10-CM | POA: Insufficient documentation

## 2017-07-07 DIAGNOSIS — L819 Disorder of pigmentation, unspecified: Secondary | ICD-10-CM

## 2017-07-07 DIAGNOSIS — R05 Cough: Secondary | ICD-10-CM | POA: Insufficient documentation

## 2017-07-07 DIAGNOSIS — E1121 Type 2 diabetes mellitus with diabetic nephropathy: Secondary | ICD-10-CM | POA: Insufficient documentation

## 2017-07-07 DIAGNOSIS — Z79899 Other long term (current) drug therapy: Secondary | ICD-10-CM | POA: Insufficient documentation

## 2017-07-07 DIAGNOSIS — Z7984 Long term (current) use of oral hypoglycemic drugs: Secondary | ICD-10-CM | POA: Insufficient documentation

## 2017-07-07 DIAGNOSIS — Z801 Family history of malignant neoplasm of trachea, bronchus and lung: Secondary | ICD-10-CM | POA: Insufficient documentation

## 2017-07-07 DIAGNOSIS — K047 Periapical abscess without sinus: Secondary | ICD-10-CM | POA: Insufficient documentation

## 2017-07-07 DIAGNOSIS — M549 Dorsalgia, unspecified: Secondary | ICD-10-CM | POA: Insufficient documentation

## 2017-07-07 LAB — GLUCOSE, POCT (MANUAL RESULT ENTRY): POC Glucose: 138 mg/dl — AB (ref 70–99)

## 2017-07-07 MED ORDER — PENICILLIN V POTASSIUM 500 MG PO TABS: 500.0000 mg | ORAL_TABLET | Freq: Four times a day (QID) | ORAL | 0 refills | Status: DC

## 2017-07-07 MED ORDER — TRUE METRIX METER W/DEVICE KIT: 1.0000 | PACK | Freq: Once | 0 refills | Status: AC

## 2017-07-07 MED ORDER — GLUCOSE BLOOD VI STRP: ORAL_STRIP | 12 refills | Status: DC

## 2017-07-07 MED ORDER — IBUPROFEN 600 MG PO TABS: 600.0000 mg | ORAL_TABLET | Freq: Three times a day (TID) | ORAL | 0 refills | Status: DC | PRN

## 2017-07-07 MED ORDER — NICOTINE 21 MG/24HR TD PT24: 21.0000 mg | MEDICATED_PATCH | Freq: Every day | TRANSDERMAL | 1 refills | Status: DC

## 2017-07-07 MED ORDER — TRUEPLUS LANCETS 28G MISC: 1.0000 | Freq: Once | 12 refills | Status: AC

## 2017-07-07 MED ORDER — METFORMIN HCL ER 500 MG PO TB24: 500.0000 mg | ORAL_TABLET | Freq: Two times a day (BID) | ORAL | 2 refills | Status: DC

## 2017-07-07 NOTE — Progress Notes (Deleted)
Back pain  Fatigue  TSH   Not checking CBG   1 year ago eye/ leg   Increased opthal Blurred vision screen   Flu vaccine   Chronic cough  Lung cancer  40 year  Rhonchi  Cough clear chronic greater 3 month  pataches for nicotene

## 2017-07-07 NOTE — Progress Notes (Signed)
Patient is here for f/up   Patient complains back pain

## 2017-07-15 NOTE — Progress Notes (Signed)
Subjective:  Patient ID: Kyle Campos, male    DOB: [birth year]  Age: 61 y.o. MRN: [id number]  CC: Diabetes   HPI Kyle Campos presents for DM, HTN follow up. He complains of back pain.  History of  DM. Symptoms: visual disturbances-blurry vision.  Patient denies foot ulcerations, hyperglycemia, nausea, paresthesia of the feet, polydipsia, polyuria and vomitting.  Evaluation to date has been included: fasting blood sugar and hemoglobin A1C.  Home sugars: patient is  not checking sugars. Treatment to date: metformin. History of HTN.  He is not exercising and is not adherent to low salt diet.  BP well controlled at home. Cardiac symptoms none. Patient denies chest pain, chest pressure/discomfort, claudication, lower extremity edema, near-syncope and syncope.  Cardiovascular risk factors: advanced age (older than 68 for men, 24 for women), diabetes mellitus, hypertension, male gender, sedentary lifestyle and smoking/ tobacco exposure 40 year smoking history. He reports having brother who died of lung cancer. He is interested quitting smoking. Use of agents associated with hypertension: none. History of target organ damage: none. He complaints of chronic cough. Onset greater than 3 months ago. Denies any fevers, chills, sick contacts, weight loss, or hemoptysis. He does reports fatigue. Cough is productive with clear sputum. Skin lesions to bilateral upper eyelids. Gray and maculopapular in appearance. 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 He denies wearing sunscreen. He denies any pruritis, tenderness, or change in distrubution of lesions.   Outpatient Medications Prior to Visit  Medication Sig Dispense Refill  . celecoxib (CELEBREX) 200 MG capsule Take 1 capsule (200 mg total) by mouth 2 (two) times daily. 60 capsule 1  . fluticasone (FLONASE) 50 MCG/ACT nasal spray Place 2 sprays into both nostrils daily. 16 g 6  . guaiFENesin (MUCINEX) 600 MG 12 hr tablet Take  1 tablet (600 mg total) by mouth 2 (two) times daily. 14 tablet 0  . guaiFENesin-dextromethorphan (ROBITUSSIN DM) 100-10 MG/5ML syrup Take 5 mLs by mouth every 4 (four) hours as needed for cough. 236 mL 1  . lisinopril (PRINIVIL,ZESTRIL) 10 MG tablet Take 1 tablet (10 mg total) by mouth daily. 90 tablet 3  . metFORMIN (GLUCOPHAGE XR) 500 MG 24 hr tablet Take 1 tablet (500 mg total) by mouth daily with breakfast. 90 tablet 3   Facility-Administered Medications Prior to Visit  Medication Dose Route Frequency Provider Last Rate Last Dose  . ipratropium-albuterol (DUONEB) 0.5-2.5 (3) MG/3ML nebulizer solution 3 mL  3 mL Nebulization Q20 Min PRN Hairston, Mandesia R, FNP        ROS Review of Systems  Constitutional: Positive for fatigue.  Eyes: Negative.   Respiratory: Positive for cough.   Cardiovascular: Negative.   Gastrointestinal: Negative.   Endocrine: Negative.   Skin: Negative.   Neurological: Negative.   Psychiatric/Behavioral: Negative.     Objective:  BP 113/76 (BP Location: Left Arm, Patient Position: Sitting, Cuff Size: Normal)   Pulse (!) 101   Temp 98 F (36.7 C) (Oral)   Resp 18   Ht _0  (1.854 m)   Wt 221 lb 6.4 oz (100.4 kg)   SpO2 96%   BMI 29.21 kg/m   BP/Weight 07/07/2017 05/17/2017 04/11/6282  Systolic BP 662 947 654  Diastolic BP 76 76 90  Wt. (Lbs) 221.4 224.4 220  BMI 29.21 30.43 29.03   Physical Exam  Constitutional: He appears well-developed and well-nourished.  HENT:  Head: Normocephalic and atraumatic.  Right Ear: External ear normal.  Left  Ear: External ear normal.  Mouth/Throat: Oropharynx is clear and moist.  Eyes: Pupils are equal, round, and reactive to light. Conjunctivae are normal.  Neck: No JVD present.  Cardiovascular: Normal rate, regular rhythm, normal heart sounds and intact distal pulses.   Pulmonary/Chest: Effort normal. He has rhonchi.  Rhonchi in all lobes  Abdominal: Soft. Bowel sounds are normal.  Skin: Skin is warm and  dry.  Psychiatric: He has a normal mood and affect.  Nursing note and vitals reviewed.  Assessment & Plan:   1. Controlled type 2 diabetes mellitus with diabetic nephropathy, without long-term current use of insulin (HCC)  - Glucose (CBG) - metFORMIN (GLUCOPHAGE XR) 500 MG 24 hr tablet; Take 1 tablet (500 mg total) by mouth 2 (two) times daily with a meal.  Dispense: 60 tablet; Refill: 2 - Ambulatory referral to Ophthalmology - Blood Glucose Monitoring Suppl (TRUE METRIX METER) w/Device KIT; 1 Device by Does not apply route once.  Dispense: 1 kit; Refill: 0 - glucose blood test strip; Use as instructed  Dispense: 100 each; Refill: 12 - TRUEPLUS LANCETS 28G MISC; 1 kit by Does not apply route once.  Dispense: 100 each; Refill: 12  2. Essential hypertension   3. Fatigue, unspecified type   4. Current smoker Given patients smoking history and family history of lung CA. Lung CA screening discussed, he declined. - DG Chest 2 View; Future - nicotine (NICODERM CQ - DOSED IN MG/24 HOURS) 21 mg/24hr patch; Place 1 patch (21 mg total) onto the skin daily.  Dispense: 28 patch; Refill: 1  5. Pigmented skin lesions  - Ambulatory referral to Dermatology  6. Needs flu shot  - Flu Vaccine QUAD 6+ mos PF IM (Fluarix Quad PF)  7. Dental abscess  - Ambulatory referral to Dentistry - penicillin v potassium (VEETID) 500 MG tablet; Take 1 tablet (500 mg total) by mouth 4 (four) times daily.  Dispense: 20 tablet; Refill: 0 - ibuprofen (ADVIL,MOTRIN) 600 MG tablet; Take 1 tablet (600 mg total) by mouth every 8 (eight) hours as needed.  Dispense: 30 tablet; Refill: 0   Meds ordered this encounter  Medications  . metFORMIN (GLUCOPHAGE XR) 500 MG 24 hr tablet    Sig: Take 1 tablet (500 mg total) by mouth 2 (two) times daily with a meal.    Dispense:  60 tablet    Refill:  2    Order Specific Question:   Supervising Provider    Answer:   Tresa Garter [id number]  . Blood Glucose Monitoring  Suppl (TRUE METRIX METER) w/Device KIT    Sig: 1 Device by Does not apply route once.    Dispense:  1 kit    Refill:  0    Order Specific Question:   Supervising Provider    Answer:   Tresa Garter [id number]  . glucose blood test strip    Sig: Use as instructed    Dispense:  100 each    Refill:  12    Order Specific Question:   Supervising Provider    Answer:   Tresa Garter [[id number]]  . TRUEPLUS LANCETS 28G MISC    Sig: 1 kit by Does not apply route once.    Dispense:  100 each    Refill:  12    Order Specific Question:   Supervising Provider    Answer:   Tresa Garter [id number]  . penicillin v potassium (VEETID) 500 MG tablet    Sig: Take 1 tablet (500 mg  total) by mouth 4 (four) times daily.    Dispense:  20 tablet    Refill:  0    Order Specific Question:   Supervising Provider    Answer:   Tresa Garter [id number]  . ibuprofen (ADVIL,MOTRIN) 600 MG tablet    Sig: Take 1 tablet (600 mg total) by mouth every 8 (eight) hours as needed.    Dispense:  30 tablet    Refill:  0    Order Specific Question:   Supervising Provider    Answer:   Tresa Garter [id number]  . nicotine (NICODERM CQ - DOSED IN MG/24 HOURS) 21 mg/24hr patch    Sig: Place 1 patch (21 mg total) onto the skin daily.    Dispense:  28 patch    Refill:  1    Order Specific Question:   Supervising Provider    Answer:   Tresa Garter [id number]    Follow-up: Return in about 8 weeks (around 09/01/2017) for HTN/DM.   Alfonse Spruce FNP

## 2017-07-28 MED FILL — METFORMIN HCL ER 500 MG TAB: 500 | 30 days supply | Qty: 60 | Fill #0

## 2017-07-28 MED FILL — LISINOPRIL 10 MG TABS: 10 | 30 days supply | Qty: 30 | Fill #4

## 2017-08-07 ENCOUNTER — Emergency Department (HOSPITAL_BASED_OUTPATIENT_CLINIC_OR_DEPARTMENT_OTHER)
Admission: EM | Admit: 2017-08-07 | Discharge: 2017-08-08 | Disposition: A | Discharge status: Home / Self Care | Payer: Self-pay | Attending: Emergency Medicine | Admitting: Emergency Medicine

## 2017-08-07 ENCOUNTER — Encounter (HOSPITAL_BASED_OUTPATIENT_CLINIC_OR_DEPARTMENT_OTHER): Payer: Self-pay | Admitting: *Deleted

## 2017-08-07 DIAGNOSIS — E119 Type 2 diabetes mellitus without complications: Secondary | ICD-10-CM | POA: Insufficient documentation

## 2017-08-07 DIAGNOSIS — Z79899 Other long term (current) drug therapy: Secondary | ICD-10-CM | POA: Insufficient documentation

## 2017-08-07 DIAGNOSIS — Z7984 Long term (current) use of oral hypoglycemic drugs: Secondary | ICD-10-CM | POA: Insufficient documentation

## 2017-08-07 DIAGNOSIS — L0291 Cutaneous abscess, unspecified: Secondary | ICD-10-CM

## 2017-08-07 DIAGNOSIS — L0201 Cutaneous abscess of face: Secondary | ICD-10-CM | POA: Insufficient documentation

## 2017-08-07 DIAGNOSIS — F17210 Nicotine dependence, cigarettes, uncomplicated: Secondary | ICD-10-CM | POA: Insufficient documentation

## 2017-08-07 DIAGNOSIS — I10 Essential (primary) hypertension: Secondary | ICD-10-CM | POA: Insufficient documentation

## 2017-08-07 NOTE — ED Triage Notes (Signed)
PT states he noticed a firm area on his right jaw line about two weeks ago, saw his doctor for the same, was told it was an abscess. Since then, the area has become larger, more painful. No drainage, fevers or dental pain.

## 2017-08-08 ENCOUNTER — Emergency Department (HOSPITAL_BASED_OUTPATIENT_CLINIC_OR_DEPARTMENT_OTHER): Payer: Self-pay

## 2017-08-08 LAB — BASIC METABOLIC PANEL
Anion gap: 9 (ref 5–15)
BUN: 23 mg/dL — AB (ref 6–20)
CHLORIDE: 102 mmol/L (ref 101–111)
CO2: 24 mmol/L (ref 22–32)
CREATININE: 1.04 mg/dL (ref 0.61–1.24)
Calcium: 9.3 mg/dL (ref 8.9–10.3)
GFR calc Af Amer: >60 mL/min (ref >60)
GFR calc non Af Amer: >60 mL/min (ref >60)
Glucose, Bld: 161 mg/dL — ABNORMAL HIGH (ref 65–99)
Potassium: 3.9 mmol/L (ref 3.5–5.1)
Sodium: 135 mmol/L (ref 135–145)

## 2017-08-08 MED ORDER — IOPAMIDOL (ISOVUE-300) INJECTION 61%
100.0000 mL | Freq: Once | INTRAVENOUS | Status: AC | PRN
  Administered 2017-08-08: 75 mL via INTRAVENOUS

## 2017-08-08 MED ORDER — LIDOCAINE-EPINEPHRINE (PF) 2 %-1:200000 IJ SOLN: 20.0000 mL | Freq: Once | INTRAMUSCULAR | Status: DC

## 2017-08-08 MED ORDER — PENICILLIN V POTASSIUM 500 MG PO TABS: 500.0000 mg | ORAL_TABLET | Freq: Four times a day (QID) | ORAL | 0 refills | Status: AC

## 2017-08-08 MED ORDER — HYDROCODONE-ACETAMINOPHEN 5-325 MG PO TABS
1.0000 | ORAL_TABLET | Freq: Once | ORAL | Status: AC
  Administered 2017-08-08: 1 via ORAL
  Filled 2017-08-08: qty 1

## 2017-08-08 NOTE — ED Provider Notes (Signed)
[facility] EMERGENCY DEPARTMENT Provider Note   CSN: [id number] Arrival date & time: 08/07/17  2257     History   Chief Complaint Chief Complaint  Patient presents with  . Abscess    HPI Kyle Campos is a 61 y.o. male.  HPI  This is a 61 year old male who present right neck swelling.  Patient reports 2-week history of increasing swelling of the right neck just below the jaw.  He saw his primary physician who "thought it might be an abscess."  Patient states that the area has progressively increased in size and become more painful.  He currently does not have any dental pain.  He does state that 1 month ago he had some right-sided lower dental pain that resolved spontaneously.  He reports that he was due to be referred to a dentist but has not heard back.  He denies any fevers or difficulty swallowing.  Current pain right now is 9 out of 10.  Past Medical History:  Diagnosis Date  . Colon polyps 03/14/2015   Tubular adenoma x 6  . Diabetes mellitus ([place])   . Diverticulosis   . Hepatic steatosis   . Lung nodules     Patient Active Problem List   Diagnosis Date Noted  . Chronic midline thoracic back pain 09/15/2016  . Essential hypertension 09/15/2016  . Chronic midline posterior neck pain 06/27/2016  . Diabetes type 2, controlled ([place]) 06/19/2015  . Tinea pedis 06/19/2015  . Smoking 01/02/2015    Past Surgical History:  Procedure Laterality Date  . APPENDECTOMY         Home Medications    Prior to Admission medications   Medication Sig Start Date End Date Taking? Authorizing Provider  celecoxib (CELEBREX) 200 MG capsule Take 1 capsule (200 mg total) by mouth 2 (two) times daily. 05/17/17   Alfonse Spruce, FNP  fluticasone (FLONASE) 50 MCG/ACT nasal spray Place 2 sprays into both nostrils daily. 05/17/17   Fredia Beets R, FNP  glucose blood test strip Use as instructed 07/07/17   Alfonse Spruce, FNP  guaiFENesin (MUCINEX) 600 MG 12  hr tablet Take 1 tablet (600 mg total) by mouth 2 (two) times daily. 05/17/17   Alfonse Spruce, FNP  guaiFENesin-dextromethorphan (ROBITUSSIN DM) 100-10 MG/5ML syrup Take 5 mLs by mouth every 4 (four) hours as needed for cough. 05/17/17   Alfonse Spruce, FNP  ibuprofen (ADVIL,MOTRIN) 600 MG tablet Take 1 tablet (600 mg total) by mouth every 8 (eight) hours as needed. 07/07/17   Alfonse Spruce, FNP  lisinopril (PRINIVIL,ZESTRIL) 10 MG tablet Take 1 tablet (10 mg total) by mouth daily. 05/17/17   Alfonse Spruce, FNP  metFORMIN (GLUCOPHAGE XR) 500 MG 24 hr tablet Take 1 tablet (500 mg total) by mouth 2 (two) times daily with a meal. 07/07/17   Hairston, Mandesia R, FNP  nicotine (NICODERM CQ - DOSED IN MG/24 HOURS) 21 mg/24hr patch Place 1 patch (21 mg total) onto the skin daily. 07/07/17   Alfonse Spruce, FNP  penicillin v potassium (VEETID) 500 MG tablet Take 1 tablet (500 mg total) 4 (four) times daily for 7 days by mouth. 08/08/17 08/15/17  Merryl Hacker, MD    Family History Family History  Problem Relation Age of Onset  . Diabetes Mother   . Cancer Father        lung cancer   . Stroke Maternal Grandfather   . Colon cancer Neg Hx  Social History Social History   Tobacco Use  . Smoking status: Current Every Day Smoker    Packs/day: 0.50    Years: 40.00    Pack years: 20.00    Types: Cigarettes  . Smokeless tobacco: Never Used  Substance Use Topics  . Alcohol use: Yes    Alcohol/week: 0.0 oz    Comment: once monthly  . Drug use: No     Allergies   Patient has no known allergies.   Review of Systems Review of Systems  Constitutional: Negative for fever.  HENT: Positive for facial swelling. Negative for dental problem and trouble swallowing.   Respiratory: Negative for shortness of breath.   Cardiovascular: Negative for chest pain.  All other systems reviewed and are negative.    Physical Exam Updated Vital Signs BP 122/84 (BP  Location: Left Arm)   Pulse 89   Temp 98.2 F (36.8 C) (Oral)   Resp 20   SpO2 98%   Physical Exam  Constitutional: He is oriented to person, place, and time. He appears well-developed and well-nourished.  HENT:  Head: Normocephalic.  4 x 4 centimeter area of induration over the right submandibular region, no significant overlying erythema, fluctuance noted, nonmobile, no obvious abscess along the gumline, dental caries noted  Neck: Normal range of motion. Neck supple.  Cardiovascular: Normal rate, regular rhythm and normal heart sounds.  No murmur heard. Pulmonary/Chest: Effort normal and breath sounds normal. No respiratory distress.  Abdominal: Soft. There is no tenderness.  Neurological: He is alert and oriented to person, place, and time.  Skin: Skin is warm and dry.  Psychiatric: He has a normal mood and affect.  Nursing note and vitals reviewed.    ED Treatments / Results  Labs (all labs ordered are listed, but only abnormal results are displayed) Labs Reviewed  BASIC METABOLIC PANEL - Abnormal; Notable for the following components:      Result Value   Glucose, Bld 161 (*)    BUN 23 (*)    All other components within normal limits    EKG  EKG Interpretation None       Radiology Ct Soft Tissue Neck W Contrast  Result Date: 08/08/2017 CLINICAL DATA:  RIGHT jaw on abscess for 2 weeks, progressively larger and painful. EXAM: CT NECK WITH CONTRAST TECHNIQUE: Multidetector CT imaging of the neck was performed using the standard protocol following the bolus administration of intravenous contrast. CONTRAST:  5mL ISOVUE-300 IOPAMIDOL (ISOVUE-300) INJECTION 61% COMPARISON:  None. FINDINGS: PHARYNX AND LARYNX: Normal.  Widely patent airway. SALIVARY GLANDS: Normal. Mass effect on RIGHT submandibular gland by abscess though, appears separate from the gland. THYROID: Normal. LYMPH NODES: No lymphadenopathy by CT size criteria. VASCULAR: Trace calcific atherosclerosis aortic  arch. LIMITED INTRACRANIAL: Normal. VISUALIZED ORBITS: Normal. MASTOIDS AND VISUALIZED PARANASAL SINUSES: Mild paranasal sinus mucosal thickening. Mastoid air cells are well aerated. SKELETON: Dental carie tooth 14. Tooth 31 periapical abscess with focal osseous dehiscence (sagittal 39/101). Mild degenerative changes cervical spine. UPPER CHEST: Lung apices are clear. No superior mediastinal lymphadenopathy. OTHER: 3 x 2 cm rim enhancing fluid collection within RIGHT submandibular soft tissues, potentially within RIGHT submandibular space. RIGHT platysma thickening with RIGHT lower face/upper neck soft tissue swelling, subcutaneous fat stranding. No subcutaneous gas or radiopaque foreign bodies. IMPRESSION: 1. 3 x 2 mm abscess RIGHT submandibular soft tissues with RIGHT lower face and upper neck cellulitis. Mass abuts the RIGHT submandibular gland though appears separate. 2. Tooth 31 periapical abscess and bony dehiscence, possible etiology  of RIGHT face abscess though, not contiguous with collection. 3. Recommend follow-up contrast-enhanced CT or MRI neck after resolution of acute symptoms to exclude RIGHT submandibular gland mass. Aortic Atherosclerosis (ICD10-I70.0). Electronically Signed   By: Elon Alas M.D.   On: 08/08/2017 02:11    Procedures .Marland KitchenIncision and Drainage Date/Time: 08/08/2017 3:32 AM Performed by: Merryl Hacker, MD Authorized by: Merryl Hacker, MD   Consent:    Consent obtained:  Verbal   Consent given by:  Patient   Risks discussed:  Bleeding, pain, infection and damage to other organs   Alternatives discussed:  No treatment Location:    Type:  Abscess   Location:  Head   Head location:  Face Pre-procedure details:    Skin preparation:  Betadine and Chloraprep Anesthesia (see MAR for exact dosages):    Anesthesia method:  Local infiltration   Local anesthetic:  Lidocaine 2% WITH epi Procedure type:    Complexity:  Simple Procedure details:    Needle  aspiration: yes     Needle size:  18 G   Incision types:  Stab incision   Scalpel blade:  11   Drainage:  Purulent   Drainage amount:  Moderate   Packing materials:  None Post-procedure details:    Patient tolerance of procedure:  Tolerated well, no immediate complications Comments:     Initially stab incision was made with an 11-gauge scalpel; however, difficult to access abscess given depth.  Transition to needle aspiration with 18-gauge needle.  3 cc of purulent drainage removed.   (including critical care time)  Medications Ordered in ED Medications  lidocaine-EPINEPHrine (XYLOCAINE W/EPI) 2 %-1:200000 (PF) injection 20 mL (not administered)  HYDROcodone-acetaminophen (NORCO/VICODIN) 5-325 MG per tablet 1 tablet (1 tablet Oral Given 08/08/17 0041)  iopamidol (ISOVUE-300) 61 % injection 100 mL (75 mLs Intravenous Contrast Given 08/08/17 0129)     Initial Impression / Assessment and Plan / ED Course  I have reviewed the triage vital signs and the nursing notes.  Pertinent labs & imaging results that were available during my care of the patient were reviewed by me and considered in my medical decision making (see chart for details).     Patient presents with swelling of the right face and mandible.  Nontoxic appearing.  Airway intact.  Location is somewhat concerning given adjacent structures.  CT scan was obtained to better classify likely abscess.  CT scan notable for a 2 x 3 cm abscess in the submandibular region.  There is adjacent dental infection but it is unclear whether this is the origin.  Furthermore, recommend repeat CT with resolution to rule out submandibular mass.  Discussed with patient the risk and benefits of incision and drainage.  Abscess was drained with some improvement of pain.  No immediate complications.  We will placed on penicillin for presumed dental infection.  Follow-up with dentistry.  Patient was given strict return precautions.  After history, exam, and  medical workup I feel the patient has been appropriately medically screened and is safe for discharge home. Pertinent diagnoses were discussed with the patient. Patient was given return precautions.   Final Clinical Impressions(s) / ED Diagnoses   Final diagnoses:  Abscess    ED Discharge Orders        Ordered    penicillin v potassium (VEETID) 500 MG tablet  4 times daily     08/08/17 0313       Horton, Barbette Hair, MD 08/08/17 [phone]

## 2017-08-08 NOTE — ED Notes (Signed)
ED Provider at bedside. 

## 2017-08-08 NOTE — Discharge Instructions (Signed)
You are seen today for an abscess of her right jaw.  This may be related to a dental abscess.  Take antibiotics as directed.  Follow-up with dentistry.  You need to have a repeat CT scan after resolution to make sure that your submandibular gland was not involved.  If you develop any increasing redness, worsening swelling, fevers, or any new or worsening symptoms you should be reevaluated.

## 2017-08-08 NOTE — ED Notes (Signed)
Pt given d/c instructions as per chart. Rx x 1. Verbalizes understanding. No questions. 

## 2017-08-11 MED FILL — CELECOXIB 200 MG CAPSULE: 200 | 30 days supply | Qty: 60 | Fill #1

## 2017-08-16 ENCOUNTER — Ambulatory Visit (HOSPITAL_COMMUNITY)
Admission: RE | Admit: 2017-08-16 | Discharge: 2017-08-16 | Disposition: A | Discharge status: Home / Self Care | Payer: Self-pay | Source: Ambulatory Visit | Attending: Family Medicine | Admitting: Family Medicine

## 2017-08-16 DIAGNOSIS — R05 Cough: Secondary | ICD-10-CM | POA: Insufficient documentation

## 2017-08-16 DIAGNOSIS — F17200 Nicotine dependence, unspecified, uncomplicated: Secondary | ICD-10-CM | POA: Insufficient documentation

## 2017-08-22 ENCOUNTER — Ambulatory Visit: Payer: Self-pay | Attending: Family Medicine | Admitting: Family Medicine

## 2017-08-22 ENCOUNTER — Encounter: Payer: Self-pay | Admitting: Family Medicine

## 2017-08-22 VITALS — BP 121/85 | HR 85 | Temp 98.1°F | Resp 16 | Wt 232.0 lb

## 2017-08-22 DIAGNOSIS — Z7951 Long term (current) use of inhaled steroids: Secondary | ICD-10-CM | POA: Insufficient documentation

## 2017-08-22 DIAGNOSIS — Z791 Long term (current) use of non-steroidal anti-inflammatories (NSAID): Secondary | ICD-10-CM | POA: Insufficient documentation

## 2017-08-22 DIAGNOSIS — Z7984 Long term (current) use of oral hypoglycemic drugs: Secondary | ICD-10-CM | POA: Insufficient documentation

## 2017-08-22 DIAGNOSIS — Z760 Encounter for issue of repeat prescription: Secondary | ICD-10-CM

## 2017-08-22 DIAGNOSIS — M549 Dorsalgia, unspecified: Secondary | ICD-10-CM | POA: Insufficient documentation

## 2017-08-22 DIAGNOSIS — Z79899 Other long term (current) drug therapy: Secondary | ICD-10-CM | POA: Insufficient documentation

## 2017-08-22 DIAGNOSIS — F17200 Nicotine dependence, unspecified, uncomplicated: Secondary | ICD-10-CM | POA: Insufficient documentation

## 2017-08-22 DIAGNOSIS — I10 Essential (primary) hypertension: Secondary | ICD-10-CM

## 2017-08-22 DIAGNOSIS — E1121 Type 2 diabetes mellitus with diabetic nephropathy: Secondary | ICD-10-CM

## 2017-08-22 LAB — GLUCOSE, POCT (MANUAL RESULT ENTRY): POC GLUCOSE: 177 mg/dL — AB (ref 70–99)

## 2017-08-22 LAB — POCT GLYCOSYLATED HEMOGLOBIN (HGB A1C): HEMOGLOBIN A1C: 6.5

## 2017-08-22 MED ORDER — METFORMIN HCL ER 500 MG PO TB24: 500.0000 mg | ORAL_TABLET | Freq: Two times a day (BID) | ORAL | 5 refills | Status: DC

## 2017-08-22 MED ORDER — CELECOXIB 200 MG PO CAPS: 200.0000 mg | ORAL_CAPSULE | Freq: Two times a day (BID) | ORAL | 1 refills | Status: DC

## 2017-08-22 MED ORDER — LISINOPRIL 10 MG PO TABS: 10.0000 mg | ORAL_TABLET | Freq: Every day | ORAL | 3 refills | Status: DC

## 2017-08-22 NOTE — Patient Instructions (Signed)
Type 2 Diabetes Mellitus, Self Care, Adult When you have type 2 diabetes (type 2 diabetes mellitus), you must keep your blood sugar (glucose) under control. You can do this with:  Nutrition.  Exercise.  Lifestyle changes.  Medicines or insulin, if needed.  Support from your doctors and others.  How do I manage my blood sugar?  Check your blood sugar level every day, as often as told.  Call your doctor if your blood sugar is above your goal numbers for 2 tests in a row.  Have your A1c (hemoglobin A1c) level checked at least two times a year. Have it checked more often if your doctor tells you to. Your doctor will set treatment goals for you. Generally, you should have these blood sugar levels:  Before meals (preprandial): 80-130 mg/dL (4.4-7.2 mmol/L).  After meals (postprandial): lower than 180 mg/dL (10 mmol/L).  A1c level: less than 7%.  What do I need to know about high blood sugar? High blood sugar is called hyperglycemia. Know the signs of high blood sugar. Signs may include:  Feeling: ? Thirsty. ? Hungry. ? Very tired.  Needing to pee (urinate) more than usual.  Blurry vision.  What do I need to know about low blood sugar? Low blood sugar is called hypoglycemia. This is when blood sugar is at or below 70 mg/dL (3.9 mmol/L). Symptoms may include:  Feeling: ? Hungry. ? Worried or nervous (anxious). ? Sweaty and clammy. ? Confused. ? Dizzy. ? Sleepy. ? Sick to your stomach (nauseous).  Having: ? A fast heartbeat (palpitations). ? A headache. ? A change in your vision. ? Jerky movements that you cannot control (seizure). ? Nightmares. ? Tingling or no feeling (numbness) around the mouth, lips, or tongue.  Having trouble with: ? Talking. ? Paying attention (concentrating). ? Moving (coordination). ? Sleeping.  Shaking.  Passing out (fainting).  Getting upset easily (irritability).  Treating low blood sugar  To treat low blood sugar, eat or  drink something sugary right away. If you can think clearly and swallow safely, follow the 15:15 rule:  Take 15 grams of a fast-acting carb (carbohydrate). Some fast-acting carbs are: ? 1 tube of glucose gel. ? 3 sugar tablets (glucose pills). ? 6-8 pieces of hard candy. ? 4 oz (120 mL) of fruit juice. ? 4 oz (120 mL) regular (not diet) soda.  Check your blood sugar 15 minutes after you take the carb.  If your blood sugar is still at or below 70 mg/dL (3.9 mmol/L), take 15 grams of a carb again.  If your blood sugar does not go above 70 mg/dL (3.9 mmol/L) after 3 tries, get help right away.  After your blood sugar goes back to normal, eat a meal or a snack within 1 hour.  Treating very low blood sugar If your blood sugar is at or below 54 mg/dL (3 mmol/L), you have very low blood sugar (severe hypoglycemia). This is an emergency. Do not wait to see if the symptoms will go away. Get medical help right away. Call your local emergency services (911 in the [place] Do not drive yourself to the hospital. If you have very low blood sugar and you cannot eat or drink, you may need a glucagon shot (injection). A family member or friend should learn how to check your blood sugar and how to give you a glucagon shot. Ask your doctor if you need to have a glucagon shot kit at home. What else is important to manage my diabetes? Medicine  Follow these instructions about insulin and diabetes medicines:  Take them as told by your doctor.  Adjust them as told by your doctor.  Do not run out of them.  Having diabetes can raise your risk for other long-term conditions. These include heart or kidney disease. Your doctor may prescribe medicines to help prevent problems from diabetes. Food   Make healthy food choices. These include: ? Chicken, fish, egg whites, and beans. ? Oats, whole wheat, bulgur, brown rice, quinoa, and millet. ? Fresh fruits and vegetables. ? Low-fat dairy products. ? Nuts,  avocado, olive oil, and canola oil.  Make a food plan with a specialist (dietitian).  Follow instructions from your doctor about what you cannot eat or drink.  Drink enough fluid to keep your pee (urine) clear or pale yellow.  Eat healthy snacks between healthy meals.  Keep track of carbs that you eat. Read food labels. Learn food serving sizes.  Follow your sick day plan when you cannot eat or drink normally. Make this plan with your doctor so it is ready to use. Activity  Exercise at least 3 times a week.  Do not go more than 2 days without exercising.  Talk with your doctor before you start a new exercise. Your doctor may need to adjust your insulin, medicines, or food. Lifestyle   Do not use any tobacco products. These include cigarettes, chewing tobacco, and e-cigarettes.If you need help quitting, ask your doctor.  Ask your doctor how much alcohol is safe for you.  Learn to deal with stress. If you need help with this, ask your doctor. Body care  Stay up to date with your shots (immunizations).  Have your eyes and feet checked by a doctor as often as told.  Check your skin and feet every day. Check for cuts, bruises, redness, blisters, or sores.  Brush your teeth and gums two times a day.  Floss at least one time a day.  Go to the dentist least one time every 6 months.  Stay at a healthy weight. General instructions   Take over-the-counter and prescription medicines only as told by your doctor.  Share your diabetes care plan with: ? Your work or school. ? People you live with.  Check your pee (urine) for ketones: ? When you are sick. ? As told by your doctor.  Carry a card or wear jewelry that says that you have diabetes.  Ask your doctor: ? Do I need to meet with a diabetes educator? ? Where can I find a support group for people with diabetes?  Keep all follow-up visits as told by your doctor. This is important. Where to find more information: To  learn more about diabetes, visit:  American Diabetes Association: www.diabetes.org  American Association of Diabetes Educators: www.diabeteseducator.org/patient-resources  This information is not intended to replace advice given to you by your health care provider. Make sure you discuss any questions you have with your health care provider. Document Released: 01/12/2016 Document Revised: 02/26/2016 Document Reviewed: 10/24/2015 Elsevier Interactive Patient Education  [employer].

## 2017-08-23 ENCOUNTER — Telehealth: Payer: Self-pay

## 2017-08-23 LAB — LIPID PANEL
CHOLESTEROL TOTAL: 173 mg/dL (ref 100–199)
Chol/HDL Ratio: 4.8 ratio (ref 0.0–5.0)
HDL: 36 mg/dL — AB (ref >39)
LDL Calculated: 68 mg/dL (ref 0–99)
TRIGLYCERIDES: 345 mg/dL — AB (ref 0–149)
VLDL Cholesterol Cal: 69 mg/dL — ABNORMAL HIGH (ref 5–40)

## 2017-08-23 NOTE — Progress Notes (Signed)
Subjective:  Patient ID: Kyle Campos, male    DOB: [birth year]  Age: 61 y.o. MRN: [id number]  CC: Diabetes   HPI Kyle Campos presents for DM, HTN follow up. He complains of back pain.  History of  DM. Symptoms: none. Patient denies foot ulcerations, hyperglycemia, nausea, paresthesia of the feet, polydipsia, polyuria, visual disturbances and vomitting.  Evaluation to date has been included: fasting blood sugar and hemoglobin A1C.  Home sugars: patient is  not checking sugars. Treatment to date: metformin. History of HTN.  He is not exercising and is not adherent to low salt diet.  BP well controlled at home. Cardiac symptoms none. Patient denies chest pain, chest pressure/discomfort, claudication, lower extremity edema, near-syncope and syncope.  Cardiovascular risk factors: advanced age (older than 72 for men, 86 for women), diabetes mellitus, hypertension, male gender, sedentary lifestyle and smoking/ tobacco exposure 40 year smoking history. He is not ready to quit at this time.  Outpatient Medications Prior to Visit  Medication Sig Dispense Refill  . fluticasone (FLONASE) 50 MCG/ACT nasal spray Place 2 sprays into both nostrils daily. 16 g 6  . glucose blood test strip Use as instructed 100 each 12  . celecoxib (CELEBREX) 200 MG capsule Take 1 capsule (200 mg total) by mouth 2 (two) times daily. 60 capsule 1  . lisinopril (PRINIVIL,ZESTRIL) 10 MG tablet Take 1 tablet (10 mg total) by mouth daily. 90 tablet 3  . metFORMIN (GLUCOPHAGE XR) 500 MG 24 hr tablet Take 1 tablet (500 mg total) by mouth 2 (two) times daily with a meal. 60 tablet 2  . ibuprofen (ADVIL,MOTRIN) 600 MG tablet Take 1 tablet (600 mg total) by mouth every 8 (eight) hours as needed. 30 tablet 0  . nicotine (NICODERM CQ - DOSED IN MG/24 HOURS) 21 mg/24hr patch Place 1 patch (21 mg total) onto the skin daily. (Patient not taking: Reported on 08/22/2017) 28 patch 1  . guaiFENesin (MUCINEX) 600 MG 12 hr tablet Take 1 tablet  (600 mg total) by mouth 2 (two) times daily. (Patient not taking: Reported on 08/22/2017) 14 tablet 0  . guaiFENesin-dextromethorphan (ROBITUSSIN DM) 100-10 MG/5ML syrup Take 5 mLs by mouth every 4 (four) hours as needed for cough. (Patient not taking: Reported on 08/22/2017) 236 mL 1   Facility-Administered Medications Prior to Visit  Medication Dose Route Frequency Provider Last Rate Last Dose  . ipratropium-albuterol (DUONEB) 0.5-2.5 (3) MG/3ML nebulizer solution 3 mL  3 mL Nebulization Q20 Min PRN Alekxander Isola R, FNP        ROS Review of Systems  Constitutional: Negative.   Eyes: Negative.   Respiratory: Negative.   Cardiovascular: Negative.   Gastrointestinal: Negative.   Endocrine: Negative.   Skin: Negative.   Neurological: Negative.   Psychiatric/Behavioral: Negative.     Objective:  BP 121/85 (BP Location: Left Arm, Patient Position: Sitting, Cuff Size: Large)   Pulse 85   Temp 98.1 F (36.7 C) (Oral)   Resp 16   Wt 232 lb (105.2 kg)   SpO2 95%   BMI 30.61 kg/m   BP/Weight 08/22/2017 08/08/2017 32/06/5187  Systolic BP 416 606 301  Diastolic BP 85 76 76  Wt. (Lbs) 232 - 221.4  BMI 30.61 - 29.21   Physical Exam  Constitutional: He appears well-developed and well-nourished.  HENT:  Head: Normocephalic and atraumatic.  Right Ear: External ear normal.  Left Ear: External ear normal.  Mouth/Throat: Oropharynx is clear and moist.  Eyes: Pupils are equal, round, and  reactive to light.  Neck: No JVD present.  Cardiovascular: Normal rate, regular rhythm, normal heart sounds and intact distal pulses.  Pulmonary/Chest: Effort normal.  Abdominal: Soft. Bowel sounds are normal. There is no tenderness.  Skin: Skin is warm and dry.  Psychiatric: He has a normal mood and affect.  Nursing note and vitals reviewed.  Assessment & Plan:   1. Controlled type 2 diabetes mellitus with diabetic nephropathy, without long-term current use of insulin (HCC)  - metFORMIN  (GLUCOPHAGE XR) 500 MG 24 hr tablet; Take 1 tablet (500 mg total) 2 (two) times daily with a meal by mouth.  Dispense: 60 tablet; Refill: 5 - Ambulatory referral to Ophthalmology - Lipid Panel - HgB A1c  2. Essential hypertension  - Glucose (CBG) - lisinopril (PRINIVIL,ZESTRIL) 10 MG tablet; Take 1 tablet (10 mg total) daily by mouth.  Dispense: 90 tablet; Refill: 3 - Lipid Panel  3. Medication refill  - celecoxib (CELEBREX) 200 MG capsule; Take 1 capsule (200 mg total) 2 (two) times daily by mouth.  Dispense: 60 capsule; Refill: 1       Follow-up: Return in about 3 months (around 11/22/2017) for HTN/DM.   Alfonse Spruce FNP

## 2017-08-23 NOTE — Telephone Encounter (Signed)
CMA call regarding x ray results   Patient Verify DOB   Patient was aware and understood

## 2017-08-23 NOTE — Telephone Encounter (Signed)
-----   Message from Alfonse Spruce, [place] sent at 08/19/2017  5:05 PM EST ----- Negative chest xray. No fluid in the lungs or collapse of lung. No heart enlargement. Thoracic spine shows osteoarthritis.

## 2017-08-30 MED FILL — !CELEBREX 200 MG CAPSULE: 200 | 30 days supply | Qty: 60 | Fill #0

## 2017-08-30 MED FILL — METFORMIN HCL ER 500 MG TAB: 500 | 30 days supply | Qty: 60 | Fill #0

## 2017-08-30 MED FILL — LISINOPRIL 10 MG TABS: 10 | 30 days supply | Qty: 30 | Fill #0

## 2017-09-14 MED FILL — raNITIdine HCL 150 MG TABS: 150 | 30 days supply | Qty: 30 | Fill #1

## 2017-10-03 MED FILL — LISINOPRIL 10 MG TABS: 10 | 30 days supply | Qty: 30 | Fill #1

## 2017-11-10 MED FILL — LISINOPRIL 10 MG TABS: 10 | 30 days supply | Qty: 30 | Fill #2

## 2017-11-14 MED FILL — raNITIdine HCL 150 MG TABS: 150 | 30 days supply | Qty: 30 | Fill #2

## 2017-11-17 ENCOUNTER — Encounter: Payer: Self-pay | Admitting: Family Medicine

## 2017-11-17 ENCOUNTER — Ambulatory Visit: Payer: Self-pay | Attending: Family Medicine | Admitting: Family Medicine

## 2017-11-17 VITALS — BP 113/70 | HR 84 | Temp 97.9°F | Resp 16 | Ht 73.0 in | Wt 228.4 lb

## 2017-11-17 DIAGNOSIS — I10 Essential (primary) hypertension: Secondary | ICD-10-CM | POA: Insufficient documentation

## 2017-11-17 DIAGNOSIS — Z87891 Personal history of nicotine dependence: Secondary | ICD-10-CM | POA: Insufficient documentation

## 2017-11-17 DIAGNOSIS — Z13220 Encounter for screening for lipoid disorders: Secondary | ICD-10-CM | POA: Insufficient documentation

## 2017-11-17 DIAGNOSIS — Z7984 Long term (current) use of oral hypoglycemic drugs: Secondary | ICD-10-CM | POA: Insufficient documentation

## 2017-11-17 DIAGNOSIS — Z79899 Other long term (current) drug therapy: Secondary | ICD-10-CM | POA: Insufficient documentation

## 2017-11-17 DIAGNOSIS — E1121 Type 2 diabetes mellitus with diabetic nephropathy: Secondary | ICD-10-CM | POA: Insufficient documentation

## 2017-11-17 LAB — GLUCOSE, POCT (MANUAL RESULT ENTRY): POC Glucose: 205 mg/dl — AB (ref 70–99)

## 2017-11-17 MED ORDER — LISINOPRIL 10 MG PO TABS: 10.0000 mg | ORAL_TABLET | Freq: Every day | ORAL | 1 refills | Status: DC

## 2017-11-17 NOTE — Patient Instructions (Signed)
Diabetes Mellitus and Nutrition When you have diabetes (diabetes mellitus), it is very important to have healthy eating habits because your blood sugar (glucose) levels are greatly affected by what you eat and drink. Eating healthy foods in the appropriate amounts, at about the same times every day, can help you:  Control your blood glucose.  Lower your risk of heart disease.  Improve your blood pressure.  Reach or maintain a healthy weight.  Every person with diabetes is different, and each person has different needs for a meal plan. Your health care provider may recommend that you work with a diet and nutrition specialist (dietitian) to make a meal plan that is best for you. Your meal plan may vary depending on factors such as:  The calories you need.  The medicines you take.  Your weight.  Your blood glucose, blood pressure, and cholesterol levels.  Your activity level.  Other health conditions you have, such as heart or kidney disease.  How do carbohydrates affect me? Carbohydrates affect your blood glucose level more than any other type of food. Eating carbohydrates naturally increases the amount of glucose in your blood. Carbohydrate counting is a method for keeping track of how many carbohydrates you eat. Counting carbohydrates is important to keep your blood glucose at a healthy level, especially if you use insulin or take certain oral diabetes medicines. It is important to know how many carbohydrates you can safely have in each meal. This is different for every person. Your dietitian can help you calculate how many carbohydrates you should have at each meal and for snack. Foods that contain carbohydrates include:  Bread, cereal, rice, pasta, and crackers.  Potatoes and corn.  Peas, beans, and lentils.  Milk and yogurt.  Fruit and juice.  Desserts, such as cakes, cookies, ice cream, and candy.  How does alcohol affect me? Alcohol can cause a sudden decrease in blood  glucose (hypoglycemia), especially if you use insulin or take certain oral diabetes medicines. Hypoglycemia can be a life-threatening condition. Symptoms of hypoglycemia (sleepiness, dizziness, and confusion) are similar to symptoms of having too much alcohol. If your health care provider says that alcohol is safe for you, follow these guidelines:  Limit alcohol intake to no more than 1 drink per day for nonpregnant women and 2 drinks per day for men. One drink equals 12 oz of beer, 5 oz of wine, or 1 oz of hard liquor.  Do not drink on an empty stomach.  Keep yourself hydrated with water, diet soda, or unsweetened iced tea.  Keep in mind that regular soda, juice, and other mixers may contain a lot of sugar and must be counted as carbohydrates.  What are tips for following this plan? Reading food labels  Start by checking the serving size on the label. The amount of calories, carbohydrates, fats, and other nutrients listed on the label are based on one serving of the food. Many foods contain more than one serving per package.  Check the total grams (g) of carbohydrates in one serving. You can calculate the number of servings of carbohydrates in one serving by dividing the total carbohydrates by 15. For example, if a food has 30 g of total carbohydrates, it would be equal to 2 servings of carbohydrates.  Check the number of grams (g) of saturated and trans fats in one serving. Choose foods that have low or no amount of these fats.  Check the number of milligrams (mg) of sodium in one serving. Most people   should limit total sodium intake to less than 2,300 mg per day.  Always check the nutrition information of foods labeled as "low-fat" or "nonfat". These foods may be higher in added sugar or refined carbohydrates and should be avoided.  Talk to your dietitian to identify your daily goals for nutrients listed on the label. Shopping  Avoid buying canned, premade, or processed foods. These  foods tend to be high in fat, sodium, and added sugar.  Shop around the outside edge of the grocery store. This includes fresh fruits and vegetables, bulk grains, fresh meats, and fresh dairy. Cooking  Use low-heat cooking methods, such as baking, instead of high-heat cooking methods like deep frying.  Cook using healthy oils, such as olive, canola, or sunflower oil.  Avoid cooking with butter, cream, or high-fat meats. Meal planning  Eat meals and snacks regularly, preferably at the same times every day. Avoid going long periods of time without eating.  Eat foods high in fiber, such as fresh fruits, vegetables, beans, and whole grains. Talk to your dietitian about how many servings of carbohydrates you can eat at each meal.  Eat 4-6 ounces of lean protein each day, such as lean meat, chicken, fish, eggs, or tofu. 1 ounce is equal to 1 ounce of meat, chicken, or fish, 1 egg, or 1/4 cup of tofu.  Eat some foods each day that contain healthy fats, such as avocado, nuts, seeds, and fish. Lifestyle   Check your blood glucose regularly.  Exercise at least 30 minutes 5 or more days each week, or as told by your health care provider.  Take medicines as told by your health care provider.  Do not use any products that contain nicotine or tobacco, such as cigarettes and e-cigarettes. If you need help quitting, ask your health care provider.  Work with a counselor or diabetes educator to identify strategies to manage stress and any emotional and social challenges. What are some questions to ask my health care provider?  Do I need to meet with a diabetes educator?  Do I need to meet with a dietitian?  What number can I call if I have questions?  When are the best times to check my blood glucose? Where to find more information:  American Diabetes Association: diabetes.org/food-and-fitness/food  Academy of Nutrition and Dietetics:  www.eatright.org/resources/health/diseases-and-conditions/diabetes  National Institute of Diabetes and Digestive and Kidney Diseases (NIH): www.niddk.nih.gov/health-information/diabetes/overview/diet-eating-physical-activity Summary  A healthy meal plan will help you control your blood glucose and maintain a healthy lifestyle.  Working with a diet and nutrition specialist (dietitian) can help you make a meal plan that is best for you.  Keep in mind that carbohydrates and alcohol have immediate effects on your blood glucose levels. It is important to count carbohydrates and to use alcohol carefully. This information is not intended to replace advice given to you by your health care provider. Make sure you discuss any questions you have with your health care provider. Document Released: 06/17/2005 Document Revised: 10/25/2016 Document Reviewed: 10/25/2016 Elsevier Interactive Patient Education  2018 Elsevier Inc.  

## 2017-11-17 NOTE — Progress Notes (Signed)
Patient is here for a follow-up on hypertension and Diabetes.  Patient have his usual back pain.

## 2017-11-17 NOTE — Progress Notes (Signed)
Subjective:  Patient ID: Kyle Campos, male    DOB: 01/29/1956  Age: 62 y.o. MRN: [id number]  CC: Follow-up   HPI Kyle Campos presents for DM, HTN follow up. History of  DM. Symptoms: none. Patient denies foot ulcerations, hyperglycemia, nausea, paresthesia of the feet, polydipsia, polyuria, visual disturbances and vomitting.  Evaluation to date has been included: fasting blood sugar, lipid, and hemoglobin A1C.  Home sugars: patient is  not checking sugars. Treatment to date: metformin. History of HTN.  He is not exercising. He is adherent to low salt diet.  BP well controlled at home. Cardiac symptoms none. Patient denies chest pain, chest pressure/discomfort, claudication, lower extremity edema, near-syncope and syncope.  Cardiovascular risk factors: advanced age (older than 76 for men, 58 for women), diabetes mellitus, hypertension, male gender, sedentary lifestyle and smoking/ tobacco exposure 40 year smoking history. He is not ready to quit at this time.  Outpatient Medications Prior to Visit  Medication Sig Dispense Refill  . celecoxib (CELEBREX) 200 MG capsule Take 1 capsule (200 mg total) 2 (two) times daily by mouth. 60 capsule 1  . ibuprofen (ADVIL,MOTRIN) 600 MG tablet Take 1 tablet (600 mg total) by mouth every 8 (eight) hours as needed. 30 tablet 0  . metFORMIN (GLUCOPHAGE XR) 500 MG 24 hr tablet Take 1 tablet (500 mg total) 2 (two) times daily with a meal by mouth. 60 tablet 5  . lisinopril (PRINIVIL,ZESTRIL) 10 MG tablet Take 1 tablet (10 mg total) daily by mouth. 90 tablet 3  . fluticasone (FLONASE) 50 MCG/ACT nasal spray Place 2 sprays into both nostrils daily. (Patient not taking: Reported on 11/17/2017) 16 g 6  . glucose blood test strip Use as instructed (Patient not taking: Reported on 11/17/2017) 100 each 12  . nicotine (NICODERM CQ - DOSED IN MG/24 HOURS) 21 mg/24hr patch Place 1 patch (21 mg total) onto the skin daily. (Patient not taking: Reported on 08/22/2017) 28  patch 1   Facility-Administered Medications Prior to Visit  Medication Dose Route Frequency Provider Last Rate Last Dose  . ipratropium-albuterol (DUONEB) 0.5-2.5 (3) MG/3ML nebulizer solution 3 mL  3 mL Nebulization Q20 Min PRN Eusevio Schriver R, FNP        ROS Review of Systems  Constitutional: Negative.   Eyes: Negative for visual disturbance.  Respiratory: Negative.   Cardiovascular: Negative.   Gastrointestinal: Negative.   Endocrine: Negative.   Skin: Negative.   Neurological: Negative for dizziness, syncope and numbness.    Objective:  BP 113/70 (BP Location: Left Arm, Patient Position: Sitting, Cuff Size: Normal)   Pulse 84   Temp 97.9 F (36.6 C) (Oral)   Resp 16   Ht 6\' 1"  (1.854 m)   Wt 228 lb 6.4 oz (103.6 kg)   SpO2 95%   BMI 30.13 kg/m   BP/Weight 11/17/2017 08/22/2017 75/10/256  Systolic BP 527 782 423  Diastolic BP 70 85 76  Wt. (Lbs) 228.4 232 -  BMI 30.13 30.61 -     Physical Exam  Constitutional: He appears well-developed and well-nourished.  Eyes: Conjunctivae are normal. Pupils are equal, round, and reactive to light.  Neck: Normal range of motion. Neck supple. No JVD present.  Cardiovascular: Normal rate, regular rhythm, normal heart sounds and intact distal pulses.  Pulmonary/Chest: Effort normal and breath sounds normal.  Abdominal: Soft. Bowel sounds are normal. There is no tenderness.  Skin: Skin is warm and dry.  Nursing note and vitals reviewed.    Assessment &  Plan:   1. Controlled type 2 diabetes mellitus with diabetic nephropathy, without long-term current use of insulin (HCC)  - Glucose (CBG) - Hemoglobin A1c - Ambulatory referral to Ophthalmology - Basic metabolic panel  2. Essential hypertension Cont.current medications. - lisinopril (PRINIVIL,ZESTRIL) 10 MG tablet; Take 1 tablet (10 mg total) by mouth daily.  Dispense: 90 tablet; Refill: 1  3. Screening cholesterol level  - Lipid Panel       Follow-up:  Return in about 3 months (around 02/14/2018) for DM/HTN .   Alfonse Spruce FNP

## 2017-11-18 LAB — BASIC METABOLIC PANEL
BUN/Creatinine Ratio: 21 — ABNORMAL HIGH (ref 9–20)
BUN: 20 mg/dL (ref 6–24)
CHLORIDE: 101 mmol/L (ref 96–106)
CO2: 23 mmol/L (ref 20–29)
CREATININE: 0.97 mg/dL (ref 0.76–1.27)
Calcium: 10.2 mg/dL (ref 8.7–10.2)
GFR calc Af Amer: 98 mL/min/{1.73_m2} (ref >59)
GFR calc non Af Amer: 85 mL/min/{1.73_m2} (ref >59)
GLUCOSE: 147 mg/dL — AB (ref 65–99)
POTASSIUM: 4.8 mmol/L (ref 3.5–5.2)
SODIUM: 140 mmol/L (ref 134–144)

## 2017-11-18 LAB — LIPID PANEL
CHOL/HDL RATIO: 4.4 ratio (ref 0.0–5.0)
Cholesterol, Total: 164 mg/dL (ref 100–199)
HDL: 37 mg/dL — ABNORMAL LOW (ref >39)
LDL CALC: 61 mg/dL (ref 0–99)
Triglycerides: 329 mg/dL — ABNORMAL HIGH (ref 0–149)
VLDL CHOLESTEROL CAL: 66 mg/dL — AB (ref 5–40)

## 2017-11-18 LAB — HEMOGLOBIN A1C
ESTIMATED AVERAGE GLUCOSE: 131 mg/dL
Hgb A1c MFr Bld: 6.2 % — ABNORMAL HIGH (ref 4.8–5.6)

## 2017-11-22 ENCOUNTER — Ambulatory Visit: Payer: Self-pay | Admitting: Family Medicine

## 2017-11-24 ENCOUNTER — Other Ambulatory Visit: Payer: Self-pay | Admitting: Family Medicine

## 2017-11-24 DIAGNOSIS — E782 Mixed hyperlipidemia: Secondary | ICD-10-CM

## 2017-11-24 MED ORDER — ATORVASTATIN CALCIUM 20 MG PO TABS: 20.0000 mg | ORAL_TABLET | Freq: Every day | ORAL | 2 refills | Status: DC

## 2017-11-24 MED FILL — ?ATORVASTATIN 20MG TABLET: 20 | 30 days supply | Qty: 30 | Fill #0

## 2017-11-28 ENCOUNTER — Telehealth: Payer: Self-pay

## 2017-11-28 NOTE — Telephone Encounter (Signed)
CMA attempt to call patient regarding results.  No answer and left a voicemail for patient.  If patient call back please inform:  Diabetes screening shows Hgba1c of 6.2. Diabetes controlled on current medications.  Lipid levels were elevated. This can increase your risk of heart disease overtime. You will be prescribed atorvastatin. Start eating a diet low in saturated fat. Limit your intake of fried foods, red meats, and whole milk. Increase physical activity.  Recommend f/u in 3 months.

## 2017-11-28 NOTE — Telephone Encounter (Signed)
-----   Message from Alfonse Spruce, [place] sent at 11/24/2017  3:01 PM EST ----- Diabetes screening shows Hgba1c of 6.2. Diabetes controlled on current medications.  Lipid levels were elevated. This can increase your risk of heart disease overtime. You will be prescribed atorvastatin. Start eating a diet low in saturated fat. Limit your intake of fried foods, red meats, and whole milk. Increase physical activity.  Recommend f/u in 3 months.

## 2017-12-02 MED FILL — METFORMIN HCL ER 500 MG TAB: 500 | 30 days supply | Qty: 60 | Fill #1

## 2017-12-12 MED FILL — !CELEBREX 200 MG CAPSULE: 200 | 30 days supply | Qty: 60 | Fill #1

## 2017-12-12 MED FILL — LISINOPRIL 10 MG TABS: 10 | 30 days supply | Qty: 30 | Fill #3

## 2017-12-30 MED FILL — ATORVASTATIN 20 MG TABLET: 20 | 30 days supply | Qty: 30 | Fill #1

## 2018-01-16 MED FILL — LISINOPRIL 10 MG TABS: 10 | 30 days supply | Qty: 30 | Fill #4

## 2018-01-16 MED FILL — raNITIdine HCL 150 MG TABS: 150 | 30 days supply | Qty: 30 | Fill #0

## 2018-02-09 MED FILL — ATORVASTATIN 20 MG TABLET: 20 | 30 days supply | Qty: 30 | Fill #2

## 2018-02-09 MED FILL — METFORMIN HCL ER 500 MG TAB: 500 | 30 days supply | Qty: 60 | Fill #2

## 2018-02-14 ENCOUNTER — Ambulatory Visit: Payer: Self-pay | Admitting: Nurse Practitioner

## 2018-02-21 ENCOUNTER — Encounter: Payer: Self-pay | Admitting: Internal Medicine

## 2018-02-21 MED FILL — LISINOPRIL 10 MG TABS: 10 | 30 days supply | Qty: 30 | Fill #5

## 2018-03-07 ENCOUNTER — Ambulatory Visit: Payer: Self-pay | Attending: Nurse Practitioner | Admitting: Nurse Practitioner

## 2018-03-07 ENCOUNTER — Encounter: Payer: Self-pay | Admitting: Nurse Practitioner

## 2018-03-07 VITALS — BP 126/81 | HR 75 | Temp 98.3°F | Ht 73.0 in | Wt 221.0 lb

## 2018-03-07 DIAGNOSIS — I10 Essential (primary) hypertension: Secondary | ICD-10-CM

## 2018-03-07 DIAGNOSIS — M546 Pain in thoracic spine: Secondary | ICD-10-CM | POA: Insufficient documentation

## 2018-03-07 DIAGNOSIS — Z9889 Other specified postprocedural states: Secondary | ICD-10-CM | POA: Insufficient documentation

## 2018-03-07 DIAGNOSIS — Z801 Family history of malignant neoplasm of trachea, bronchus and lung: Secondary | ICD-10-CM | POA: Insufficient documentation

## 2018-03-07 DIAGNOSIS — K219 Gastro-esophageal reflux disease without esophagitis: Secondary | ICD-10-CM

## 2018-03-07 DIAGNOSIS — F17210 Nicotine dependence, cigarettes, uncomplicated: Secondary | ICD-10-CM | POA: Insufficient documentation

## 2018-03-07 DIAGNOSIS — F17200 Nicotine dependence, unspecified, uncomplicated: Secondary | ICD-10-CM

## 2018-03-07 DIAGNOSIS — E1121 Type 2 diabetes mellitus with diabetic nephropathy: Secondary | ICD-10-CM

## 2018-03-07 DIAGNOSIS — E781 Pure hyperglyceridemia: Secondary | ICD-10-CM

## 2018-03-07 DIAGNOSIS — Z7984 Long term (current) use of oral hypoglycemic drugs: Secondary | ICD-10-CM | POA: Insufficient documentation

## 2018-03-07 DIAGNOSIS — Z833 Family history of diabetes mellitus: Secondary | ICD-10-CM | POA: Insufficient documentation

## 2018-03-07 DIAGNOSIS — G8929 Other chronic pain: Secondary | ICD-10-CM

## 2018-03-07 DIAGNOSIS — Z79899 Other long term (current) drug therapy: Secondary | ICD-10-CM | POA: Insufficient documentation

## 2018-03-07 DIAGNOSIS — M549 Dorsalgia, unspecified: Secondary | ICD-10-CM | POA: Insufficient documentation

## 2018-03-07 LAB — GLUCOSE, POCT (MANUAL RESULT ENTRY): POC GLUCOSE: 116 mg/dL — AB (ref 70–99)

## 2018-03-07 MED ORDER — MELOXICAM 15 MG PO TABS: 15.0000 mg | ORAL_TABLET | Freq: Every day | ORAL | 1 refills | Status: DC

## 2018-03-07 MED ORDER — RANITIDINE HCL 75 MG PO TABS
75.0000 mg | ORAL_TABLET | Freq: Every day | ORAL | 1 refills | Status: DC
Start: 2018-03-07 — End: 2018-08-15

## 2018-03-07 MED FILL — MELOXICAM 15 MG TABLET: 15 | 30 days supply | Qty: 30 | Fill #0

## 2018-03-07 NOTE — Progress Notes (Signed)
Assessment & Plan:  Kyle Campos was seen today for establish care and back pain.  Diagnoses and all orders for this visit:  Controlled type 2 diabetes mellitus with diabetic nephropathy, without long-term current use of insulin (HCC) -     Glucose (CBG) Continue blood sugar control as discussed in office today, low carbohydrate diet, and regular physical exercise as tolerated, 150 minutes per week (30 min each day, 5 days per week, or 50 min 3 days per week). Keep blood sugar logs with fasting goal of 80-130 mg/dl, post prandial less than 180.  For Hypoglycemia: BS <60 and Hyperglycemia BS >400; contact the clinic ASAP. Annual eye exams and foot exams are recommended.   Tobacco dependence Kyle Campos was counseled on the dangers of tobacco use, and was advised to quit. Reviewed strategies to maximize success, including removing cigarettes and smoking materials from environment, stress management and support of family/friends as well as pharmacological alternatives including: Wellbutrin, Chantix, Nicotine patch, Nicotine gum or lozenges. Smoking cessation support: smoking cessation hotline: 1-800-QUIT-NOW.  Smoking cessation classes are also available through [facility]. Call [phone] or visit our website at [email or web address]   A total of 3 minutes was spent on counseling for smoking cessation and Kyle Campos is not ready to quit.   High blood triglycerides INSTRUCTIONS: Work on a low fat, heart healthy diet and participate in regular aerobic exercise program by working out at least 150 minutes per week. No fried foods. No junk foods, sodas, sugary drinks, unhealthy snacking, alcohol or smoking.     Essential hypertension Continue all antihypertensives as prescribed.  Remember to bring in your blood pressure log with you for your follow up appointment.  DASH/Mediterranean Diets are healthier choices for HTN.    Gastroesophageal reflux disease, esophagitis presence  not specified -     ranitidine (RANITIDINE ACID REDUCER) 75 MG tablet; Take 1 tablet (75 mg total) by mouth daily. INSTRUCTIONS: Avoid GERD Triggers: acidic, spicy or fried foods, caffeine, coffee, sodas,  alcohol and chocolate.   Chronic midline thoracic back pain -     meloxicam (MOBIC) 15 MG tablet; Take 1 tablet (15 mg total) by mouth daily. Work on losing weight to help reduce back pain. May alternate with heat and ice application for pain relief. May also alternate with acetaminophen and flexeril as prescribed for back pain. Other alternatives include massage, acupuncture and water aerobics.  You must stay active and avoid a sedentary lifestyle.     Patient has been counseled on age-appropriate routine health concerns for screening and prevention. These are reviewed and up-to-date. Referrals have been placed accordingly. Immunizations are up-to-date or declined.    Subjective:   Chief Complaint  Patient presents with  . Establish Care    Pt. is here to establish care for diabetes and hypetension.Pt. would like to talk about back pain.   . Back Pain   HPI Kyle Campos 62 y.o. male presents to office today to establish care and for follow up to DM.   Diabetes Mellitus Type 2 Disease course has been stable There are no hypoglycemic symptoms. There are no hypoglycemic complications. Symptoms are stable. There are not diabetic complications. Risk factors for coronary artery disease include family history, dyslipidemia, diabetes mellitus, gender, hypertension. Current diabetic treatment includes Metformin 500mg  BID. Patient is compliant with treatment all of the time and monitors blood glucose at home intermittently. Weight is  gradually decreasing. Patient follows a generally healthy diet. Meal planning includes avoidance of concentrated  sweets. Patient has not seen a dietician. Patient is compliant with exercise.   An ACE inhibitor/angiotensin II receptor blocker is being taken. He does  not see a podiatrist. Eye exam is not current. He does not have insurance. He was given resources for vision exams. Eating salads for lunch, eggs and sometimes bacon and a healthy dinner. He exercises by walking over a mile a day at least 6 days a week. He does not eat bread.  Lab Results  Component Value Date   HGBA1C 6.2 (H) 11/17/2017    Tobacco Dependence He is not ready to fully commit to quitting. His goal is to decrease the amount of cigarettes he smokes daily.  Currently he smokes close to a pack of cigarettes per day; in the past he has been up to 1.5-2ppd.   Essential Hypertension Chronic and well controlled. Endorses medication compliance taking lisinopril daily. Denies chest pain, shortness of breath, palpitations, lightheadedness, dizziness, headaches or BLE edema.  BP Readings from Last 3 Encounters:  03/07/18 126/81  11/17/17 113/70  08/22/17 121/85   Chronic Thoracic Back Pain Ongoing for several years. Associated symptoms include stabbing neck pain. Aggravating factors to back pain: prolonged sitting or standing. Work history includes physical labor. He denies any radiating pain or sciatica. Previous xray of the cervical spine 09-2016 showed mild degenerative changes at C5-C6 level with no soft tissue swelling. Will switch celebrex to meloxicam today. He reports significant relief of symptoms with celebrex and flexeril in the past.    Review of Systems  Constitutional: Negative for fever, malaise/fatigue and weight loss.  HENT: Negative.  Negative for nosebleeds.   Eyes: Negative.  Negative for blurred vision, double vision and photophobia.  Respiratory: Negative.  Negative for cough and shortness of breath.   Cardiovascular: Negative.  Negative for chest pain, palpitations and leg swelling.  Gastrointestinal: Positive for heartburn. Negative for nausea and vomiting.  Musculoskeletal: Positive for back pain, myalgias and neck pain.  Neurological: Negative.  Negative for  dizziness, focal weakness, seizures and headaches.  Psychiatric/Behavioral: Negative.  Negative for suicidal ideas.    Past Medical History:  Diagnosis Date  . Colon polyps 03/14/2015   Tubular adenoma x 6  . Diabetes mellitus ([place])   . Diverticulosis   . Hepatic steatosis   . Lung nodules     Past Surgical History:  Procedure Laterality Date  . APPENDECTOMY    . colonoscopy      Family History  Problem Relation Age of Onset  . Diabetes Mother   . Cancer Father        lung cancer   . Stroke Maternal Grandfather   . Colon cancer Neg Hx     Social History Reviewed with no changes to be made today.   Outpatient Medications Prior to Visit  Medication Sig Dispense Refill  . atorvastatin (LIPITOR) 20 MG tablet Take 1 tablet (20 mg total) by mouth daily. 30 tablet 2  . lisinopril (PRINIVIL,ZESTRIL) 10 MG tablet Take 1 tablet (10 mg total) by mouth daily. 90 tablet 1  . metFORMIN (GLUCOPHAGE XR) 500 MG 24 hr tablet Take 1 tablet (500 mg total) 2 (two) times daily with a meal by mouth. 60 tablet 5  . celecoxib (CELEBREX) 200 MG capsule Take 1 capsule (200 mg total) 2 (two) times daily by mouth. 60 capsule 1  . fluticasone (FLONASE) 50 MCG/ACT nasal spray Place 2 sprays into both nostrils daily. (Patient not taking: Reported on 11/17/2017) 16 g 6  . glucose  blood test strip Use as instructed (Patient not taking: Reported on 11/17/2017) 100 each 12  . ibuprofen (ADVIL,MOTRIN) 600 MG tablet Take 1 tablet (600 mg total) by mouth every 8 (eight) hours as needed. (Patient not taking: Reported on 03/07/2018) 30 tablet 0  . nicotine (NICODERM CQ - DOSED IN MG/24 HOURS) 21 mg/24hr patch Place 1 patch (21 mg total) onto the skin daily. (Patient not taking: Reported on 08/22/2017) 28 patch 1   Facility-Administered Medications Prior to Visit  Medication Dose Route Frequency Provider Last Rate Last Dose  . ipratropium-albuterol (DUONEB) 0.5-2.5 (3) MG/3ML nebulizer solution 3 mL  3 mL Nebulization  Q20 Min PRN Hairston, Mandesia R, FNP        No Known Allergies     Objective:    BP 126/81 (BP Location: Left Arm, Patient Position: Sitting, Cuff Size: Normal)   Pulse 75   Temp 98.3 F (36.8 C) (Oral)   Ht 6\' 1"  (1.854 m)   Wt 221 lb (100.2 kg)   SpO2 95%   BMI 29.16 kg/m  Wt Readings from Last 3 Encounters:  03/07/18 221 lb (100.2 kg)  11/17/17 228 lb 6.4 oz (103.6 kg)  08/22/17 232 lb (105.2 kg)    Physical Exam  Constitutional: He is oriented to person, place, and time. He appears well-developed and well-nourished. He is cooperative.  HENT:  Head: Normocephalic and atraumatic.  Eyes: EOM are normal.  Neck: Muscular tenderness present. No spinous process tenderness present. No neck rigidity. Decreased range of motion present. No Brudzinski's sign and no Kernig's sign noted.  Cardiovascular: Normal rate, regular rhythm and normal heart sounds. Exam reveals no gallop and no friction rub.  No murmur heard. Pulmonary/Chest: Effort normal and breath sounds normal. No tachypnea. No respiratory distress. He has no decreased breath sounds. He has no wheezes. He has no rhonchi. He has no rales. He exhibits no tenderness.  Abdominal: Soft. Bowel sounds are normal.  Musculoskeletal: He exhibits no edema.       Cervical back: He exhibits decreased range of motion, tenderness and pain.       Thoracic back: He exhibits tenderness and pain.       Back:  Neurological: He is alert and oriented to person, place, and time. He displays no atrophy. No cranial nerve deficit or sensory deficit. Coordination and gait normal.  Skin: Skin is warm and dry.  Psychiatric: He has a normal mood and affect. His behavior is normal. Judgment and thought content normal.  Nursing note and vitals reviewed.      Patient has been counseled extensively about nutrition and exercise as well as the importance of adherence with medications and regular follow-up. The patient was given clear instructions to go  to ER or return to medical center if symptoms don't improve, worsen or new problems develop. The patient verbalized understanding.   Follow-up: Return in about 3 months (around 06/07/2018) for DM.   Gildardo Pounds, FNP-BC [facility] [place], [place]   03/13/2018, 10:51 PM

## 2018-03-07 NOTE — Patient Instructions (Addendum)
Diabetes Mellitus and Nutrition When you have diabetes (diabetes mellitus), it is very important to have healthy eating habits because your blood sugar (glucose) levels are greatly affected by what you eat and drink. Eating healthy foods in the appropriate amounts, at about the same times every day, can help you:  Control your blood glucose.  Lower your risk of heart disease.  Improve your blood pressure.  Reach or maintain a healthy weight.  Every person with diabetes is different, and each person has different needs for a meal plan. Your health care provider may recommend that you work with a diet and nutrition specialist (dietitian) to make a meal plan that is best for you. Your meal plan may vary depending on factors such as:  The calories you need.  The medicines you take.  Your weight.  Your blood glucose, blood pressure, and cholesterol levels.  Your activity level.  Other health conditions you have, such as heart or kidney disease.  How do carbohydrates affect me? Carbohydrates affect your blood glucose level more than any other type of food. Eating carbohydrates naturally increases the amount of glucose in your blood. Carbohydrate counting is a method for keeping track of how many carbohydrates you eat. Counting carbohydrates is important to keep your blood glucose at a healthy level, especially if you use insulin or take certain oral diabetes medicines. It is important to know how many carbohydrates you can safely have in each meal. This is different for every person. Your dietitian can help you calculate how many carbohydrates you should have at each meal and for snack. Foods that contain carbohydrates include:  Bread, cereal, rice, pasta, and crackers.  Potatoes and corn.  Peas, beans, and lentils.  Milk and yogurt.  Fruit and juice.  Desserts, such as cakes, cookies, ice cream, and candy.  How does alcohol affect me? Alcohol can cause a sudden decrease in blood  glucose (hypoglycemia), especially if you use insulin or take certain oral diabetes medicines. Hypoglycemia can be a life-threatening condition. Symptoms of hypoglycemia (sleepiness, dizziness, and confusion) are similar to symptoms of having too much alcohol. If your health care provider says that alcohol is safe for you, follow these guidelines:  Limit alcohol intake to no more than 1 drink per day for nonpregnant women and 2 drinks per day for men. One drink equals 12 oz of beer, 5 oz of wine, or 1 oz of hard liquor.  Do not drink on an empty stomach.  Keep yourself hydrated with water, diet soda, or unsweetened iced tea.  Keep in mind that regular soda, juice, and other mixers may contain a lot of sugar and must be counted as carbohydrates.  What are tips for following this plan? Reading food labels  Start by checking the serving size on the label. The amount of calories, carbohydrates, fats, and other nutrients listed on the label are based on one serving of the food. Many foods contain more than one serving per package.  Check the total grams (g) of carbohydrates in one serving. You can calculate the number of servings of carbohydrates in one serving by dividing the total carbohydrates by 15. For example, if a food has 30 g of total carbohydrates, it would be equal to 2 servings of carbohydrates.  Check the number of grams (g) of saturated and trans fats in one serving. Choose foods that have low or no amount of these fats.  Check the number of milligrams (mg) of sodium in one serving. Most people   should limit total sodium intake to less than 2,300 mg per day.  Always check the nutrition information of foods labeled as "low-fat" or "nonfat". These foods may be higher in added sugar or refined carbohydrates and should be avoided.  Talk to your dietitian to identify your daily goals for nutrients listed on the label. Shopping  Avoid buying canned, premade, or processed foods. These  foods tend to be high in fat, sodium, and added sugar.  Shop around the outside edge of the grocery store. This includes fresh fruits and vegetables, bulk grains, fresh meats, and fresh dairy. Cooking  Use low-heat cooking methods, such as baking, instead of high-heat cooking methods like deep frying.  Cook using healthy oils, such as olive, canola, or sunflower oil.  Avoid cooking with butter, cream, or high-fat meats. Meal planning  Eat meals and snacks regularly, preferably at the same times every day. Avoid going long periods of time without eating.  Eat foods high in fiber, such as fresh fruits, vegetables, beans, and whole grains. Talk to your dietitian about how many servings of carbohydrates you can eat at each meal.  Eat 4-6 ounces of lean protein each day, such as lean meat, chicken, fish, eggs, or tofu. 1 ounce is equal to 1 ounce of meat, chicken, or fish, 1 egg, or 1/4 cup of tofu.  Eat some foods each day that contain healthy fats, such as avocado, nuts, seeds, and fish. Lifestyle   Check your blood glucose regularly.  Exercise at least 30 minutes 5 or more days each week, or as told by your health care provider.  Take medicines as told by your health care provider.  Do not use any products that contain nicotine or tobacco, such as cigarettes and e-cigarettes. If you need help quitting, ask your health care provider.  Work with a Social worker or diabetes educator to identify strategies to manage stress and any emotional and social challenges. What are some questions to ask my health care provider?  Do I need to meet with a diabetes educator?  Do I need to meet with a dietitian?  What number can I call if I have questions?  When are the best times to check my blood glucose? Where to find more information:  American Diabetes Association: diabetes.org/food-and-fitness/food  Academy of Nutrition and Dietetics:  PokerClues.dk  [employer] of Diabetes and Digestive and Kidney Diseases (NIH): ContactWire.be Summary  A healthy meal plan will help you control your blood glucose and maintain a healthy lifestyle.  Working with a diet and nutrition specialist (dietitian) can help you make a meal plan that is best for you.  Keep in mind that carbohydrates and alcohol have immediate effects on your blood glucose levels. It is important to count carbohydrates and to use alcohol carefully. This information is not intended to replace advice given to you by your health care provider. Make sure you discuss any questions you have with your health care provider. Document Released: 06/17/2005 Document Revised: 10/25/2016 Document Reviewed: 10/25/2016 Elsevier Interactive Patient Education  2018 [place] Risks of Smoking Smoking cigarettes is very bad for your health. Tobacco smoke has over 200 known poisons in it. It contains the poisonous gases nitrogen oxide and carbon monoxide. There are over 60 chemicals in tobacco smoke that cause cancer. Smoking is difficult to quit because a chemical in tobacco, called nicotine, causes addiction or dependence. When you smoke and inhale, nicotine is absorbed rapidly into the bloodstream through your lungs. Both inhaled and  non-inhaled nicotine may be addictive. What are the risks of cigarette smoke? Cigarette smokers have an increased risk of many serious medical problems, including:  Lung cancer.  Lung disease, such as pneumonia, bronchitis, and emphysema.  Chest pain (angina) and heart attack because the heart is not getting enough oxygen.  Heart disease and peripheral blood vessel disease.  High blood pressure (hypertension).  Stroke.  Oral cancer, including cancer of the lip, mouth, or voice box.  Bladder  cancer.  Pancreatic cancer.  Cervical cancer.  Pregnancy complications, including premature birth.  Stillbirths and smaller newborn babies, birth defects, and genetic damage to sperm.  Early menopause.  Lower estrogen level for women.  Infertility.  Facial wrinkles.  Blindness.  Increased risk of broken bones (fractures).  Senile dementia.  Stomach ulcers and internal bleeding.  Delayed wound healing and increased risk of complications during surgery.  Even smoking lightly shortens your life expectancy by several years.  Because of secondhand smoke exposure, children of smokers have an increased risk of the following:  Sudden infant death syndrome (SIDS).  Respiratory infections.  Lung cancer.  Heart disease.  Ear infections.  What are the benefits of quitting? There are many health benefits of quitting smoking. Here are some of them:  Within days of quitting smoking, your risk of having a heart attack decreases, your blood flow improves, and your lung capacity improves. Blood pressure, pulse rate, and breathing patterns start returning to normal soon after quitting.  Within months, your lungs may clear up completely.  Quitting for 10 years reduces your risk of developing lung cancer and heart disease to almost that of a nonsmoker.  People who quit may see an improvement in their overall quality of life.  How do I quit smoking? Smoking is an addiction with both physical and psychological effects, and longtime habits can be hard to change. Your health care provider can recommend:  Programs and community resources, which may include group support, education, or talk therapy.  Prescription medicines to help reduce cravings.  Nicotine replacement products, such as patches, gum, and nasal sprays. Use these products only as directed. Do not replace cigarette smoking with electronic cigarettes, which are commonly called e-cigarettes. The safety of e-cigarettes is  not known, and some may contain harmful chemicals.  A combination of two or more of these methods.  Where to find more information:  American Lung Association: www.lung.org  American Cancer Society: www.cancer.org Summary  Smoking cigarettes is very bad for your health. Cigarette smokers have an increased risk of many serious medical problems, including several cancers, heart disease, and stroke.  Smoking is an addiction with both physical and psychological effects, and longtime habits can be hard to change.  By stopping right away, you can greatly reduce the risk of medical problems for you and your family.  To help you quit smoking, your health care provider can recommend programs, community resources, prescription medicines, and nicotine replacement products such as patches, gum, and nasal sprays. This information is not intended to replace advice given to you by your health care provider. Make sure you discuss any questions you have with your health care provider. Document Released: 10/28/2004 Document Revised: 09/24/2016 Document Reviewed: 09/24/2016 Elsevier Interactive Patient Education  2017 [employer].  Steps to Quit Smoking Smoking tobacco can be bad for your health. It can also affect almost every organ in your body. Smoking puts you and people around you at risk for many serious long-lasting (chronic) diseases. Quitting smoking is hard, but  it is one of the best things that you can do for your health. It is never too late to quit. What are the benefits of quitting smoking? When you quit smoking, you lower your risk for getting serious diseases and conditions. They can include:  Lung cancer or lung disease.  Heart disease.  Stroke.  Heart attack.  Not being able to have children (infertility).  Weak bones (osteoporosis) and broken bones (fractures).  If you have coughing, wheezing, and shortness of breath, those symptoms may get better when you quit. You may also  get sick less often. If you are pregnant, quitting smoking can help to lower your chances of having a baby of low birth weight. What can I do to help me quit smoking? Talk with your doctor about what can help you quit smoking. Some things you can do (strategies) include:  Quitting smoking totally, instead of slowly cutting back how much you smoke over a period of time.  Going to in-person counseling. You are more likely to quit if you go to many counseling sessions.  Using resources and support systems, such as: ? Database administrator with a Social worker. ? Phone quitlines. ? Careers information officer. ? Support groups or group counseling. ? Text messaging programs. ? Mobile phone apps or applications.  Taking medicines. Some of these medicines may have nicotine in them. If you are pregnant or breastfeeding, do not take any medicines to quit smoking unless your doctor says it is okay. Talk with your doctor about counseling or other things that can help you.  Talk with your doctor about using more than one strategy at the same time, such as taking medicines while you are also going to in-person counseling. This can help make quitting easier. What things can I do to make it easier to quit? Quitting smoking might feel very hard at first, but there is a lot that you can do to make it easier. Take these steps:  Talk to your family and friends. Ask them to support and encourage you.  Call phone quitlines, reach out to support groups, or work with a Social worker.  Ask people who smoke to not smoke around you.  Avoid places that make you want (trigger) to smoke, such as: ? Bars. ? Parties. ? Smoke-break areas at work.  Spend time with people who do not smoke.  Lower the stress in your life. Stress can make you want to smoke. Try these things to help your stress: ? Getting regular exercise. ? Deep-breathing exercises. ? Yoga. ? Meditating. ? Doing a body scan. To do this, close your eyes, focus on  one area of your body at a time from head to toe, and notice which parts of your body are tense. Try to relax the muscles in those areas.  Download or buy apps on your mobile phone or tablet that can help you stick to your quit plan. There are many free apps, such as QuitGuide from the [employer] Office manager for Disease Control and Prevention). You can find more support from smokefree.gov and other websites.  This information is not intended to replace advice given to you by your health care provider. Make sure you discuss any questions you have with your health care provider. Document Released: 07/17/2009 Document Revised: 05/18/2016 Document Reviewed: 02/04/2015 Elsevier Interactive Patient Education  2018 [employer].

## 2018-04-04 MED FILL — LISINOPRIL 10 MG TABS: 10 | 30 days supply | Qty: 30 | Fill #6

## 2018-04-19 ENCOUNTER — Ambulatory Visit: Payer: Self-pay | Attending: Nurse Practitioner

## 2018-04-25 MED FILL — metFORMIN HCL ER 500 MG TB2: 500 | 30 days supply | Qty: 60 | Fill #3

## 2018-05-08 MED FILL — LISINOPRIL 10 MG TABS: 10 | 30 days supply | Qty: 30 | Fill #7

## 2018-06-07 ENCOUNTER — Ambulatory Visit: Payer: Self-pay | Admitting: Nurse Practitioner

## 2018-06-12 MED FILL — LISINOPRIL 10 MG TABS: 10 | 30 days supply | Qty: 30 | Fill #8

## 2018-06-26 ENCOUNTER — Ambulatory Visit: Payer: Self-pay | Attending: Nurse Practitioner | Admitting: Nurse Practitioner

## 2018-06-26 ENCOUNTER — Encounter: Payer: Self-pay | Admitting: Nurse Practitioner

## 2018-06-26 VITALS — BP 111/74 | HR 91 | Temp 98.6°F | Ht 73.0 in | Wt 221.8 lb

## 2018-06-26 DIAGNOSIS — E119 Type 2 diabetes mellitus without complications: Secondary | ICD-10-CM

## 2018-06-26 DIAGNOSIS — E1121 Type 2 diabetes mellitus with diabetic nephropathy: Secondary | ICD-10-CM

## 2018-06-26 DIAGNOSIS — Z23 Encounter for immunization: Secondary | ICD-10-CM

## 2018-06-26 DIAGNOSIS — Z1211 Encounter for screening for malignant neoplasm of colon: Secondary | ICD-10-CM

## 2018-06-26 DIAGNOSIS — Z7984 Long term (current) use of oral hypoglycemic drugs: Secondary | ICD-10-CM | POA: Insufficient documentation

## 2018-06-26 DIAGNOSIS — R238 Other skin changes: Secondary | ICD-10-CM

## 2018-06-26 DIAGNOSIS — Z79899 Other long term (current) drug therapy: Secondary | ICD-10-CM | POA: Insufficient documentation

## 2018-06-26 DIAGNOSIS — I10 Essential (primary) hypertension: Secondary | ICD-10-CM | POA: Insufficient documentation

## 2018-06-26 LAB — GLUCOSE, POCT (MANUAL RESULT ENTRY): POC Glucose: 166 mg/dl — AB (ref 70–99)

## 2018-06-26 LAB — POCT GLYCOSYLATED HEMOGLOBIN (HGB A1C): HEMOGLOBIN A1C: 5.8 % — AB (ref 4.0–5.6)

## 2018-06-26 NOTE — Progress Notes (Signed)
Assessment & Plan:  Kyle Campos was seen today for follow-up.  Diagnoses and all orders for this visit:  Controlled type 2 diabetes mellitus without complication, without long-term current use of insulin (HCC) -     Glucose (CBG) -     HgB A1c -     CBC -     CMP14+EGFR -     Lipid panel Continue blood sugar control as discussed in office today, low carbohydrate diet, and regular physical exercise as tolerated, 150 minutes per week (30 min each day, 5 days per week, or 50 min 3 days per week). Keep blood sugar logs with fasting goal of 90-130 mg/dl, post prandial (after you eat) less than 180.  For Hypoglycemia: BS <60 and Hyperglycemia BS >400; contact the clinic ASAP. Annual eye exams and foot exams are recommended.   Other skin changes -     Ambulatory referral to Dermatology  Colon cancer screening -     Ambulatory referral to Gastroenterology   Patient has been counseled on age-appropriate routine health concerns for screening and prevention. These are reviewed and up-to-date. Referrals have been placed accordingly. Immunizations are up-to-date or declined.    Subjective:   Chief Complaint  Patient presents with  . Follow-up    Pt. is here to follow-up on diabetes and hypertension.    HPI Kyle Campos 62 y.o. male presents to office today for follow up t  Type 2 Diabetes Mellitus A1c down from 6.2. Disease course has been improving. There are no hypoglycemic symptoms. There are no hypoglycemic complications. Symptoms are stable. There are no diabetic complications.  Current diabetic treatment includes metformin XR 500 mg BID. Patient is compliant with treatment all of the time and monitors blood glucose at home infrequently.  Weight is stable. Patient follows a generally healthy diet. Meal planning includes avoidance of concentrated sweets. Patient has not seen a dietician. Patient is not compliant with exercise.   An ACE inhibitor/angiotensin II receptor blocker is being  taken. Patient does not see a podiatrist. Eye exam is current.  Lab Results  Component Value Date   HGBA1C 5.8 (A) 06/26/2018   CHRONIC HYPERTENSION Disease Monitoring  Blood pressure range BP Readings from Last 3 Encounters:  06/26/18 111/74  03/07/18 126/81  11/17/17 113/70   Chest pain: no   Dyspnea: no   Claudication: no  Medication compliance: yes, metformin 500 mg BID  Medication Side Effects  Lightheadedness: no   Urinary frequency: no   Edema: no   Impotence: no  Preventitive Healthcare:  Exercise: no   Diet Pattern: diet: general  Salt Restriction:  no    Review of Systems  Constitutional: Negative for fever, malaise/fatigue and weight loss.  HENT: Negative.  Negative for nosebleeds.   Eyes: Negative.  Negative for blurred vision, double vision and photophobia.  Respiratory: Negative.  Negative for cough and shortness of breath.   Cardiovascular: Negative.  Negative for chest pain, palpitations and leg swelling.  Gastrointestinal: Negative.  Negative for heartburn, nausea and vomiting.  Musculoskeletal: Negative.  Negative for myalgias.  Neurological: Negative.  Negative for dizziness, focal weakness, seizures and headaches.  Psychiatric/Behavioral: Negative.  Negative for suicidal ideas.    Past Medical History:  Diagnosis Date  . Colon polyps 03/14/2015   Tubular adenoma x 6  . Diabetes mellitus ([place])   . Diverticulosis   . Hepatic steatosis   . Lung nodules     Past Surgical History:  Procedure Laterality Date  . APPENDECTOMY    .  colonoscopy      Family History  Problem Relation Age of Onset  . Diabetes Mother   . Cancer Father        lung cancer   . Stroke Maternal Grandfather   . Colon cancer Neg Hx     Social History Reviewed with no changes to be made today.   Outpatient Medications Prior to Visit  Medication Sig Dispense Refill  . lisinopril (PRINIVIL,ZESTRIL) 10 MG tablet Take 1 tablet (10 mg total) by mouth daily. 90 tablet 1  .  metFORMIN (GLUCOPHAGE XR) 500 MG 24 hr tablet Take 1 tablet (500 mg total) 2 (two) times daily with a meal by mouth. 60 tablet 5  . atorvastatin (LIPITOR) 20 MG tablet Take 1 tablet (20 mg total) by mouth daily. (Patient not taking: Reported on 06/26/2018) 30 tablet 2  . fluticasone (FLONASE) 50 MCG/ACT nasal spray Place 2 sprays into both nostrils daily. (Patient not taking: Reported on 11/17/2017) 16 g 6  . glucose blood test strip Use as instructed (Patient not taking: Reported on 11/17/2017) 100 each 12  . ibuprofen (ADVIL,MOTRIN) 600 MG tablet Take 1 tablet (600 mg total) by mouth every 8 (eight) hours as needed. (Patient not taking: Reported on 03/07/2018) 30 tablet 0  . meloxicam (MOBIC) 15 MG tablet Take 1 tablet (15 mg total) by mouth daily. (Patient not taking: Reported on 06/26/2018) 90 tablet 1  . ranitidine (RANITIDINE ACID REDUCER) 75 MG tablet Take 1 tablet (75 mg total) by mouth daily. (Patient not taking: Reported on 06/26/2018) 90 tablet 1  . nicotine (NICODERM CQ - DOSED IN MG/24 HOURS) 21 mg/24hr patch Place 1 patch (21 mg total) onto the skin daily. (Patient not taking: Reported on 08/22/2017) 28 patch 1   Facility-Administered Medications Prior to Visit  Medication Dose Route Frequency Provider Last Rate Last Dose  . ipratropium-albuterol (DUONEB) 0.5-2.5 (3) MG/3ML nebulizer solution 3 mL  3 mL Nebulization Q20 Min PRN Hairston, Mandesia R, FNP        No Known Allergies     Objective:    BP 111/74 (BP Location: Left Arm, Patient Position: Sitting, Cuff Size: Large)   Pulse 91   Temp 98.6 F (37 C) (Oral)   Ht 6' 1" (1.854 m)   Wt 221 lb 12.8 oz (100.6 kg)   SpO2 93%   BMI 29.26 kg/m  Wt Readings from Last 3 Encounters:  06/26/18 221 lb 12.8 oz (100.6 kg)  03/07/18 221 lb (100.2 kg)  11/17/17 228 lb 6.4 oz (103.6 kg)    Physical Exam  Constitutional: He is oriented to person, place, and time. He appears well-developed and well-nourished. He is cooperative.  HENT:    Head: Normocephalic and atraumatic.  Eyes: EOM are normal.    Neck: Normal range of motion.  Cardiovascular: Normal rate, regular rhythm and normal heart sounds. Exam reveals no gallop and no friction rub.  No murmur heard. Pulmonary/Chest: Effort normal and breath sounds normal. No tachypnea. No respiratory distress. He has no decreased breath sounds. He has no wheezes. He has no rhonchi. He has no rales. He exhibits no tenderness.  Abdominal: Bowel sounds are normal.  Musculoskeletal: Normal range of motion. He exhibits no edema.  Neurological: He is alert and oriented to person, place, and time. Coordination normal.  Skin: Skin is warm and dry.  Psychiatric: He has a normal mood and affect. His behavior is normal. Judgment and thought content normal.  Nursing note and vitals reviewed.  Patient has been counseled extensively about nutrition and exercise as well as the importance of adherence with medications and regular follow-up. The patient was given clear instructions to go to ER or return to medical center if symptoms don't improve, worsen or new problems develop. The patient verbalized understanding.   Follow-up: Return in about 6 months (around 12/25/2018).   Gildardo Pounds, FNP-BC [facility] and [place], [place]   06/26/2018, 2:49 PM

## 2018-06-27 LAB — CMP14+EGFR
A/G RATIO: 1.8 (ref 1.2–2.2)
ALK PHOS: 52 IU/L (ref 39–117)
ALT: 23 IU/L (ref 0–44)
AST: 17 IU/L (ref 0–40)
Albumin: 4.6 g/dL (ref 3.5–5.5)
BILIRUBIN TOTAL: 0.4 mg/dL (ref 0.0–1.2)
BUN / CREAT RATIO: 22 — AB (ref 9–20)
BUN: 18 mg/dL (ref 6–24)
CO2: 25 mmol/L (ref 20–29)
Calcium: 9.5 mg/dL (ref 8.7–10.2)
Chloride: 101 mmol/L (ref 96–106)
Creatinine, Ser: 0.81 mg/dL (ref 0.76–1.27)
GFR calc non Af Amer: 97 mL/min/{1.73_m2} (ref >59)
GFR, EST AFRICAN AMERICAN: 112 mL/min/{1.73_m2} (ref >59)
GLUCOSE: 130 mg/dL — AB (ref 65–99)
Globulin, Total: 2.6 g/dL (ref 1.5–4.5)
POTASSIUM: 4.2 mmol/L (ref 3.5–5.2)
SODIUM: 141 mmol/L (ref 134–144)
Total Protein: 7.2 g/dL (ref 6.0–8.5)

## 2018-06-27 LAB — CBC
Hematocrit: 42.4 % (ref 37.5–51.0)
Hemoglobin: 14.5 g/dL (ref 13.0–17.7)
MCH: 31.6 pg (ref 26.6–33.0)
MCHC: 34.2 g/dL (ref 31.5–35.7)
MCV: 92 fL (ref 79–97)
PLATELETS: 246 10*3/uL (ref 150–450)
RBC: 4.59 x10E6/uL (ref 4.14–5.80)
RDW: 14.1 % (ref 12.3–15.4)
WBC: 6.7 10*3/uL (ref 3.4–10.8)

## 2018-06-27 LAB — LIPID PANEL
Chol/HDL Ratio: 4.2 ratio (ref 0.0–5.0)
Cholesterol, Total: 167 mg/dL (ref 100–199)
HDL: 40 mg/dL (ref >39)
LDL CALC: 69 mg/dL (ref 0–99)
TRIGLYCERIDES: 288 mg/dL — AB (ref 0–149)
VLDL Cholesterol Cal: 58 mg/dL — ABNORMAL HIGH (ref 5–40)

## 2018-06-28 MED FILL — metFORMIN HCL ER 500 MG TB2: 500 | 30 days supply | Qty: 60 | Fill #4

## 2018-07-02 ENCOUNTER — Other Ambulatory Visit: Payer: Self-pay | Admitting: Nurse Practitioner

## 2018-07-02 DIAGNOSIS — E782 Mixed hyperlipidemia: Secondary | ICD-10-CM

## 2018-07-02 MED ORDER — ATORVASTATIN CALCIUM 40 MG PO TABS: 40.0000 mg | ORAL_TABLET | Freq: Every day | ORAL | 1 refills | Status: DC

## 2018-07-03 MED FILL — ?ATORVASTATIN 40MG TABLET: 40 | 30 days supply | Qty: 30 | Fill #0

## 2018-07-19 ENCOUNTER — Encounter: Payer: Self-pay | Admitting: Internal Medicine

## 2018-07-25 MED FILL — LISINOPRIL 10 MG TABS: 10 | 30 days supply | Qty: 30 | Fill #9

## 2018-08-14 MED FILL — ?ATORVASTATIN 40MG TABLET: 40 | 30 days supply | Qty: 30 | Fill #1

## 2018-08-15 ENCOUNTER — Ambulatory Visit (AMBULATORY_SURGERY_CENTER): Payer: Self-pay | Admitting: *Deleted

## 2018-08-15 VITALS — Ht 73.0 in | Wt 225.0 lb

## 2018-08-15 DIAGNOSIS — Z86010 Personal history of colonic polyps: Secondary | ICD-10-CM

## 2018-08-15 MED ORDER — PEG-KCL-NACL-NASULF-NA ASC-C 140 G PO SOLR: 1.0000 | Freq: Once | ORAL | 0 refills | Status: AC

## 2018-08-15 NOTE — Progress Notes (Signed)
Patient denies any allergies to eggs or soy. Patient denies any problems with anesthesia/sedation. Patient denies any oxygen use at home. Patient denies taking any diet/weight loss medications or blood thinners. EMMI education offered, pt declined. Plenvu sample given to pt.

## 2018-08-28 ENCOUNTER — Telehealth: Payer: Self-pay | Admitting: Internal Medicine

## 2018-08-28 NOTE — Telephone Encounter (Signed)
Ok, no charge  

## 2018-08-29 ENCOUNTER — Encounter: Payer: Self-pay | Admitting: Internal Medicine

## 2018-09-04 MED FILL — LISINOPRIL 10 MG TABS: 10 | 30 days supply | Qty: 30 | Fill #0

## 2018-09-06 ENCOUNTER — Other Ambulatory Visit: Payer: Self-pay

## 2018-09-06 DIAGNOSIS — E1121 Type 2 diabetes mellitus with diabetic nephropathy: Secondary | ICD-10-CM

## 2018-09-06 MED ORDER — METFORMIN HCL ER 500 MG PO TB24: 500.0000 mg | ORAL_TABLET | Freq: Two times a day (BID) | ORAL | 2 refills | Status: DC

## 2018-09-06 MED FILL — METFORMIN HCL ER 500 MG TAB: 500 | 30 days supply | Qty: 60 | Fill #0

## 2018-09-15 ENCOUNTER — Ambulatory Visit: Payer: Self-pay | Attending: Family Medicine

## 2018-09-21 MED FILL — ?ATORVASTATIN 40MG TABLET: 40 | 30 days supply | Qty: 30 | Fill #2

## 2018-10-06 MED FILL — LISINOPRIL 10 MG TABS: 10 | 30 days supply | Qty: 30 | Fill #1

## 2018-10-10 ENCOUNTER — Ambulatory Visit (AMBULATORY_SURGERY_CENTER): Payer: Self-pay | Admitting: Internal Medicine

## 2018-10-10 ENCOUNTER — Encounter: Payer: Self-pay | Admitting: Internal Medicine

## 2018-10-10 VITALS — BP 120/64 | HR 77 | Resp 22 | Ht 73.0 in | Wt 221.0 lb

## 2018-10-10 DIAGNOSIS — D123 Benign neoplasm of transverse colon: Secondary | ICD-10-CM

## 2018-10-10 DIAGNOSIS — Z86010 Personal history of colonic polyps: Secondary | ICD-10-CM

## 2018-10-10 MED ORDER — SODIUM CHLORIDE 0.9 % IV SOLN: 500.0000 mL | Freq: Once | INTRAVENOUS | Status: DC

## 2018-10-10 NOTE — Progress Notes (Signed)
Spontaneous respirations throughout. VSS. Resting comfortably. To PACU on room air. Report to  RN. 

## 2018-10-10 NOTE — Op Note (Signed)
[facility] Patient Name: Kyle Campos Procedure Date: 10/10/2018 8:56 AM MRN: [id number] Endoscopist: Jerene Bears , MD Age: 63 Referring MD:  Date of Birth: 01-20-1956 Gender: Male Account #: [id number] Procedure:                Colonoscopy Indications:              Surveillance: Personal history of adenomatous                            polyps on last colonoscopy 3 years ago Medicines:                Monitored Anesthesia Care Procedure:                Pre-Anesthesia Assessment:                           - Prior to the procedure, a History and Physical                            was performed, and patient medications and                            allergies were reviewed. The patient's tolerance of                            previous anesthesia was also reviewed. The risks                            and benefits of the procedure and the sedation                            options and risks were discussed with the patient.                            All questions were answered, and informed consent                            was obtained. Prior Anticoagulants: The patient has                            taken no previous anticoagulant or antiplatelet                            agents. ASA Grade Assessment: II - A patient with                            mild systemic disease. After reviewing the risks                            and benefits, the patient was deemed in                            satisfactory condition to undergo the procedure.  After obtaining informed consent, the colonoscope                            was passed under direct vision. Throughout the                            procedure, the patient's blood pressure, pulse, and                            oxygen saturations were monitored continuously. The                            Colonoscope was introduced through the anus and                            advanced to the terminal ileum.  The colonoscopy was                            performed without difficulty. The patient tolerated                            the procedure well. The quality of the bowel                            preparation was good. The terminal ileum, ileocecal                            valve, appendiceal orifice, and rectum were                            photographed. Scope In: 9:04:31 AM Scope Out: 9:18:42 AM Scope Withdrawal Time: 0 hours 11 minutes 21 seconds  Total Procedure Duration: 0 hours 14 minutes 11 seconds  Findings:                 The digital rectal exam was normal.                           The terminal ileum appeared normal.                           Two sessile polyps were found in the transverse                            colon. The polyps were 2 to 3 mm in size. These                            polyps were removed with a cold snare. Resection                            and retrieval were complete.                           Multiple small and large-mouthed diverticula were  found in the sigmoid colon and descending colon.                           Internal hemorrhoids were found during                            retroflexion. The hemorrhoids were small. Complications:            No immediate complications. Estimated Blood Loss:     Estimated blood loss was minimal. Impression:               - The examined portion of the ileum was normal.                           - Two 2 to 3 mm polyps in the transverse colon,                            removed with a cold snare. Resected and retrieved.                           - Diverticulosis in the sigmoid colon and in the                            descending colon.                           - Internal hemorrhoids. Recommendation:           - Patient has a contact number available for                            emergencies. The signs and symptoms of potential                            delayed complications were  discussed with the                            patient. Return to normal activities tomorrow.                            Written discharge instructions were provided to the                            patient.                           - Resume previous diet.                           - Continue present medications.                           - Await pathology results.                           - Repeat colonoscopy is recommended for  surveillance. The colonoscopy date will be                            determined after pathology results from today's                            exam become available for review. Jerene Bears, MD 10/10/2018 9:24:01 AM This report has been signed electronically.

## 2018-10-10 NOTE — Progress Notes (Signed)
Called to room to assist during endoscopic procedure.  Patient ID and intended procedure confirmed with present staff. Received instructions for my participation in the procedure from the performing physician.  

## 2018-10-11 ENCOUNTER — Telehealth: Payer: Self-pay

## 2018-10-11 NOTE — Telephone Encounter (Signed)
  Follow up Call-  Call back number 10/10/2018  Post procedure Call Back phone  # [phone]  Permission to leave phone message Yes  Some recent data might be hidden     Patient questions:  Do you have a fever, pain , or abdominal swelling? No. Pain Score  0 *  Have you tolerated food without any problems? Yes.    Have you been able to return to your normal activities? Yes.    Do you have any questions about your discharge instructions: Diet   No. Medications  No. Follow up visit  No.  Do you have questions or concerns about your Care? No.  Actions: * If pain score is 4 or above: No action needed, pain <4.

## 2018-10-12 ENCOUNTER — Encounter: Payer: Self-pay | Admitting: Internal Medicine

## 2018-10-27 MED FILL — METFORMIN HCL ER 500 MG TAB: 500 | 30 days supply | Qty: 60 | Fill #1

## 2018-10-27 MED FILL — ?ATORVASTATIN 40MG TABLET: 40 | 30 days supply | Qty: 30 | Fill #3

## 2018-11-08 MED FILL — LISINOPRIL 10 MG TABS: 10 | 30 days supply | Qty: 30 | Fill #2

## 2018-12-04 MED FILL — ?ATORVASTATIN 40MG TABLET: 40 | 30 days supply | Qty: 30 | Fill #4

## 2018-12-08 ENCOUNTER — Ambulatory Visit: Payer: Self-pay | Attending: Nurse Practitioner | Admitting: Nurse Practitioner

## 2018-12-08 ENCOUNTER — Encounter: Payer: Self-pay | Admitting: Nurse Practitioner

## 2018-12-08 VITALS — BP 119/79 | HR 83 | Temp 98.7°F | Wt 228.2 lb

## 2018-12-08 DIAGNOSIS — I10 Essential (primary) hypertension: Secondary | ICD-10-CM

## 2018-12-08 DIAGNOSIS — F17200 Nicotine dependence, unspecified, uncomplicated: Secondary | ICD-10-CM

## 2018-12-08 DIAGNOSIS — E782 Mixed hyperlipidemia: Secondary | ICD-10-CM

## 2018-12-08 DIAGNOSIS — E1121 Type 2 diabetes mellitus with diabetic nephropathy: Secondary | ICD-10-CM

## 2018-12-08 DIAGNOSIS — F17210 Nicotine dependence, cigarettes, uncomplicated: Secondary | ICD-10-CM

## 2018-12-08 DIAGNOSIS — G8929 Other chronic pain: Secondary | ICD-10-CM

## 2018-12-08 DIAGNOSIS — M542 Cervicalgia: Secondary | ICD-10-CM

## 2018-12-08 LAB — POCT GLYCOSYLATED HEMOGLOBIN (HGB A1C): Hemoglobin A1C: 7.1 % — AB (ref 4.0–5.6)

## 2018-12-08 LAB — GLUCOSE, POCT (MANUAL RESULT ENTRY): POC Glucose: 246 mg/dl — AB (ref 70–99)

## 2018-12-08 MED ORDER — METFORMIN HCL ER (MOD) 1000 MG PO TB24: 1000.0000 mg | ORAL_TABLET | Freq: Two times a day (BID) | ORAL | 3 refills | Status: DC

## 2018-12-08 MED ORDER — TRUEPLUS LANCETS 28G MISC: 3 refills | Status: DC

## 2018-12-08 MED ORDER — GLUCOSE BLOOD VI STRP: ORAL_STRIP | 12 refills | Status: DC

## 2018-12-08 MED ORDER — TRUE METRIX METER W/DEVICE KIT: PACK | 0 refills | Status: DC

## 2018-12-08 MED ORDER — LISINOPRIL 10 MG PO TABS: 10.0000 mg | ORAL_TABLET | Freq: Every day | ORAL | 1 refills | Status: DC

## 2018-12-08 MED ORDER — IPRATROPIUM-ALBUTEROL 0.5-2.5 (3) MG/3ML IN SOLN
3.0000 mL | Freq: Once | RESPIRATORY_TRACT | Status: AC
  Administered 2018-12-08: 3 mL via RESPIRATORY_TRACT

## 2018-12-08 MED ORDER — ALBUTEROL SULFATE HFA 108 (90 BASE) MCG/ACT IN AERS: 2.0000 | INHALATION_SPRAY | Freq: Four times a day (QID) | RESPIRATORY_TRACT | 2 refills | Status: DC | PRN

## 2018-12-08 MED ORDER — NICOTINE 7 MG/24HR TD PT24: 7.0000 mg | MEDICATED_PATCH | Freq: Every day | TRANSDERMAL | 0 refills | Status: AC

## 2018-12-08 MED ORDER — NICOTINE 21 MG/24HR TD PT24: 21.0000 mg | MEDICATED_PATCH | Freq: Every day | TRANSDERMAL | 0 refills | Status: AC

## 2018-12-08 MED ORDER — ATORVASTATIN CALCIUM 40 MG PO TABS: 40.0000 mg | ORAL_TABLET | Freq: Every day | ORAL | 1 refills | Status: DC

## 2018-12-08 MED ORDER — NICOTINE 14 MG/24HR TD PT24: 14.0000 mg | MEDICATED_PATCH | Freq: Every day | TRANSDERMAL | 0 refills | Status: AC

## 2018-12-08 MED ORDER — METFORMIN HCL 1000 MG PO TABS: 1000.0000 mg | ORAL_TABLET | Freq: Two times a day (BID) | ORAL | 3 refills | Status: DC

## 2018-12-08 MED FILL — ?METFORMIN HCL 1000 MG TAB: 1000 | 30 days supply | Qty: 60 | Fill #0

## 2018-12-08 MED FILL — !TRUE METRIX BLOOD GLUCOSE: 1 days supply | Qty: 1 | Fill #0

## 2018-12-08 MED FILL — TRUE METRIX TEST STRIP: 50 days supply | Qty: 100 | Fill #0

## 2018-12-08 MED FILL — LISINOPRIL 10 MG TABS: 10 | 30 days supply | Qty: 30 | Fill #0

## 2018-12-08 MED FILL — TRUEplus LANCETS 28G MISC: 30 days supply | Qty: 100 | Fill #0

## 2018-12-08 MED FILL — !PROVENTIL HFA 90 MCG INH: 108 (90 BAS | 25 days supply | Qty: 1 | Fill #0

## 2018-12-08 MED FILL — NICOTINE 21 MG/24HR PATCH: 21 | 28 days supply | Qty: 28 | Fill #0

## 2018-12-08 NOTE — Patient Instructions (Signed)
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
## 2018-12-09 LAB — LIPID PANEL
Chol/HDL Ratio: 3.2 ratio (ref 0.0–5.0)
Cholesterol, Total: 133 mg/dL (ref 100–199)
HDL: 42 mg/dL (ref >39)
LDL Calculated: 48 mg/dL (ref 0–99)
Triglycerides: 214 mg/dL — ABNORMAL HIGH (ref 0–149)
VLDL Cholesterol Cal: 43 mg/dL — ABNORMAL HIGH (ref 5–40)

## 2018-12-09 LAB — CMP14+EGFR
ALT: 25 IU/L (ref 0–44)
AST: 18 IU/L (ref 0–40)
Albumin/Globulin Ratio: 1.8 (ref 1.2–2.2)
Albumin: 4.6 g/dL (ref 3.8–4.9)
Alkaline Phosphatase: 62 IU/L (ref 39–117)
BUN/Creatinine Ratio: 14 (ref 10–24)
BUN: 15 mg/dL (ref 8–27)
Bilirubin Total: 0.4 mg/dL (ref 0.0–1.2)
CO2: 22 mmol/L (ref 20–29)
CREATININE: 1.05 mg/dL (ref 0.76–1.27)
Calcium: 10.3 mg/dL — ABNORMAL HIGH (ref 8.6–10.2)
Chloride: 95 mmol/L — ABNORMAL LOW (ref 96–106)
GFR calc Af Amer: 89 mL/min/{1.73_m2} (ref >59)
GFR calc non Af Amer: 77 mL/min/{1.73_m2} (ref >59)
GLUCOSE: 239 mg/dL — AB (ref 65–99)
Globulin, Total: 2.6 g/dL (ref 1.5–4.5)
Potassium: 4.4 mmol/L (ref 3.5–5.2)
Sodium: 140 mmol/L (ref 134–144)
Total Protein: 7.2 g/dL (ref 6.0–8.5)

## 2018-12-09 LAB — MICROALBUMIN / CREATININE URINE RATIO
Creatinine, Urine: 151.3 mg/dL
Microalb/Creat Ratio: 17 mg/g creat (ref 0–29)
Microalbumin, Urine: 25.8 ug/mL

## 2019-01-11 MED FILL — ?ATORVASTATIN 40MG TABLET: 40 | 30 days supply | Qty: 30 | Fill #5

## 2019-01-15 MED FILL — LISINOPRIL 10 MG TABS: 10 | 30 days supply | Qty: 30 | Fill #1

## 2019-02-20 MED FILL — ?METFORMIN HCL 1000 MG TAB: 1000 | 30 days supply | Qty: 60 | Fill #1

## 2019-02-20 MED FILL — LISINOPRIL 10 MG TABS: 10 | 30 days supply | Qty: 30 | Fill #2

## 2019-02-20 MED FILL — ?ATORVASTATIN 40MG TABLET: 40 | 30 days supply | Qty: 30 | Fill #0

## 2019-03-12 ENCOUNTER — Ambulatory Visit: Payer: Self-pay | Admitting: Nurse Practitioner

## 2019-03-13 ENCOUNTER — Encounter: Payer: Self-pay | Admitting: Nurse Practitioner

## 2019-03-13 ENCOUNTER — Other Ambulatory Visit: Payer: Self-pay

## 2019-03-13 ENCOUNTER — Ambulatory Visit: Payer: Self-pay | Attending: Nurse Practitioner | Admitting: Nurse Practitioner

## 2019-03-13 VITALS — BP 129/67 | HR 83 | Temp 98.1°F | Ht 73.0 in | Wt 223.0 lb

## 2019-03-13 DIAGNOSIS — I10 Essential (primary) hypertension: Secondary | ICD-10-CM

## 2019-03-13 DIAGNOSIS — M542 Cervicalgia: Secondary | ICD-10-CM

## 2019-03-13 DIAGNOSIS — E1121 Type 2 diabetes mellitus with diabetic nephropathy: Secondary | ICD-10-CM

## 2019-03-13 LAB — POCT GLYCOSYLATED HEMOGLOBIN (HGB A1C): Hemoglobin A1C: 6.9 % — AB (ref 4.0–5.6)

## 2019-03-13 LAB — GLUCOSE, POCT (MANUAL RESULT ENTRY): POC Glucose: 91 mg/dl (ref 70–99)

## 2019-03-13 MED ORDER — CYCLOBENZAPRINE HCL 10 MG PO TABS: 10.0000 mg | ORAL_TABLET | Freq: Three times a day (TID) | ORAL | 0 refills | Status: DC | PRN

## 2019-03-13 MED ORDER — NAPROXEN 500 MG PO TABS: 500.0000 mg | ORAL_TABLET | Freq: Two times a day (BID) | ORAL | 0 refills | Status: DC

## 2019-03-13 MED FILL — NAPROXEN 500 MG TABLET: 500 | 30 days supply | Qty: 60 | Fill #0

## 2019-03-13 MED FILL — CYCLOBENZAPRINE 10 MG TAB: 10 | 20 days supply | Qty: 60 | Fill #0
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
## 2019-03-14 ENCOUNTER — Encounter: Payer: Self-pay | Admitting: Nurse Practitioner

## 2019-03-27 MED FILL — ?ATORVASTATIN 40MG TABLET: 40 | 30 days supply | Qty: 30 | Fill #1

## 2019-03-27 MED FILL — LISINOPRIL 10 MG TABS: 10 | 30 days supply | Qty: 30 | Fill #3

## 2019-05-03 MED FILL — metFORMIN HCL 1000 MG TABS: 1000 | 30 days supply | Qty: 60 | Fill #2

## 2019-05-03 MED FILL — ?ATORVASTATIN 40MG TABLET: 40 | 30 days supply | Qty: 30 | Fill #2

## 2019-05-03 MED FILL — LISINOPRIL 10 MG TABS: 10 | 30 days supply | Qty: 30 | Fill #4

## 2019-06-04 MED FILL — metFORMIN HCL 1000 MG TABS: 1000 | 30 days supply | Qty: 60 | Fill #3

## 2019-06-04 MED FILL — ?ATORVASTATIN 40MG TABLET: 40 | 30 days supply | Qty: 30 | Fill #3

## 2019-06-04 MED FILL — LISINOPRIL 10 MG TABS: 10 | 30 days supply | Qty: 30 | Fill #5

## 2019-06-13 ENCOUNTER — Ambulatory Visit: Payer: Self-pay | Admitting: Nurse Practitioner

## 2019-06-18 ENCOUNTER — Encounter: Payer: Self-pay | Admitting: Nurse Practitioner

## 2019-06-18 ENCOUNTER — Ambulatory Visit (HOSPITAL_BASED_OUTPATIENT_CLINIC_OR_DEPARTMENT_OTHER): Payer: Self-pay | Admitting: Pharmacist

## 2019-06-18 ENCOUNTER — Encounter: Payer: Self-pay | Admitting: Pharmacist

## 2019-06-18 ENCOUNTER — Ambulatory Visit: Payer: Self-pay | Attending: Nurse Practitioner | Admitting: Nurse Practitioner

## 2019-06-18 ENCOUNTER — Other Ambulatory Visit: Payer: Self-pay

## 2019-06-18 VITALS — BP 124/88 | HR 106 | Temp 98.5°F | Ht 73.0 in | Wt 220.0 lb

## 2019-06-18 DIAGNOSIS — E119 Type 2 diabetes mellitus without complications: Secondary | ICD-10-CM

## 2019-06-18 DIAGNOSIS — E1121 Type 2 diabetes mellitus with diabetic nephropathy: Secondary | ICD-10-CM

## 2019-06-18 DIAGNOSIS — Z23 Encounter for immunization: Secondary | ICD-10-CM

## 2019-06-18 DIAGNOSIS — M542 Cervicalgia: Secondary | ICD-10-CM

## 2019-06-18 DIAGNOSIS — E782 Mixed hyperlipidemia: Secondary | ICD-10-CM

## 2019-06-18 DIAGNOSIS — I10 Essential (primary) hypertension: Secondary | ICD-10-CM

## 2019-06-18 LAB — POCT GLYCOSYLATED HEMOGLOBIN (HGB A1C): Hemoglobin A1C: 6.7 % — AB (ref 4.0–5.6)

## 2019-06-18 LAB — GLUCOSE, POCT (MANUAL RESULT ENTRY): POC Glucose: 103 mg/dl — AB (ref 70–99)

## 2019-06-18 MED ORDER — LISINOPRIL 10 MG PO TABS: 10.0000 mg | ORAL_TABLET | Freq: Every day | ORAL | 1 refills | Status: DC

## 2019-06-18 MED ORDER — NAPROXEN 500 MG PO TABS: 500.0000 mg | ORAL_TABLET | Freq: Two times a day (BID) | ORAL | 1 refills | Status: DC

## 2019-06-18 MED ORDER — ATORVASTATIN CALCIUM 40 MG PO TABS: 40.0000 mg | ORAL_TABLET | Freq: Every day | ORAL | 1 refills | Status: DC

## 2019-06-18 MED ORDER — TRUEPLUS LANCETS 28G MISC: 3 refills | Status: DC

## 2019-06-18 MED ORDER — GLUCOSE BLOOD VI STRP: ORAL_STRIP | 12 refills | Status: DC

## 2019-06-18 MED ORDER — CYCLOBENZAPRINE HCL 10 MG PO TABS: 10.0000 mg | ORAL_TABLET | Freq: Three times a day (TID) | ORAL | 0 refills | Status: DC | PRN

## 2019-06-18 MED ORDER — METFORMIN HCL 1000 MG PO TABS: 1000.0000 mg | ORAL_TABLET | Freq: Two times a day (BID) | ORAL | 3 refills | Status: DC

## 2019-06-18 MED FILL — TRUE METRIX TEST STRIP: 30 days supply | Qty: 100 | Fill #0

## 2019-06-18 MED FILL — CYCLOBENZAPRINE 10 MG TAB: 10 | 20 days supply | Qty: 60 | Fill #0

## 2019-06-18 MED FILL — TRUEplus LANCETS 28G MISC: 30 days supply | Qty: 100 | Fill #0

## 2019-06-18 MED FILL — NAPROXEN 500 MG TABLET: 500 | 30 days supply | Qty: 60 | Fill #0

## 2019-06-18 NOTE — Patient Instructions (Signed)
Chronic Back Pain When back pain lasts longer than 3 months, it is called chronic back pain. Pain may get worse at certain times (flare-ups). There are things you can do at home to manage your pain. Follow these instructions at home: Activity      Avoid bending and other activities that make pain worse.  When standing: ? Keep your upper back and neck straight. ? Keep your shoulders pulled back. ? Avoid slouching.  When sitting: ? Keep your back straight. ? Relax your shoulders. Do not round your shoulders or pull them backward.  Do not sit or stand in one place for long periods of time.  Take short rest breaks during the day. Lying down or standing is usually better than sitting. Resting can help relieve pain.  When sitting or lying down for a long time, do some mild activity or stretching. This will help to prevent stiffness and pain.  Get regular exercise. Ask your doctor what activities are safe for you.  Do not lift anything that is heavier than 10 lb (4.5 kg). To prevent injury when you lift things: ? Bend your knees. ? Keep the weight close to your body. ? Avoid twisting. Managing pain  If told, put ice on the painful area. Your doctor may tell you to use ice for 24-48 hours after a flare-up starts. ? Put ice in a plastic bag. ? Place a towel between your skin and the bag. ? Leave the ice on for 20 minutes, 2-3 times a day.  If told, put heat on the painful area as often as told by your doctor. Use the heat source that your doctor recommends, such as a moist heat pack or a heating pad. ? Place a towel between your skin and the heat source. ? Leave the heat on for 20-30 minutes. ? Remove the heat if your skin turns bright red. This is especially important if you are unable to feel pain, heat, or cold. You may have a greater risk of getting burned.  Soak in a warm bath. This can help relieve pain.  Take over-the-counter and prescription medicines only as told by your  doctor. General instructions  Sleep on a firm mattress. Try lying on your side with your knees slightly bent. If you lie on your back, put a pillow under your knees.  Keep all follow-up visits as told by your doctor. This is important. Contact a doctor if:  You have pain that does not get better with rest or medicine. Get help right away if:  One or both of your arms or legs feel weak.  One or both of your arms or legs lose feeling (numbness).  You have trouble controlling when you poop (bowel movement) or pee (urinate).  You feel sick to your stomach (nauseous).  You throw up (vomit).  You have belly (abdominal) pain.  You have shortness of breath.  You pass out (faint). Summary  When back pain lasts longer than 3 months, it is called chronic back pain.  Pain may get worse at certain times (flare-ups).  Use ice and heat as told by your doctor. Your doctor may tell you to use ice after flare-ups. This information is not intended to replace advice given to you by your health care provider. Make sure you discuss any questions you have with your health care provider. Document Released: 03/08/2008 Document Revised: 01/11/2019 Document Reviewed: 05/05/2017 Elsevier Patient Education  2020 [place] Need to Know About Chronic  Back Pain Long-term (chronic) back pain is back pain that lasts for 12 weeks or longer. It often affects the lower back and can range from mild to severe. Many people have back pain at some point in their lives. It can feel different to each person. It may feel like a muscle ache or a sharp, stabbing pain. The pain often gets worse over time. It can be difficult to find the cause of chronic back pain. Treating chronic back pain often starts with rest and pain relief, followed by exercises (physical therapy) to strengthen the muscles that support your back. You may have to try different things to see what works best for you. If other treatments do  not help, or if your pain is caused by a condition or an injury, you may need surgery. How can back pain affect me? Chronic back pain is uncomfortable and can make it hard to do your usual daily activities. Chronic back pain can:  Cause numbness and tingling.  Come and go.  Get worse when you are sitting, standing, walking, bending, or lifting.  Affect you while you are active, at rest, or both.  Eventually make it hard to move around.  Occur with fever, weight loss, or difficulty urinating. What are the benefits of treating back pain? Treating chronic back pain may:  Relieve pain.  Keep your pain from getting worse.  Make it easier for you to do your usual activities. What are some steps I can take to decrease my back pain?   Take over-the-counter or prescription medicines only as told by your health care provider.  If directed, apply heat to the affected area. Use the heat source that your health care provider recommends, such as a moist heat pack or a heating pad. ? Place a towel between your skin and the heat source. ? Leave the heat on for 20-30 minutes. ? Remove the heat if your skin turns bright red. This is especially important if you are unable to feel pain, heat, or cold. You may have a greater risk of getting burned.  If directed, put ice on the affected area: ? Put ice in a plastic bag. ? Place a towel between your skin and the bag. ? Leave the ice on for 20 minutes, 2-3 times a day.  Get regular exercise as told by your health care provider to improve flexibility and strength.  Do not smoke.  Maintain a healthy weight.  When lifting objects: ? Keep your feet as far apart as your shoulders (shoulder-width apart) or farther apart. ? Tighten the muscles in your abdomen. ? Bend your knees and hips and keep your spine neutral. It is important to lift using the strength of your legs, not your back. Do not lock your knees straight out. ? Always ask for help to  lift heavy or awkward objects. What can happen if my back pain goes untreated? Untreated back pain can:  Get worse over time.  Start to occur more often or at different times, such as when you are resting.  Cause posture problems.  Make it hard to move around (limit mobility). Where can I get support? Chronic back pain can be a frustrating condition to manage. It may help to talk with other people who are having a similar experience. Consider joining a support group for people dealing with chronic back pain. Ask your health care provider about support groups in your area. You can also find online and in-person support groups through:  The  American Chronic Pain Association: DeluxeOption.si  The U.S. Pain Foundation: uspainfoundation.org/support-groups Contact a health care provider if:  Your symptoms do not get better or they get worse.  You have severe back pain.  You have chronic back pain and a fever.  You lose weight without trying.  You have difficulty urinating.  You experience numbness or tingling.  You develop new pain after an injury. Summary  Chronic back pain is often treated with rest, pain relief, and physical therapy.  Get regular exercise to improve your strength and flexibility.  Put heat and ice on the affected areas as directed by your health care provider.  Chronic back pain can be challenging to live with. Joining a support group may help you manage your condition. This information is not intended to replace advice given to you by your health care provider. Make sure you discuss any questions you have with your health care provider. Document Released: 10/05/2015 Document Revised: 09/02/2017 Document Reviewed: 05/29/2016 Elsevier Patient Education  2020 [employer].

## 2019-06-18 NOTE — Progress Notes (Signed)
Patient presents for vaccination against influenza per orders of Zelda. Consent given. Counseling provided. No contraindications exists. Vaccine administered without incident.   

## 2019-06-18 NOTE — Progress Notes (Signed)
Assessment & Plan:  Kyle Campos was seen today for follow-up.  Diagnoses and all orders for this visit:  Controlled type 2 diabetes mellitus without complication, without long-term current use of insulin (HCC) -     Glucose (CBG) -     HgB A1c -     glucose blood test strip; Use as instructed. Monitor blood glucose levels twice per day. -     metFORMIN (GLUCOPHAGE) 1000 MG tablet; Take 1 tablet (1,000 mg total) by mouth 2 (two) times daily with a meal. -     TRUEplus Lancets 28G MISC; Use as instructed. Monitor blood glucose levels twice per day. -     Ambulatory referral to Ophthalmology Continue blood sugar control as discussed in office today, low carbohydrate diet, and regular physical exercise as tolerated, 150 minutes per week (30 min each day, 5 days per week, or 50 min 3 days per week). Keep blood sugar logs with fasting goal of 90-130 mg/dl, post prandial (after you eat) less than 180.  For Hypoglycemia: BS <60 and Hyperglycemia BS >400; contact the clinic ASAP. Annual eye exams and foot exams are recommended.   Essential hypertension -     CMP14+EGFR -     lisinopril (ZESTRIL) 10 MG tablet; Take 1 tablet (10 mg total) by mouth daily.  Continue all antihypertensives as prescribed.  Remember to bring in your blood pressure log with you for your follow up appointment.  DASH/Mediterranean Diets are healthier choices for HTN.   Mixed hyperlipidemia -     CMP14+EGFR -     atorvastatin (LIPITOR) 40 MG tablet; Take 1 tablet (40 mg total) by mouth daily. INSTRUCTIONS: Work on a low fat, heart healthy diet and participate in regular aerobic exercise program by working out at least 150 minutes per week; 5 days a week-30 minutes per day. Avoid red meat, fried foods. junk foods, sodas, sugary drinks, unhealthy snacking, alcohol and smoking.  Drink at least 48oz of water per day and monitor your carbohydrate intake daily.    Cervical pain (neck) -     cyclobenzaprine (FLEXERIL) 10 MG  tablet; Take 1 tablet (10 mg total) by mouth 3 (three) times daily as needed for muscle spasms. -     naproxen (NAPROSYN) 500 MG tablet; Take 1 tablet (500 mg total) by mouth 2 (two) times daily with a meal.    Patient has been counseled on age-appropriate routine health concerns for screening and prevention. These are reviewed and up-to-date. Referrals have been placed accordingly. Immunizations are up-to-date or declined.    Subjective:   Chief Complaint  Patient presents with  . Follow-up    Pt. is here to follow up on diabetes.    HPI Kyle Campos 63 y.o. male presents to office today for DM and HTN.  has a past medical history of Colon polyps (03/14/2015), Diabetes mellitus ([place]), Diverticulosis, Hepatic steatosis, Hyperlipidemia, Hypertension, Lung nodules, and Smoker.  Doing well today. Trying to cut back on his smoking. Down from over a ppd to now less than 1/2 ppd. Declines smoking cessation therapy today.    DM TYPE 2 Well controlled. Down from 7.1 to 6.9 today.  Taking metformin 1000 mg BID. He has not been monitoring his blood glucose levels but tends to monitor his carbohydrate intake and stays active as much as he can. His weight is down today as well.  Lab Results  Component Value Date   HGBA1C 6.7 (A) 06/18/2019    Essential Hypertension Chronic and  well controlled. Taking lisinopril 53m daily as prescribed. He does not monitor his blood pressure at home. Denies chest pain, shortness of breath, palpitations, lightheadedness, dizziness, headaches or BLE edema.  BP Readings from Last 3 Encounters:  06/18/19 124/88  03/13/19 129/67  12/08/18 119/79    Hyperlipidemia Patient presents for follow up to hyperlipidemia. LDL is at goal.  He is medication compliant taking atorvastatin 40 mg daily as prescribed. He is diet compliant and denies  statin intolerance including myalgias.  Lab Results  Component Value Date   CHOL 133 12/08/2018   Lab Results  Component  Value Date   HDL 42 12/08/2018   Lab Results  Component Value Date   LDLCALC 48 12/08/2018   Lab Results  Component Value Date   TRIG 214 (H) 12/08/2018   Lab Results  Component Value Date   CHOLHDL 3.2 12/08/2018   Review of Systems  Constitutional: Negative for fever, malaise/fatigue and weight loss.  HENT: Negative.  Negative for nosebleeds.   Eyes: Negative.  Negative for blurred vision, double vision and photophobia.  Respiratory: Negative.  Negative for cough and shortness of breath.   Cardiovascular: Negative.  Negative for chest pain, palpitations and leg swelling.  Gastrointestinal: Negative.  Negative for heartburn, nausea and vomiting.  Musculoskeletal: Positive for neck pain (chronic and controlled with prn use of naproxen and flexeril). Negative for myalgias.  Neurological: Negative.  Negative for dizziness, focal weakness, seizures and headaches.  Psychiatric/Behavioral: Negative.  Negative for suicidal ideas.    Past Medical History:  Diagnosis Date  . Colon polyps 03/14/2015   Tubular adenoma x 6  . Diabetes mellitus ([place]   . Diverticulosis   . Hepatic steatosis   . Hyperlipidemia   . Hypertension   . Lung nodules   . Smoker     Past Surgical History:  Procedure Laterality Date  . APPENDECTOMY  at age 89  . colonoscopy  03/14/2015    Family History  Problem Relation Age of Onset  . Diabetes Mother   . Cancer Father        lung cancer   . Stroke Maternal Grandfather   . Colon polyps Brother   . Colon cancer Neg Hx   . Esophageal cancer Neg Hx   . Rectal cancer Neg Hx   . Stomach cancer Neg Hx     Social History Reviewed with no changes to be made today.   Outpatient Medications Prior to Visit  Medication Sig Dispense Refill  . albuterol (PROVENTIL HFA;VENTOLIN HFA) 108 (90 Base) MCG/ACT inhaler Inhale 2 puffs into the lungs every 6 (six) hours as needed for wheezing or shortness of breath. 1 Inhaler 2  . Blood Glucose Monitoring Suppl  (TRUE METRIX METER) w/Device KIT Use as instructed. Monitor blood glucose levels twice per day. 1 kit 0  . cyclobenzaprine (FLEXERIL) 10 MG tablet Take 1 tablet (10 mg total) by mouth 3 (three) times daily as needed for muscle spasms. 60 tablet 0  . glucose blood test strip Use as instructed. Monitor blood glucose levels twice per day. 100 each 12  . lisinopril (PRINIVIL,ZESTRIL) 10 MG tablet Take 1 tablet (10 mg total) by mouth daily. 90 tablet 1  . metFORMIN (GLUCOPHAGE) 1000 MG tablet Take 1 tablet (1,000 mg total) by mouth 2 (two) times daily with a meal. 180 tablet 3  . naproxen (NAPROSYN) 500 MG tablet Take 1 tablet (500 mg total) by mouth 2 (two) times daily with a meal. 60 tablet 0  .  TRUEplus Lancets 28G MISC Use as instructed. Monitor blood glucose levels twice per day. 100 each 3  . atorvastatin (LIPITOR) 40 MG tablet Take 1 tablet (40 mg total) by mouth daily. 90 tablet 1   No facility-administered medications prior to visit.     No Known Allergies     Objective:    BP 124/88 (BP Location: Right Arm, Patient Position: Sitting, Cuff Size: Large)   Pulse (!) 106   Temp 98.5 F (36.9 C) (Oral)   Ht 6' 1" (1.854 m)   Wt 220 lb (99.8 kg)   SpO2 96%   BMI 29.03 kg/m  Wt Readings from Last 3 Encounters:  06/18/19 220 lb (99.8 kg)  03/13/19 223 lb (101.2 kg)  12/08/18 228 lb 3.2 oz (103.5 kg)    Physical Exam Vitals signs and nursing note reviewed.  Constitutional:      Appearance: He is well-developed.  HENT:     Head: Normocephalic and atraumatic.  Neck:     Musculoskeletal: Normal range of motion.  Cardiovascular:     Rate and Rhythm: Regular rhythm. Tachycardia present.     Heart sounds: Normal heart sounds. No murmur. No friction rub. No gallop.   Pulmonary:     Effort: Pulmonary effort is normal. No tachypnea or respiratory distress.     Breath sounds: Normal breath sounds. No decreased breath sounds, wheezing, rhonchi or rales.  Chest:     Chest wall: No  tenderness.  Abdominal:     General: Bowel sounds are normal.     Palpations: Abdomen is soft.  Musculoskeletal: Normal range of motion.  Skin:    General: Skin is warm and dry.  Neurological:     Mental Status: He is alert and oriented to person, place, and time.     Coordination: Coordination normal.  Psychiatric:        Behavior: Behavior normal. Behavior is cooperative.        Thought Content: Thought content normal.        Judgment: Judgment normal.          Patient has been counseled extensively about nutrition and exercise as well as the importance of adherence with medications and regular follow-up. The patient was given clear instructions to go to ER or return to medical center if symptoms don't improve, worsen or new problems develop. The patient verbalized understanding.   Follow-up: Return in about 3 months (around 09/17/2019).   Gildardo Pounds, FNP-BC [facility] and [facility], [place]   06/19/2019, 10:35 PM

## 2019-06-19 ENCOUNTER — Encounter: Payer: Self-pay | Admitting: Nurse Practitioner

## 2019-06-19 LAB — CMP14+EGFR
ALT: 28 IU/L (ref 0–44)
AST: 16 IU/L (ref 0–40)
Albumin/Globulin Ratio: 1.8 (ref 1.2–2.2)
Albumin: 5.1 g/dL — ABNORMAL HIGH (ref 3.8–4.9)
Alkaline Phosphatase: 69 IU/L (ref 39–117)
BUN/Creatinine Ratio: 18 (ref 10–24)
BUN: 19 mg/dL (ref 8–27)
Bilirubin Total: 0.4 mg/dL (ref 0.0–1.2)
CO2: 24 mmol/L (ref 20–29)
Calcium: 10.2 mg/dL (ref 8.6–10.2)
Chloride: 99 mmol/L (ref 96–106)
Creatinine, Ser: 1.04 mg/dL (ref 0.76–1.27)
GFR calc Af Amer: 90 mL/min/{1.73_m2} (ref >59)
GFR calc non Af Amer: 78 mL/min/{1.73_m2} (ref >59)
Globulin, Total: 2.9 g/dL (ref 1.5–4.5)
Glucose: 87 mg/dL (ref 65–99)
Potassium: 4.7 mmol/L (ref 3.5–5.2)
Sodium: 138 mmol/L (ref 134–144)
Total Protein: 8 g/dL (ref 6.0–8.5)

## 2019-07-09 MED FILL — ?NAPROXEN 500 MG TABS: 500 | 30 days supply | Qty: 60 | Fill #0

## 2019-07-09 MED FILL — ?metFORMIN HCL 1000MG TABL: 1000 | 90 days supply | Qty: 180 | Fill #0

## 2019-07-09 MED FILL — LISINOPRIL 10 MG TABS: 10 | 90 days supply | Qty: 90 | Fill #0

## 2019-07-09 MED FILL — ?ATORVASTATIN 40MG TABLET: 40 | 30 days supply | Qty: 30 | Fill #4

## 2019-07-09 MED FILL — CYCLOBENZAPRINE 10 MG TAB: 10 | 20 days supply | Qty: 60 | Fill #0

## 2019-08-17 MED FILL — ?ATORVASTATIN 40MG TABLET: 40 | 30 days supply | Qty: 30 | Fill #5

## 2019-09-17 ENCOUNTER — Other Ambulatory Visit: Payer: Self-pay

## 2019-09-17 ENCOUNTER — Ambulatory Visit: Payer: Self-pay | Attending: Nurse Practitioner | Admitting: Nurse Practitioner

## 2019-10-15 ENCOUNTER — Ambulatory Visit: Payer: Self-pay | Attending: Nurse Practitioner | Admitting: Nurse Practitioner

## 2019-10-15 ENCOUNTER — Other Ambulatory Visit: Payer: Self-pay

## 2019-10-15 ENCOUNTER — Other Ambulatory Visit: Payer: Self-pay | Admitting: Nurse Practitioner

## 2019-10-15 ENCOUNTER — Encounter: Payer: Self-pay | Admitting: Nurse Practitioner

## 2019-10-15 DIAGNOSIS — I10 Essential (primary) hypertension: Secondary | ICD-10-CM

## 2019-10-15 DIAGNOSIS — E119 Type 2 diabetes mellitus without complications: Secondary | ICD-10-CM

## 2019-10-15 DIAGNOSIS — F17210 Nicotine dependence, cigarettes, uncomplicated: Secondary | ICD-10-CM

## 2019-10-15 DIAGNOSIS — M542 Cervicalgia: Secondary | ICD-10-CM

## 2019-10-15 DIAGNOSIS — E782 Mixed hyperlipidemia: Secondary | ICD-10-CM

## 2019-10-15 DIAGNOSIS — F17200 Nicotine dependence, unspecified, uncomplicated: Secondary | ICD-10-CM

## 2019-10-15 MED ORDER — ATORVASTATIN CALCIUM 40 MG PO TABS: 40.0000 mg | ORAL_TABLET | Freq: Every day | ORAL | 1 refills | Status: DC

## 2019-10-15 NOTE — Progress Notes (Signed)
Virtual Visit via Telephone Note Due to national recommendations of social distancing due to [place] 19, telehealth visit is felt to be most appropriate for this patient at this time.  I discussed the limitations, risks, security and privacy concerns of performing an evaluation and management service by telephone and the availability of in person appointments. I also discussed with the patient that there may be a patient responsible charge related to this service. The patient expressed understanding and agreed to proceed.    I connected with Janan Halter on 10/15/19  at   3:10 PM EST  EDT by telephone and verified that I am speaking with the correct person using two identifiers.   Consent I discussed the limitations, risks, security and privacy concerns of performing an evaluation and management service by telephone and the availability of in person appointments. I also discussed with the patient that there may be a patient responsible charge related to this service. The patient expressed understanding and agreed to proceed.   Location of Patient: Private Residence   Location of Provider: [place] and [place] participating in Telemedicine visit: Geryl Rankins FNP-BC [place]    History of Present Illness: Telemedicine visit for: Follow Up  DM TYPE 2 Does not monitor his blood glucose levels often as blood glucose levels are well controlled. Denies any hypo or hyperglycemic symptoms. Endorses medication compliance taking metformin 1000 mg BID. Taking STATIN and ACE. Overdue for eye exam.  Lab Results  Component Value Date   HGBA1C 6.7 (A) 06/18/2019   Essential Hypertension Blood pressure is well controlled. Taking lisinopril 10 mg daily as prescribed. Denies chest pain, shortness of breath, palpitations, lightheadedness, dizziness, headaches or BLE edema.  BP Readings from Last 3 Encounters:  06/18/19 124/88  03/13/19 129/67   12/08/18 119/79   Dyslipidemia LDL at goal <70. Taking atorvastatin 40 mg daily as prescribed. Denies any statin intolerance or myalgias.  Lab Results  Component Value Date   LDLCALC 48 12/08/2018   Past Medical History:  Diagnosis Date  . Colon polyps 03/14/2015   Tubular adenoma x 6  . Diabetes mellitus ([place])   . Diverticulosis   . Hepatic steatosis   . Hyperlipidemia   . Hypertension   . Lung nodules   . Smoker     Past Surgical History:  Procedure Laterality Date  . APPENDECTOMY  at age 26   . colonoscopy  03/14/2015    Family History  Problem Relation Age of Onset  . Diabetes Mother   . Cancer Father        lung cancer   . Stroke Maternal Grandfather   . Colon polyps Brother   . Colon cancer Neg Hx   . Esophageal cancer Neg Hx   . Rectal cancer Neg Hx   . Stomach cancer Neg Hx     Social History   Socioeconomic History  . Marital status: Single    Spouse name: Not on file  . Number of children: Not on file  . Years of education: Not on file  . Highest education level: Not on file  Occupational History  . Not on file  Tobacco Use  . Smoking status: Current Every Day Smoker    Packs/day: 1.00    Years: 40.00    Pack years: 40.00    Types: Cigarettes  . Smokeless tobacco: Never Used  Substance and Sexual Activity  . Alcohol use: Not Currently    Alcohol/week:  0.0 standard drinks    Comment: once monthly  . Drug use: No  . Sexual activity: Not on file  Other Topics Concern  . Not on file  Social History Narrative  . Not on file   Social Determinants of Health   Financial Resource Strain:   . Difficulty of Paying Living Expenses: Not on file  Food Insecurity:   . Worried About Charity fundraiser in the Last Year: Not on file  . Ran Out of Food in the Last Year: Not on file  Transportation Needs:   . Lack of Transportation (Medical): Not on file  . Lack of Transportation (Non-Medical): Not on file  Physical Activity:   . Days of Exercise  per Week: Not on file  . Minutes of Exercise per Session: Not on file  Stress:   . Feeling of Stress : Not on file  Social Connections:   . Frequency of Communication with Friends and Family: Not on file  . Frequency of Social Gatherings with Friends and Family: Not on file  . Attends Religious Services: Not on file  . Active Member of Clubs or Organizations: Not on file  . Attends Archivist Meetings: Not on file  . Marital Status: Not on file     Observations/Objective: Awake, alert and oriented x 3   Review of Systems  Constitutional: Negative for fever, malaise/fatigue and weight loss.  HENT: Negative.  Negative for nosebleeds.   Eyes: Negative.  Negative for blurred vision, double vision and photophobia.  Respiratory: Negative.  Negative for cough and shortness of breath.   Cardiovascular: Negative.  Negative for chest pain, palpitations and leg swelling.  Gastrointestinal: Negative.  Negative for heartburn, nausea and vomiting.  Musculoskeletal: Negative.  Negative for myalgias.  Neurological: Negative.  Negative for dizziness, focal weakness, seizures and headaches.  Psychiatric/Behavioral: Negative.  Negative for suicidal ideas.    Assessment and Plan: Millie was seen today for follow-up.  Diagnoses and all orders for this visit:  Controlled type 2 diabetes mellitus without complication, without long-term current use of insulin (HCC) Continue blood sugar control as discussed in office today, low carbohydrate diet, and regular physical exercise as tolerated, 150 minutes per week (30 min each day, 5 days per week, or 50 min 3 days per week). Keep blood sugar logs with fasting goal of 90-130 mg/dl, post prandial (after you eat) less than 180.  For Hypoglycemia: BS <60 and Hyperglycemia BS >400; contact the clinic ASAP. Annual eye exams and foot exams are recommended.   Mixed hyperlipidemia -     atorvastatin (LIPITOR) 40 MG tablet; Take 1 tablet (40 mg total) by  mouth daily. INSTRUCTIONS: Work on a low fat, heart healthy diet and participate in regular aerobic exercise program by working out at least 150 minutes per week; 5 days a week-30 minutes per day. Avoid red meat/beef/steak,  fried foods. junk foods, sodas, sugary drinks, unhealthy snacking, alcohol and smoking.  Drink at least 80 oz of water per day and monitor your carbohydrate intake daily.    Essential hypertension Continue all antihypertensives as prescribed.  Remember to bring in your blood pressure log with you for your follow up appointment.  DASH/Mediterranean Diets are healthier choices for HTN.     Follow Up Instructions Return in about 3 months (around 01/13/2020).     I discussed the assessment and treatment plan with the patient. The patient was provided an opportunity to ask questions and all were answered. The patient agreed  with the plan and demonstrated an understanding of the instructions.   The patient was advised to call back or seek an in-person evaluation if the symptoms worsen or if the condition fails to improve as anticipated.  I provided 16 minutes of non-face-to-face time during this encounter including median intraservice time, reviewing previous notes, labs, imaging, medications and explaining diagnosis and management.  Gildardo Pounds, FNP-BC

## 2019-10-16 MED FILL — ATORVASTATIN CALCIUM 40 MG: 40 | 30 days supply | Qty: 30 | Fill #0

## 2019-10-22 MED ORDER — NAPROXEN 500 MG PO TABS: 500.0000 mg | ORAL_TABLET | Freq: Two times a day (BID) | ORAL | 1 refills | Status: DC

## 2019-10-22 MED ORDER — CYCLOBENZAPRINE HCL 10 MG PO TABS: 10.0000 mg | ORAL_TABLET | Freq: Three times a day (TID) | ORAL | 0 refills | Status: DC | PRN

## 2019-10-22 MED ORDER — METFORMIN HCL 1000 MG PO TABS: 1000.0000 mg | ORAL_TABLET | Freq: Two times a day (BID) | ORAL | 3 refills | Status: DC

## 2019-10-22 MED ORDER — LISINOPRIL 10 MG PO TABS: 10.0000 mg | ORAL_TABLET | Freq: Every day | ORAL | 1 refills | Status: DC

## 2019-10-29 MED FILL — CYCLOBENZAPRINE 10 MG TAB: 10 | 20 days supply | Qty: 60 | Fill #0

## 2019-10-29 MED FILL — metFORMIN HCL 1000 MG TABS: 1000 | 30 days supply | Qty: 60 | Fill #0

## 2019-10-29 MED FILL — ?NAPROXEN 500 MG TABS: 500 | 30 days supply | Qty: 60 | Fill #0

## 2019-10-29 MED FILL — ATORVASTATIN CALCIUM 40 MG: 40 | 30 days supply | Qty: 30 | Fill #0

## 2019-10-29 MED FILL — LISINOPRIL 10 MG TABS: 10 | 30 days supply | Qty: 30 | Fill #0

## 2019-12-17 MED FILL — ?ATORVASTATIN 40MG TABLET: 40 | 30 days supply | Qty: 30 | Fill #1

## 2019-12-17 MED FILL — LISINOPRIL 10 MG TABS: 10 | 30 days supply | Qty: 30 | Fill #1

## 2020-01-14 ENCOUNTER — Other Ambulatory Visit: Payer: Self-pay

## 2020-01-14 ENCOUNTER — Other Ambulatory Visit: Payer: Self-pay | Admitting: Nurse Practitioner

## 2020-01-14 ENCOUNTER — Ambulatory Visit: Payer: Self-pay | Attending: Nurse Practitioner | Admitting: Nurse Practitioner

## 2020-01-14 ENCOUNTER — Encounter: Payer: Self-pay | Admitting: Nurse Practitioner

## 2020-01-14 VITALS — BP 128/84 | HR 84 | Temp 97.7°F | Ht 73.0 in | Wt 224.0 lb

## 2020-01-14 DIAGNOSIS — Z130 Encounter for screening for diseases of the blood and blood-forming organs and certain disorders involving the immune mechanism: Secondary | ICD-10-CM

## 2020-01-14 DIAGNOSIS — M542 Cervicalgia: Secondary | ICD-10-CM

## 2020-01-14 DIAGNOSIS — E119 Type 2 diabetes mellitus without complications: Secondary | ICD-10-CM

## 2020-01-14 DIAGNOSIS — I10 Essential (primary) hypertension: Secondary | ICD-10-CM

## 2020-01-14 DIAGNOSIS — Z122 Encounter for screening for malignant neoplasm of respiratory organs: Secondary | ICD-10-CM

## 2020-01-14 DIAGNOSIS — E782 Mixed hyperlipidemia: Secondary | ICD-10-CM

## 2020-01-14 DIAGNOSIS — Z125 Encounter for screening for malignant neoplasm of prostate: Secondary | ICD-10-CM

## 2020-01-14 LAB — POCT GLYCOSYLATED HEMOGLOBIN (HGB A1C): Hemoglobin A1C: 6.8 % — AB (ref 4.0–5.6)

## 2020-01-14 LAB — GLUCOSE, POCT (MANUAL RESULT ENTRY): POC Glucose: 248 mg/dl — AB (ref 70–99)

## 2020-01-14 MED ORDER — NAPROXEN 500 MG PO TABS: 500.0000 mg | ORAL_TABLET | Freq: Two times a day (BID) | ORAL | 1 refills | Status: DC

## 2020-01-14 MED ORDER — METFORMIN HCL 1000 MG PO TABS: 1000.0000 mg | ORAL_TABLET | Freq: Two times a day (BID) | ORAL | 3 refills | Status: DC

## 2020-01-14 MED ORDER — CYCLOBENZAPRINE HCL 10 MG PO TABS: 10.0000 mg | ORAL_TABLET | Freq: Three times a day (TID) | ORAL | 0 refills | Status: DC | PRN

## 2020-01-14 MED ORDER — ATORVASTATIN CALCIUM 40 MG PO TABS: 40.0000 mg | ORAL_TABLET | Freq: Every day | ORAL | 1 refills | Status: DC

## 2020-01-14 MED ORDER — TRUEPLUS LANCETS 28G MISC: 3 refills | Status: DC

## 2020-01-14 MED ORDER — LISINOPRIL 10 MG PO TABS: 10.0000 mg | ORAL_TABLET | Freq: Every day | ORAL | 1 refills | Status: DC

## 2020-01-14 MED ORDER — GLUCOSE BLOOD VI STRP: ORAL_STRIP | 12 refills | Status: DC

## 2020-01-14 MED FILL — ?ATORVASTATIN 40MG TABLET: 40 | 30 days supply | Qty: 30 | Fill #0

## 2020-01-14 MED FILL — LISINOPRIL 10 MG TABS: 10 | 30 days supply | Qty: 30 | Fill #0

## 2020-01-14 MED FILL — metFORMIN HCL 1000 MG TABS: 1000 | 30 days supply | Qty: 60 | Fill #0

## 2020-01-14 MED FILL — ?NAPROXEN 500 MG TABS: 500 | 30 days supply | Qty: 60 | Fill #0

## 2020-01-14 MED FILL — CYCLOBENZAPRINE 10 MG TAB: 10 | 20 days supply | Qty: 60 | Fill #0

## 2020-01-14 MED FILL — TRUEplus LANCETS 28G MISC: 50 days supply | Qty: 100 | Fill #0

## 2020-01-14 MED FILL — TRUE METRIX TEST STRIP: 50 days supply | Qty: 100 | Fill #0

## 2020-01-14 NOTE — Progress Notes (Signed)
Assessment & Plan:  Kyle Campos was seen today for follow-up.  Diagnoses and all orders for this visit:  Essential hypertension -     lisinopril (ZESTRIL) 10 MG tablet; Take 1 tablet (10 mg total) by mouth daily. -     CMP14+EGFR Continue all antihypertensives as prescribed.  Remember to bring in your blood pressure log with you for your follow up appointment.  DASH/Mediterranean Diets are healthier choices for HTN.   Controlled type 2 diabetes mellitus without complication, without long-term current use of insulin (HCC) -     Glucose (CBG) -     HgB A1c -     glucose blood test strip; Use as instructed. Monitor blood glucose levels twice per day. -     metFORMIN (GLUCOPHAGE) 1000 MG tablet; Take 1 tablet (1,000 mg total) by mouth 2 (two) times daily with a meal. -     TRUEplus Lancets 28G MISC; Use as instructed. Monitor blood glucose levels twice per day. Continue blood sugar control as discussed in office today, low carbohydrate diet, and regular physical exercise as tolerated, 150 minutes per week (30 min each day, 5 days per week, or 50 min 3 days per week). Keep blood sugar logs with fasting goal of 90-130 mg/dl, post prandial (after you eat) less than 180.  For Hypoglycemia: BS <60 and Hyperglycemia BS >400; contact the clinic ASAP. Annual eye exams and foot exams are recommended.   Mixed hyperlipidemia -     atorvastatin (LIPITOR) 40 MG tablet; Take 1 tablet (40 mg total) by mouth daily. -     Lipid panel INSTRUCTIONS: Work on a low fat, heart healthy diet and participate in regular aerobic exercise program by working out at least 150 minutes per week; 5 days a week-30 minutes per day. Avoid red meat/beef/steak,  fried foods. junk foods, sodas, sugary drinks, unhealthy snacking, alcohol and smoking.  Drink at least 80 oz of water per day and monitor your carbohydrate intake daily.    Cervical pain (neck) Chronic. Pain controlled. Works as a Chief Strategy Officer.  -     cyclobenzaprine  (FLEXERIL) 10 MG tablet; Take 1 tablet (10 mg total) by mouth 3 (three) times daily as needed for muscle spasms. -     naproxen (NAPROSYN) 500 MG tablet; Take 1 tablet (500 mg total) by mouth 2 (two) times daily with a meal.  Prostate cancer screening -     PSA  Screening for deficiency anemia -     CBC    Patient has been counseled on age-appropriate routine health concerns for screening and prevention. These are reviewed and up-to-date. Referrals have been placed accordingly. Immunizations are up-to-date or declined.    Subjective:   Chief Complaint  Patient presents with  . Follow-up   HPI Kyle Campos 64 y.o. male presents to office today for follow up. Doing well today. He does have a history of pulmonary nodules. I have ordered low dose lung CT for lung cancer screening due to risk.   Essential Hypertension Well controlled. Taking lisinopril 10 mg daily as prescribed. Denies chest pain, shortness of breath, palpitations, lightheadedness, dizziness, headaches or BLE edema.  BP Readings from Last 3 Encounters:  01/14/20 128/84  06/18/19 124/88  03/13/19 129/67   Dyslipidemia LDL at goal of <70. Taking atorvastatin daily. Does not endorse any statin intolerance.  Lab Results  Component Value Date   LDLCALC 48 12/08/2018    DM TYPE 2 Glucose elevated today due to breakfast: bacon, egg, mayo,  mustard sandwich. He is taking metformin 1000 mg BID as instructed. Does not endorse any hypo or hyperglycemic symptoms. Overdue for eye exam. Will place referral. Foot exam to be performed at next office visit for physical.    Review of Systems  Constitutional: Negative for fever, malaise/fatigue and weight loss.  HENT: Negative.  Negative for nosebleeds.   Eyes: Negative.  Negative for blurred vision, double vision and photophobia.  Respiratory: Negative.  Negative for cough and shortness of breath.   Cardiovascular: Negative.  Negative for chest pain, palpitations and leg  swelling.  Gastrointestinal: Negative.  Negative for heartburn, nausea and vomiting.  Musculoskeletal: Positive for joint pain, myalgias and neck pain.  Neurological: Negative.  Negative for dizziness, focal weakness, seizures and headaches.  Psychiatric/Behavioral: Negative.  Negative for suicidal ideas.    Past Medical History:  Diagnosis Date  . Colon polyps 03/14/2015   Tubular adenoma x 6  . Diabetes mellitus ([place])   . Diverticulosis   . Hepatic steatosis   . Hyperlipidemia   . Hypertension   . Lung nodules   . Smoker     Past Surgical History:  Procedure Laterality Date  . APPENDECTOMY  at age 21   . colonoscopy  03/14/2015    Family History  Problem Relation Age of Onset  . Diabetes Mother   . Cancer Father        lung cancer   . Stroke Maternal Grandfather   . Colon polyps Brother   . Colon cancer Neg Hx   . Esophageal cancer Neg Hx   . Rectal cancer Neg Hx   . Stomach cancer Neg Hx     Social History Reviewed with no changes to be made today.   Outpatient Medications Prior to Visit  Medication Sig Dispense Refill  . albuterol (PROVENTIL HFA;VENTOLIN HFA) 108 (90 Base) MCG/ACT inhaler Inhale 2 puffs into the lungs every 6 (six) hours as needed for wheezing or shortness of breath. 1 Inhaler 2  . Blood Glucose Monitoring Suppl (TRUE METRIX METER) w/Device KIT Use as instructed. Monitor blood glucose levels twice per day. 1 kit 0  . cyclobenzaprine (FLEXERIL) 10 MG tablet Take 1 tablet (10 mg total) by mouth 3 (three) times daily as needed for muscle spasms. 60 tablet 0  . glucose blood test strip Use as instructed. Monitor blood glucose levels twice per day. 100 each 12  . lisinopril (ZESTRIL) 10 MG tablet Take 1 tablet (10 mg total) by mouth daily. 90 tablet 1  . metFORMIN (GLUCOPHAGE) 1000 MG tablet Take 1 tablet (1,000 mg total) by mouth 2 (two) times daily with a meal. 180 tablet 3  . naproxen (NAPROSYN) 500 MG tablet Take 1 tablet (500 mg total) by mouth 2  (two) times daily with a meal. 60 tablet 1  . TRUEplus Lancets 28G MISC Use as instructed. Monitor blood glucose levels twice per day. 100 each 3  . atorvastatin (LIPITOR) 40 MG tablet Take 1 tablet (40 mg total) by mouth daily. 90 tablet 1   No facility-administered medications prior to visit.    No Known Allergies     Objective:    BP 128/84 (BP Location: Left Arm, Patient Position: Sitting, Cuff Size: Normal)   Pulse 84   Temp 97.7 F (36.5 C) (Temporal)   Ht '6\' 1"'$  (1.854 m)   Wt 224 lb (101.6 kg)   SpO2 97%   BMI 29.55 kg/m  Wt Readings from Last 3 Encounters:  01/14/20 224 lb (101.6 kg)  06/18/19 220 lb (99.8 kg)  03/13/19 223 lb (101.2 kg)    Physical Exam Vitals and nursing note reviewed.  Constitutional:      Appearance: He is well-developed.  HENT:     Head: Normocephalic and atraumatic.  Cardiovascular:     Rate and Rhythm: Normal rate and regular rhythm.     Heart sounds: Normal heart sounds. No murmur. No friction rub. No gallop.   Pulmonary:     Effort: Pulmonary effort is normal. No tachypnea or respiratory distress.     Breath sounds: Normal breath sounds. No decreased breath sounds, wheezing, rhonchi or rales.  Chest:     Chest wall: No tenderness.  Abdominal:     General: Bowel sounds are normal.     Palpations: Abdomen is soft.  Musculoskeletal:        General: Normal range of motion.     Cervical back: Normal range of motion.  Skin:    General: Skin is warm and dry.  Neurological:     Mental Status: He is alert and oriented to person, place, and time.     Coordination: Coordination normal.  Psychiatric:        Behavior: Behavior normal. Behavior is cooperative.        Thought Content: Thought content normal.        Judgment: Judgment normal.          Patient has been counseled extensively about nutrition and exercise as well as the importance of adherence with medications and regular follow-up. The patient was given clear instructions  to go to ER or return to medical center if symptoms don't improve, worsen or new problems develop. The patient verbalized understanding.   Follow-up: Return for physical .   Gildardo Pounds, FNP-BC [facility] and [place], [place]   01/14/2020, 9:28 AM

## 2020-01-15 LAB — CMP14+EGFR
ALT: 25 IU/L (ref 0–44)
AST: 17 IU/L (ref 0–40)
Albumin/Globulin Ratio: 1.7 (ref 1.2–2.2)
Albumin: 4.7 g/dL (ref 3.8–4.8)
Alkaline Phosphatase: 67 IU/L (ref 39–117)
BUN/Creatinine Ratio: 23 (ref 10–24)
BUN: 21 mg/dL (ref 8–27)
Bilirubin Total: 0.5 mg/dL (ref 0.0–1.2)
CO2: 23 mmol/L (ref 20–29)
Calcium: 10 mg/dL (ref 8.6–10.2)
Chloride: 99 mmol/L (ref 96–106)
Creatinine, Ser: 0.93 mg/dL (ref 0.76–1.27)
GFR calc Af Amer: 102 mL/min/{1.73_m2} (ref >59)
GFR calc non Af Amer: 88 mL/min/{1.73_m2} (ref >59)
Globulin, Total: 2.7 g/dL (ref 1.5–4.5)
Glucose: 223 mg/dL — ABNORMAL HIGH (ref 65–99)
Potassium: 4.6 mmol/L (ref 3.5–5.2)
Sodium: 138 mmol/L (ref 134–144)
Total Protein: 7.4 g/dL (ref 6.0–8.5)

## 2020-01-15 LAB — CBC
Hematocrit: 43.2 % (ref 37.5–51.0)
Hemoglobin: 14.7 g/dL (ref 13.0–17.7)
MCH: 30.3 pg (ref 26.6–33.0)
MCHC: 34 g/dL (ref 31.5–35.7)
MCV: 89 fL (ref 79–97)
Platelets: 264 10*3/uL (ref 150–450)
RBC: 4.85 x10E6/uL (ref 4.14–5.80)
RDW: 13.7 % (ref 11.6–15.4)
WBC: 7.4 10*3/uL (ref 3.4–10.8)

## 2020-01-15 LAB — LIPID PANEL
Chol/HDL Ratio: 3.4 ratio (ref 0.0–5.0)
Cholesterol, Total: 130 mg/dL (ref 100–199)
HDL: 38 mg/dL — ABNORMAL LOW (ref >39)
LDL Chol Calc (NIH): 55 mg/dL (ref 0–99)
Triglycerides: 234 mg/dL — ABNORMAL HIGH (ref 0–149)
VLDL Cholesterol Cal: 37 mg/dL (ref 5–40)

## 2020-01-15 LAB — PSA: Prostate Specific Ag, Serum: 0.3 ng/mL (ref 0.0–4.0)

## 2020-02-19 ENCOUNTER — Ambulatory Visit: Payer: Self-pay | Admitting: Nurse Practitioner

## 2020-02-27 MED FILL — ?ATORVASTATIN 40MG TABLET: 40 | 30 days supply | Qty: 30 | Fill #1

## 2020-02-27 MED FILL — LISINOPRIL 10 MG TABS: 10 | 30 days supply | Qty: 30 | Fill #1

## 2020-04-10 MED FILL — ?ATORVASTATIN 40MG TABLET: 40 | 30 days supply | Qty: 30 | Fill #2

## 2020-04-10 MED FILL — ?NAPROXEN 500 MG TABS: 500 | 30 days supply | Qty: 60 | Fill #1

## 2020-04-10 MED FILL — LISINOPRIL 10 MG TABS: 10 | 30 days supply | Qty: 30 | Fill #2

## 2020-04-10 MED FILL — metFORMIN HCL 1000 MG TABS: 1000 | 30 days supply | Qty: 60 | Fill #1

## 2020-05-13 ENCOUNTER — Encounter: Payer: Self-pay | Admitting: Nurse Practitioner

## 2020-05-28 MED FILL — ATORVASTATIN CALCIUM 40 MG: 40 | 30 days supply | Qty: 30 | Fill #3

## 2020-05-28 MED FILL — LISINOPRIL 10 MG TABS: 10 | 30 days supply | Qty: 30 | Fill #3

## 2020-06-20 ENCOUNTER — Ambulatory Visit: Payer: Self-pay | Attending: Nurse Practitioner | Admitting: Nurse Practitioner

## 2020-06-20 ENCOUNTER — Encounter: Payer: Self-pay | Admitting: Nurse Practitioner

## 2020-06-20 ENCOUNTER — Other Ambulatory Visit: Payer: Self-pay

## 2020-06-20 VITALS — BP 122/72 | HR 76 | Temp 97.7°F | Ht 73.0 in | Wt 215.0 lb

## 2020-06-20 DIAGNOSIS — Z0000 Encounter for general adult medical examination without abnormal findings: Secondary | ICD-10-CM

## 2020-06-20 DIAGNOSIS — E119 Type 2 diabetes mellitus without complications: Secondary | ICD-10-CM

## 2020-06-20 DIAGNOSIS — Z23 Encounter for immunization: Secondary | ICD-10-CM

## 2020-06-20 LAB — POCT GLYCOSYLATED HEMOGLOBIN (HGB A1C): Hemoglobin A1C: 6.5 % — AB (ref 4.0–5.6)

## 2020-06-20 LAB — GLUCOSE, POCT (MANUAL RESULT ENTRY): POC Glucose: 170 mg/dl — AB (ref 70–99)

## 2020-06-20 NOTE — Progress Notes (Signed)
Assessment & Plan:  Kyle Campos was seen today for annual exam.  Diagnoses and all orders for this visit:  Encounter for annual physical exam  Controlled type 2 diabetes mellitus without complication, without long-term current use of insulin (HCC) -     Glucose (CBG) -     Microalbumin/Creatinine Ratio, Urine -     Basic metabolic panel -     HgB O0B WELL CONTROLLED Lab Results  Component Value Date   HGBA1C 6.5 (A) 06/20/2020    Need for immunization against influenza -     Flu Vaccine QUAD 6+ mos PF IM (Fluarix Quad PF)    Patient has been counseled on age-appropriate routine health concerns for screening and prevention. These are reviewed and up-to-date. Referrals have been placed accordingly. Immunizations are up-to-date or declined.    Subjective:   Chief Complaint  Patient presents with  . Annual Exam    Pt. is here for a physical.    HPI Kyle Campos 64 y.o. male presents to office today for annual physical.  Patient has been advised to apply for financial assistance and schedule to see our financial counselor. Awaiting CT lung cancer screening.   He also had a right jaw abscess evaluated and treated a few years ago. CT soft tissue of neck revealed right submandibular mass vs. Abscess/fluid collection. It was recommended to follow up after symptoms of abscess resolved in order to evaluate for true mass vs resolution of area of concern.     Review of Systems  Constitutional: Negative for fever, malaise/fatigue and weight loss.  HENT: Negative.  Negative for nosebleeds.   Eyes: Positive for blurred vision (right eye; worsening). Negative for double vision, photophobia, pain, discharge and redness.  Respiratory: Negative.  Negative for cough and shortness of breath.   Cardiovascular: Negative.  Negative for chest pain, palpitations and leg swelling.  Gastrointestinal: Negative.  Negative for heartburn, nausea and vomiting.  Genitourinary: Negative.     Musculoskeletal: Negative.  Negative for myalgias.  Skin: Negative.   Neurological: Negative.  Negative for dizziness, focal weakness, seizures and headaches.  Endo/Heme/Allergies: Negative.   Psychiatric/Behavioral: Negative.  Negative for suicidal ideas.    Past Medical History:  Diagnosis Date  . Colon polyps 03/14/2015   Tubular adenoma x 6  . Diabetes mellitus ([place])   . Diverticulosis   . Hepatic steatosis   . Hyperlipidemia   . Hypertension   . Lung nodules   . Smoker     Past Surgical History:  Procedure Laterality Date  . APPENDECTOMY  at age 74   . colonoscopy  03/14/2015    Family History  Problem Relation Age of Onset  . Diabetes Mother   . Cancer Father        lung cancer   . Stroke Maternal Grandfather   . Colon polyps Brother   . Colon cancer Neg Hx   . Esophageal cancer Neg Hx   . Rectal cancer Neg Hx   . Stomach cancer Neg Hx     Social History Reviewed with no changes to be made today.   Outpatient Medications Prior to Visit  Medication Sig Dispense Refill  . albuterol (PROVENTIL HFA;VENTOLIN HFA) 108 (90 Base) MCG/ACT inhaler Inhale 2 puffs into the lungs every 6 (six) hours as needed for wheezing or shortness of breath. 1 Inhaler 2  . atorvastatin (LIPITOR) 40 MG tablet Take 1 tablet (40 mg total) by mouth daily. 90 tablet 1  . Blood Glucose Monitoring Suppl (TRUE  METRIX METER) w/Device KIT Use as instructed. Monitor blood glucose levels twice per day. 1 kit 0  . cyclobenzaprine (FLEXERIL) 10 MG tablet Take 1 tablet (10 mg total) by mouth 3 (three) times daily as needed for muscle spasms. 60 tablet 0  . glucose blood test strip Use as instructed. Monitor blood glucose levels twice per day. 100 each 12  . lisinopril (ZESTRIL) 10 MG tablet Take 1 tablet (10 mg total) by mouth daily. 90 tablet 1  . metFORMIN (GLUCOPHAGE) 1000 MG tablet Take 1 tablet (1,000 mg total) by mouth 2 (two) times daily with a meal. 180 tablet 3  . naproxen (NAPROSYN) 500 MG  tablet Take 1 tablet (500 mg total) by mouth 2 (two) times daily with a meal. 60 tablet 1  . TRUEplus Lancets 28G MISC Use as instructed. Monitor blood glucose levels twice per day. 100 each 3   No facility-administered medications prior to visit.    No Known Allergies     Objective:    BP 122/72 (BP Location: Left Arm, Patient Position: Sitting, Cuff Size: Normal)   Pulse 76   Temp 97.7 F (36.5 C) (Temporal)   Ht _0  (1.854 m)   Wt 215 lb (97.5 kg)   SpO2 96%   BMI 28.37 kg/m  Wt Readings from Last 3 Encounters:  06/20/20 215 lb (97.5 kg)  01/14/20 224 lb (101.6 kg)  06/18/19 220 lb (99.8 kg)    Physical Exam Constitutional:      Appearance: He is well-developed.  HENT:     Head: Normocephalic and atraumatic.     Right Ear: Hearing, tympanic membrane, ear canal and external ear normal.     Left Ear: Hearing, tympanic membrane, ear canal and external ear normal.     Nose: Nose normal. No mucosal edema or rhinorrhea.     Mouth/Throat:     Pharynx: Uvula midline.     Tonsils: No tonsillar exudate. 1+ on the right. 1+ on the left.  Eyes:     General: Lids are normal. No scleral icterus.    Conjunctiva/sclera: Conjunctivae normal.     Pupils: Pupils are equal, round, and reactive to light.     Funduscopic exam:    Right eye: No hemorrhage.        Left eye: No hemorrhage.  Neck:     Thyroid: No thyromegaly.     Trachea: No tracheal deviation.  Cardiovascular:     Rate and Rhythm: Normal rate and regular rhythm.     Heart sounds: Normal heart sounds. No murmur heard.  No friction rub. No gallop.   Pulmonary:     Effort: Pulmonary effort is normal. No respiratory distress.     Breath sounds: Normal breath sounds. No wheezing or rales.  Chest:     Chest wall: No mass or tenderness.     Breasts:        Right: No inverted nipple, mass, nipple discharge, skin change or tenderness.        Left: No inverted nipple, mass, nipple discharge, skin change or tenderness.    Abdominal:     General: Bowel sounds are normal. There is no distension.     Palpations: Abdomen is soft. There is no mass.     Tenderness: There is no abdominal tenderness. There is no guarding or rebound.     Hernia: There is no hernia in the left inguinal area.  Genitourinary:    Penis: Normal.      Testes: Normal.  Right: Mass, tenderness or swelling not present. Right testis is descended. Cremasteric reflex is present.         Left: Mass, tenderness or swelling not present. Left testis is descended. Cremasteric reflex is present.   Musculoskeletal:        General: No tenderness or deformity. Normal range of motion.     Cervical back: Normal range of motion and neck supple.  Lymphadenopathy:     Cervical: No cervical adenopathy.     Lower Body: No right inguinal adenopathy. No left inguinal adenopathy.  Skin:    General: Skin is warm and dry.     Capillary Refill: Capillary refill takes less than 2 seconds.     Findings: No erythema.  Neurological:     Mental Status: He is alert and oriented to person, place, and time.     Cranial Nerves: No cranial nerve deficit.     Motor: No abnormal muscle tone.     Coordination: Coordination normal.     Deep Tendon Reflexes: Reflexes normal.  Psychiatric:        Behavior: Behavior normal.        Thought Content: Thought content normal.        Judgment: Judgment normal.          Patient has been counseled extensively about nutrition and exercise as well as the importance of adherence with medications and regular follow-up. The patient was given clear instructions to go to ER or return to medical center if symptoms don't improve, worsen or new problems develop. The patient verbalized understanding.   Follow-up: Return in about 3 months (around 09/19/2020).   Gildardo Pounds, FNP-BC [facility] and [place], [place]   06/20/2020, 10:04 PM

## 2020-06-21 LAB — BASIC METABOLIC PANEL
BUN/Creatinine Ratio: 19 (ref 10–24)
BUN: 17 mg/dL (ref 8–27)
CO2: 24 mmol/L (ref 20–29)
Calcium: 10.1 mg/dL (ref 8.6–10.2)
Chloride: 101 mmol/L (ref 96–106)
Creatinine, Ser: 0.88 mg/dL (ref 0.76–1.27)
GFR calc Af Amer: 107 mL/min/{1.73_m2} (ref >59)
GFR calc non Af Amer: 93 mL/min/{1.73_m2} (ref >59)
Glucose: 130 mg/dL — ABNORMAL HIGH (ref 65–99)
Potassium: 4.5 mmol/L (ref 3.5–5.2)
Sodium: 140 mmol/L (ref 134–144)

## 2020-06-21 LAB — MICROALBUMIN / CREATININE URINE RATIO
Creatinine, Urine: 151.2 mg/dL
Microalb/Creat Ratio: 30 mg/g creat — ABNORMAL HIGH (ref 0–29)
Microalbumin, Urine: 46.1 ug/mL

## 2020-07-25 MED FILL — METFORMIN HCL 1000 MG TABS: 1000 | 30 days supply | Qty: 60 | Fill #1

## 2020-07-25 MED FILL — NAPROXEN 500 MG TABLET: 500 | 30 days supply | Qty: 60 | Fill #1

## 2020-07-25 MED FILL — ATORVASTATIN CALCIUM 40 MG: 40 | 30 days supply | Qty: 30 | Fill #4

## 2020-07-25 MED FILL — LISINOPRIL 10 MG TABS: 10 | 30 days supply | Qty: 30 | Fill #4

## 2020-09-19 ENCOUNTER — Other Ambulatory Visit: Payer: Self-pay

## 2020-09-19 ENCOUNTER — Other Ambulatory Visit: Payer: Self-pay | Admitting: Nurse Practitioner

## 2020-09-19 ENCOUNTER — Encounter: Payer: Self-pay | Admitting: Nurse Practitioner

## 2020-09-19 ENCOUNTER — Ambulatory Visit: Payer: Self-pay | Attending: Nurse Practitioner | Admitting: Nurse Practitioner

## 2020-09-19 DIAGNOSIS — I10 Essential (primary) hypertension: Secondary | ICD-10-CM

## 2020-09-19 DIAGNOSIS — M542 Cervicalgia: Secondary | ICD-10-CM

## 2020-09-19 DIAGNOSIS — E119 Type 2 diabetes mellitus without complications: Secondary | ICD-10-CM

## 2020-09-19 MED ORDER — NAPROXEN 500 MG PO TABS: 500.0000 mg | ORAL_TABLET | Freq: Two times a day (BID) | ORAL | 1 refills | Status: DC

## 2020-09-19 MED ORDER — LISINOPRIL 10 MG PO TABS: 10.0000 mg | ORAL_TABLET | Freq: Every day | ORAL | 1 refills | Status: DC

## 2020-09-19 MED ORDER — CYCLOBENZAPRINE HCL 10 MG PO TABS: 10.0000 mg | ORAL_TABLET | Freq: Three times a day (TID) | ORAL | 0 refills | Status: DC | PRN

## 2020-09-19 MED ORDER — METFORMIN HCL 1000 MG PO TABS: 1000.0000 mg | ORAL_TABLET | Freq: Two times a day (BID) | ORAL | 3 refills | Status: DC

## 2020-09-19 MED FILL — LISINOPRIL 10 MG TABS: 10 | 30 days supply | Qty: 30 | Fill #0

## 2020-09-19 MED FILL — CYCLOBENZAPRINE 10 MG TAB: 10 | 20 days supply | Qty: 60 | Fill #0

## 2020-09-19 MED FILL — METFORMIN HCL 1000 MG TABS: 1000 | 30 days supply | Qty: 60 | Fill #0

## 2020-09-19 MED FILL — NAPROXEN 500 MG TABLET: 500 | 30 days supply | Qty: 60 | Fill #0

## 2020-09-19 NOTE — Progress Notes (Signed)
Virtual Visit via Telephone Note Due to national recommendations of social distancing due to [place] 19, telehealth visit is felt to be most appropriate for this patient at this time.  I discussed the limitations, risks, security and privacy concerns of performing an evaluation and management service by telephone and the availability of in person appointments. I also discussed with the patient that t x-ray via here may be a patient responsible charge related to this service. The patient expressed understanding and agreed to proceed.    I connected with Kyle Campos on 09/19/20  at   8:30 AM EST  EDT by telephone and verified that I am speaking with the correct person using two identifiers.   Consent I discussed the limitations, risks, security and privacy concerns of performing an evaluation and management service by telephone and the availability of in person appointments. I also discussed with the patient that there may be a patient responsible charge related to this service. The patient expressed understanding all day okay RI agreed to proceed.   Location of Patient: Private Residence   Location of Provider: [place] [facility] [employer] Office    Persons participating in Telemedicine visit: Kyle Rankins FNP-BC [place]    History of Present Illness: Telemedicine visit for: Follow-up  has a past medical history of Colon polyps (03/14/2015), Diabetes mellitus ([place]), Diverticulosis, Hepatic steatosis, Hyperlipidemia, Hypertension, Lung nodules, and Smoker.  Essential Hypertension Well-controlled.  Denies chest pain, shortness of breath, palpitations, lightheadedness, dizziness, headaches or BLE edema. He continues to smoke 1 pack of cigarettes per day. Taking lisinopril 10 mg daily as prescribed.  He does not routinely monitor his blood pressure at home. BP Readings from Last 3 Encounters:  06/20/20 122/72  01/14/20 128/84  06/18/19 124/88   DM2   Well-controlled with Metformin 1000 mg twice daily.  He does not routinely monitor his blood glucose levels at home.  Denies any symptoms of hypo or hyperglycemia at this time. Lab Results  Component Value Date   HGBA1C 6.5 (A) 06/20/2020   We are still waiting for him to have his MRI of the neck performed as well as CT of the chest.  He is aware that we are waiting to schedule him however he states at this time he is apprehensive about going into the hospital for any testing due to Covid 19 and would like to postpone at this time.     Past Medical History:  Diagnosis Date  . Colon polyps 03/14/2015   Tubular adenoma x 6  . Diabetes mellitus ([place])   . Diverticulosis   . Hepatic steatosis   . Hyperlipidemia   . Hypertension   . Lung nodules   . Smoker     Past Surgical History:  Procedure Laterality Date  . APPENDECTOMY  at age 55   . colonoscopy  03/14/2015    Family History  Problem Relation Age of Onset  . Diabetes Mother   . Cancer Father        lung cancer   . Stroke Maternal Grandfather   . Colon polyps Brother   . Colon cancer Neg Hx   . Esophageal cancer Neg Hx   . Rectal cancer Neg Hx   . Stomach cancer Neg Hx     Social History   Socioeconomic History  . Marital status: Single    Spouse name: Not on file  . Number of children: Not on file  . Years of education: Not on file  .  Highest education level: Not on file  Occupational History  . Not on file  Tobacco Use  . Smoking status: Current Every Day Smoker    Packs/day: 1.00    Years: 40.00    Pack years: 40.00    Types: Cigarettes  . Smokeless tobacco: Never Used  Vaping Use  . Vaping Use: Never used  Substance and Sexual Activity  . Alcohol use: Not Currently    Alcohol/week: 0.0 standard drinks    Comment: once monthly  . Drug use: No  . Sexual activity: Not on file  Other Topics Concern  . Not on file  Social History Narrative  . Not on file   Social Determinants of Health    Financial Resource Strain: Not on file  Food Insecurity: Not on file  Transportation Needs: Not on file  Physical Activity: Not on file  Stress: Not on file  Social Connections: Not on file     Observations/Objective: Awake, alert and oriented x 3   Review of Systems  Constitutional: Negative for fever, malaise/fatigue and weight loss.  HENT: Negative.  Negative for nosebleeds.   Eyes: Negative.  Negative for blurred vision, double vision and photophobia.  Respiratory: Negative.  Negative for cough and shortness of breath.   Cardiovascular: Negative.  Negative for chest pain, palpitations and leg swelling.  Gastrointestinal: Negative.  Negative for heartburn, nausea and vomiting.  Musculoskeletal: Positive for back pain, joint pain and neck pain. Negative for myalgias.  Neurological: Negative.  Negative for dizziness, focal weakness, seizures and headaches.  Psychiatric/Behavioral: Negative.  Negative for suicidal ideas.    Assessment and Plan: [name] was seen today for follow-up.  Diagnoses and all orders for this visit:  Essential hypertension -     lisinopril (ZESTRIL) 10 MG tablet; Take 1 tablet (10 mg total) by mouth daily. Continue all antihypertensives as prescribed.  Remember to bring in your blood pressure log with you for your follow up appointment.  DASH/Mediterranean Diets are healthier choices for HTN.   Controlled type 2 diabetes mellitus without complication, without long-term current use of insulin (HCC) -     metFORMIN (GLUCOPHAGE) 1000 MG tablet; Take 1 tablet (1,000 mg total) by mouth 2 (two) times daily with a meal. Continue blood sugar control as discussed in office today, low carbohydrate diet, and regular physical exercise as tolerated, 150 minutes per week (30 min each day, 5 days per week, or 50 min 3 days per week). Keep blood sugar logs with fasting goal of 90-130 mg/dl, post prandial (after you eat) less than 180.  For Hypoglycemia: BS <60 and  Hyperglycemia BS >400; contact the clinic ASAP. Annual eye exams and foot exams are recommended.  Cervical pain (neck) -     cyclobenzaprine (FLEXERIL) 10 MG tablet; Take 1 tablet (10 mg total) by mouth 3 (three) times daily as needed for muscle spasms. -     naproxen (NAPROSYN) 500 MG tablet; Take 1 tablet (500 mg total) by mouth 2 (two) times daily with a meal.     Follow Up Instructions Return in about 3 months (around 12/18/2020) for In office.     I discussed the assessment and treatment plan with the patient. The patient was provided an opportunity to ask questions and all were answered. The patient agreed with the plan and demonstrated an understanding of the instructions.   The patient was advised to call back or seek an in-person evaluation if the symptoms worsen or if the condition fails to improve as  anticipated.  I provided 14 minutes of non-face-to-face time during this encounter including median intraservice time, reviewing previous notes, labs, imaging, medications and explaining diagnosis and management.  Gildardo Pounds, FNP-BC

## 2020-10-01 MED FILL — NAPROXEN 500 MG TABLET: 500 | 30 days supply | Qty: 60 | Fill #0

## 2020-10-01 MED FILL — METFORMIN HCL 1000 MG TABS: 1000 | 30 days supply | Qty: 60 | Fill #2

## 2020-10-01 MED FILL — ATORVASTATIN CALCIUM 40 MG: 40 | 30 days supply | Qty: 30 | Fill #5

## 2020-10-01 MED FILL — LISINOPRIL 10 MG TABS: 10 | 30 days supply | Qty: 30 | Fill #5

## 2020-11-24 ENCOUNTER — Other Ambulatory Visit: Payer: Self-pay | Admitting: Nurse Practitioner

## 2020-11-24 DIAGNOSIS — E782 Mixed hyperlipidemia: Secondary | ICD-10-CM

## 2020-11-24 MED FILL — ATORVASTATIN CALCIUM 40 MG: 40 | 30 days supply | Qty: 30 | Fill #0

## 2020-11-24 MED FILL — NAPROXEN 500 MG TABLET: 500 | 30 days supply | Qty: 60 | Fill #1

## 2020-11-24 MED FILL — METFORMIN HCL 1000 MG TABS: 1000 | 30 days supply | Qty: 60 | Fill #3

## 2020-11-24 MED FILL — LISINOPRIL 10 MG TABS: 10 | 30 days supply | Qty: 30 | Fill #0

## 2020-12-19 ENCOUNTER — Encounter: Payer: Self-pay | Admitting: Nurse Practitioner

## 2020-12-19 ENCOUNTER — Other Ambulatory Visit: Payer: Self-pay

## 2020-12-19 ENCOUNTER — Ambulatory Visit: Payer: Self-pay | Attending: Nurse Practitioner | Admitting: Nurse Practitioner

## 2020-12-19 VITALS — BP 127/81 | HR 76 | Resp 16 | Wt 218.0 lb

## 2020-12-19 DIAGNOSIS — R9389 Abnormal findings on diagnostic imaging of other specified body structures: Secondary | ICD-10-CM

## 2020-12-19 DIAGNOSIS — I10 Essential (primary) hypertension: Secondary | ICD-10-CM

## 2020-12-19 DIAGNOSIS — E785 Hyperlipidemia, unspecified: Secondary | ICD-10-CM

## 2020-12-19 DIAGNOSIS — F17200 Nicotine dependence, unspecified, uncomplicated: Secondary | ICD-10-CM

## 2020-12-19 DIAGNOSIS — E119 Type 2 diabetes mellitus without complications: Secondary | ICD-10-CM

## 2020-12-19 LAB — POCT GLYCOSYLATED HEMOGLOBIN (HGB A1C): HbA1c, POC (controlled diabetic range): 9.1 % — AB (ref 0.0–7.0)

## 2020-12-19 LAB — GLUCOSE, POCT (MANUAL RESULT ENTRY): POC Glucose: 320 mg/dl — AB (ref 70–99)

## 2020-12-19 MED ORDER — ALBUTEROL SULFATE HFA 108 (90 BASE) MCG/ACT IN AERS
2.0000 | INHALATION_SPRAY | Freq: Four times a day (QID) | RESPIRATORY_TRACT | 2 refills | Status: DC | PRN
Start: 2020-12-19 — End: 2022-07-01

## 2020-12-19 MED ORDER — GLIMEPIRIDE 4 MG PO TABS: 4.0000 mg | ORAL_TABLET | Freq: Every day | ORAL | 1 refills | Status: DC

## 2020-12-19 NOTE — Progress Notes (Signed)
Assessment & Plan:  Kyle Campos was seen today for diabetes and hypertension.  Diagnoses and all orders for this visit:  Controlled type 2 diabetes mellitus without complication, without long-term current use of insulin (HCC) -     POCT glucose (manual entry) -     POCT glycosylated hemoglobin (Hb A1C) -     glimepiride (AMARYL) 4 MG tablet; Take 1 tablet (4 mg total) by mouth daily before breakfast. Continue blood sugar control as discussed in office today, low carbohydrate diet, and regular physical exercise as tolerated, 150 minutes per week (30 min each day, 5 days per week, or 50 min 3 days per week). Keep blood sugar logs with fasting goal of 90-130 mg/dl, post prandial (after you eat) less than 180.  For Hypoglycemia: BS <60 and Hyperglycemia BS >400; contact the clinic ASAP. Annual eye exams and foot exams are recommended.  Abnormal MRI, neck -     Cancel: MR NECK W/CM; Future  Tobacco dependence -     albuterol (VENTOLIN HFA) 108 (90 Base) MCG/ACT inhaler; Inhale 2 puffs into the lungs every 6 (six) hours as needed for wheezing or shortness of breath. NEEDS PASS -     CBC  Essential hypertension -     CMP14+EGFR Continue all antihypertensives as prescribed.  Remember to bring in your blood pressure log with you for your follow up appointment.  DASH/Mediterranean Diets are healthier choices for HTN.    Dyslipidemia, goal LDL below 70 -     Lipid panel INSTRUCTIONS: Work on a low fat, heart healthy diet and participate in regular aerobic exercise program by working out at least 150 minutes per week; 5 days a week-30 minutes per day. Avoid red meat/beef/steak,  fried foods. junk foods, sodas, sugary drinks, unhealthy snacking, alcohol and smoking.  Drink at least 80 oz of water per day and monitor your carbohydrate intake daily.      Patient has been counseled on age-appropriate routine health concerns for screening and prevention. These are reviewed and up-to-date. Referrals  have been placed accordingly. Immunizations are up-to-date or declined.    Subjective:   Chief Complaint  Patient presents with  . Diabetes  . Hypertension   Kyle Campos 65 y.o. male presents to office today for follow up.  He has a past medical history of Colon polyps (03/14/2015), DM2, Diverticulosis, Hepatic steatosis, Hyperlipidemia, Hypertension, Lung nodules, and Smoker.   Needs Lung CT screening:Began smoking (44years ago) Currently smokes 0.5-.75 packs of cigarettes per day.    DM 2 Diabetes poorly controlled. He has not been adhering to a diet that is low in carbohydrates. Taking Metformin 1000 mg twice daily.  We will add glimepiride 4 mg daily today.  I have also instructed him to start checking his blood glucose levels daily as he has not been making this part of his routine.  LDL at goal with atorvastatin 40 mg daily.  Blood pressure at goal with lisinopril 10 mg daily. Denies chest pain, shortness of breath, palpitations, lightheadedness, dizziness, headaches or BLE edema.  Lab Results  Component Value Date   HGBA1C 9.1 (A) 12/19/2020   Lab Results  Component Value Date   LDLCALC 55 01/14/2020   BP Readings from Last 3 Encounters:  12/19/20 127/81  06/20/20 122/72  01/14/20 128/84    Needs MRI of neck due to abnormal CT 08-2017 1. 3 x 2 mm abscess RIGHT submandibular soft tissues with RIGHT lower face and upper neck cellulitis. Mass abuts the  RIGHT submandibular gland though appears separate.    Review of Systems  Constitutional: Negative for fever, malaise/fatigue and weight loss.  HENT: Negative.  Negative for nosebleeds.   Eyes: Negative.  Negative for blurred vision, double vision and photophobia.  Respiratory: Negative.  Negative for cough and shortness of breath.   Cardiovascular: Negative.  Negative for chest pain, palpitations and leg swelling.  Gastrointestinal: Negative.  Negative for heartburn, nausea and vomiting.  Musculoskeletal:  Negative.  Negative for myalgias.  Neurological: Negative.  Negative for dizziness, focal weakness, seizures and headaches.  Psychiatric/Behavioral: Negative.  Negative for suicidal ideas.    Past Medical History:  Diagnosis Date  . Colon polyps 03/14/2015   Tubular adenoma x 6  . Diabetes mellitus ([place])   . Diverticulosis   . Hepatic steatosis   . Hyperlipidemia   . Hypertension   . Lung nodules   . Smoker     Past Surgical History:  Procedure Laterality Date  . APPENDECTOMY  at age 45   . colonoscopy  03/14/2015    Family History  Problem Relation Age of Onset  . Diabetes Mother   . Cancer Father        lung cancer   . Stroke Maternal Grandfather   . Colon polyps Brother   . Colon cancer Neg Hx   . Esophageal cancer Neg Hx   . Rectal cancer Neg Hx   . Stomach cancer Neg Hx     Social History Reviewed with no changes to be made today.   Outpatient Medications Prior to Visit  Medication Sig Dispense Refill  . atorvastatin (LIPITOR) 40 MG tablet TAKE 1 TABLET (40 MG TOTAL) BY MOUTH DAILY. 90 tablet 0  . Blood Glucose Monitoring Suppl (TRUE METRIX METER) w/Device KIT Use as instructed. Monitor blood glucose levels twice per day. 1 kit 0  . cyclobenzaprine (FLEXERIL) 10 MG tablet Take 1 tablet (10 mg total) by mouth 3 (three) times daily as needed for muscle spasms. 60 tablet 0  . glucose blood test strip Use as instructed. Monitor blood glucose levels twice per day. 100 each 12  . lisinopril (ZESTRIL) 10 MG tablet Take 1 tablet (10 mg total) by mouth daily. 90 tablet 1  . metFORMIN (GLUCOPHAGE) 1000 MG tablet Take 1 tablet (1,000 mg total) by mouth 2 (two) times daily with a meal. 180 tablet 3  . naproxen (NAPROSYN) 500 MG tablet Take 1 tablet (500 mg total) by mouth 2 (two) times daily with a meal. 60 tablet 1  . TRUEplus Lancets 28G MISC Use as instructed. Monitor blood glucose levels twice per day. 100 each 3  . albuterol (PROVENTIL HFA;VENTOLIN HFA) 108 (90 Base)  MCG/ACT inhaler Inhale 2 puffs into the lungs every 6 (six) hours as needed for wheezing or shortness of breath. 1 Inhaler 2   No facility-administered medications prior to visit.    No Known Allergies     Objective:    BP 127/81   Pulse 76   Resp 16   Wt 218 lb (98.9 kg)   SpO2 95%   BMI 28.76 kg/m  Wt Readings from Last 3 Encounters:  12/19/20 218 lb (98.9 kg)  06/20/20 215 lb (97.5 kg)  01/14/20 224 lb (101.6 kg)    Physical Exam Vitals and nursing note reviewed.  Constitutional:      Appearance: He is well-developed.  HENT:     Head: Normocephalic and atraumatic.  Cardiovascular:     Rate and Rhythm: Normal rate and regular  rhythm.     Heart sounds: Normal heart sounds. No murmur heard. No friction rub. No gallop.   Pulmonary:     Effort: Pulmonary effort is normal. No tachypnea or respiratory distress.     Breath sounds: Normal breath sounds. No decreased breath sounds, wheezing, rhonchi or rales.  Chest:     Chest wall: No tenderness.  Abdominal:     General: Bowel sounds are normal.     Palpations: Abdomen is soft.  Musculoskeletal:        General: Normal range of motion.     Cervical back: Normal range of motion.  Skin:    General: Skin is warm and dry.  Neurological:     Mental Status: He is alert and oriented to person, place, and time.     Coordination: Coordination normal.  Psychiatric:        Behavior: Behavior normal. Behavior is cooperative.        Thought Content: Thought content normal.        Judgment: Judgment normal.        Patient has been counseled extensively about nutrition and exercise as well as the importance of adherence with medications and regular follow-up. The patient was given clear instructions to go to ER or return to medical center if symptoms don't improve, worsen or new problems develop. The patient verbalized understanding.   Follow-up: Return for 4 weeks [place]. See me in 3 months. NEEDS Financial Aid  application.   Gildardo Pounds, FNP-BC [facility] and [place], [place]   12/19/2020, 12:15 PM

## 2020-12-20 LAB — CBC
Hematocrit: 44.5 % (ref 37.5–51.0)
Hemoglobin: 15 g/dL (ref 13.0–17.7)
MCH: 30.1 pg (ref 26.6–33.0)
MCHC: 33.7 g/dL (ref 31.5–35.7)
MCV: 89 fL (ref 79–97)
Platelets: 253 10*3/uL (ref 150–450)
RBC: 4.99 x10E6/uL (ref 4.14–5.80)
RDW: 14.1 % (ref 11.6–15.4)
WBC: 5.9 10*3/uL (ref 3.4–10.8)

## 2020-12-20 LAB — CMP14+EGFR
ALT: 26 IU/L (ref 0–44)
AST: 23 IU/L (ref 0–40)
Albumin/Globulin Ratio: 1.7 (ref 1.2–2.2)
Albumin: 4.7 g/dL (ref 3.8–4.8)
Alkaline Phosphatase: 68 IU/L (ref 44–121)
BUN/Creatinine Ratio: 24 (ref 10–24)
BUN: 21 mg/dL (ref 8–27)
Bilirubin Total: 0.5 mg/dL (ref 0.0–1.2)
CO2: 20 mmol/L (ref 20–29)
Calcium: 9.5 mg/dL (ref 8.6–10.2)
Chloride: 99 mmol/L (ref 96–106)
Creatinine, Ser: 0.88 mg/dL (ref 0.76–1.27)
Globulin, Total: 2.7 g/dL (ref 1.5–4.5)
Glucose: 279 mg/dL — ABNORMAL HIGH (ref 65–99)
Potassium: 4.7 mmol/L (ref 3.5–5.2)
Sodium: 137 mmol/L (ref 134–144)
Total Protein: 7.4 g/dL (ref 6.0–8.5)
eGFR: 97 mL/min/{1.73_m2} (ref >59)

## 2020-12-20 LAB — LIPID PANEL
Chol/HDL Ratio: 4.8 ratio (ref 0.0–5.0)
Cholesterol, Total: 159 mg/dL (ref 100–199)
HDL: 33 mg/dL — ABNORMAL LOW (ref >39)
LDL Chol Calc (NIH): 82 mg/dL (ref 0–99)
Triglycerides: 265 mg/dL — ABNORMAL HIGH (ref 0–149)
VLDL Cholesterol Cal: 44 mg/dL — ABNORMAL HIGH (ref 5–40)

## 2020-12-24 ENCOUNTER — Ambulatory Visit (HOSPITAL_BASED_OUTPATIENT_CLINIC_OR_DEPARTMENT_OTHER)
Admission: RE | Admit: 2020-12-24 | Discharge: 2020-12-24 | Disposition: A | Discharge status: Home / Self Care | Payer: Self-pay | Source: Ambulatory Visit | Attending: Nurse Practitioner | Admitting: Nurse Practitioner

## 2020-12-24 ENCOUNTER — Other Ambulatory Visit: Payer: Self-pay

## 2020-12-24 DIAGNOSIS — I2510 Atherosclerotic heart disease of native coronary artery without angina pectoris: Secondary | ICD-10-CM | POA: Insufficient documentation

## 2020-12-24 DIAGNOSIS — K760 Fatty (change of) liver, not elsewhere classified: Secondary | ICD-10-CM | POA: Insufficient documentation

## 2020-12-24 DIAGNOSIS — Z122 Encounter for screening for malignant neoplasm of respiratory organs: Secondary | ICD-10-CM | POA: Insufficient documentation

## 2020-12-24 DIAGNOSIS — I700 Atherosclerosis of aorta: Secondary | ICD-10-CM | POA: Insufficient documentation

## 2020-12-24 DIAGNOSIS — F17200 Nicotine dependence, unspecified, uncomplicated: Secondary | ICD-10-CM | POA: Insufficient documentation

## 2020-12-24 DIAGNOSIS — J432 Centrilobular emphysema: Secondary | ICD-10-CM | POA: Insufficient documentation

## 2020-12-29 MED FILL — !VENTOLIN HFA INHALER: 108 (90 BAS | 25 days supply | Qty: 18 | Fill #0

## 2020-12-29 MED FILL — GLIMEPIRIDE 4 MG TABLET: 4 | 30 days supply | Qty: 30 | Fill #0

## 2021-01-01 ENCOUNTER — Other Ambulatory Visit: Payer: Self-pay

## 2021-01-01 ENCOUNTER — Ambulatory Visit (HOSPITAL_COMMUNITY): Admission: RE | Admit: 2021-01-01 | Payer: Self-pay | Source: Ambulatory Visit

## 2021-01-01 ENCOUNTER — Ambulatory Visit (HOSPITAL_COMMUNITY)
Admission: RE | Admit: 2021-01-01 | Discharge: 2021-01-01 | Disposition: A | Discharge status: Home / Self Care | Payer: Self-pay | Source: Ambulatory Visit | Attending: Nurse Practitioner | Admitting: Nurse Practitioner

## 2021-01-01 DIAGNOSIS — R9389 Abnormal findings on diagnostic imaging of other specified body structures: Secondary | ICD-10-CM | POA: Insufficient documentation

## 2021-01-01 MED ORDER — GADOBUTROL 1 MMOL/ML IV SOLN
7.5000 mL | Freq: Once | INTRAVENOUS | Status: AC | PRN
  Administered 2021-01-01: 7.5 mL via INTRAVENOUS

## 2021-01-03 ENCOUNTER — Other Ambulatory Visit: Payer: Self-pay

## 2021-01-15 ENCOUNTER — Other Ambulatory Visit: Payer: Self-pay | Admitting: Nurse Practitioner

## 2021-01-15 ENCOUNTER — Other Ambulatory Visit: Payer: Self-pay

## 2021-01-15 DIAGNOSIS — M542 Cervicalgia: Secondary | ICD-10-CM

## 2021-01-15 DIAGNOSIS — E119 Type 2 diabetes mellitus without complications: Secondary | ICD-10-CM

## 2021-01-15 MED ORDER — METFORMIN HCL 1000 MG PO TABS
ORAL_TABLET | Freq: Two times a day (BID) | ORAL | 0 refills | Status: DC
  Filled 2021-01-15: qty 60, 30d supply, fill #0

## 2021-01-15 MED ORDER — NAPROXEN 500 MG PO TABS
ORAL_TABLET | Freq: Two times a day (BID) | ORAL | 1 refills | Status: DC
  Filled 2021-01-15: qty 60, 30d supply, fill #0
  Filled 2021-05-01: qty 60, 30d supply, fill #1

## 2021-01-15 MED FILL — Lisinopril Tab 10 MG: ORAL | 30 days supply | Qty: 30 | Fill #0 | Status: AC

## 2021-01-15 MED FILL — Atorvastatin Calcium Tab 40 MG (Base Equivalent): ORAL | 30 days supply | Qty: 30 | Fill #0 | Status: AC

## 2021-01-15 NOTE — Telephone Encounter (Signed)
A1C abnormal, provider reviewed, follow up appt for 4/19.

## 2021-01-20 ENCOUNTER — Other Ambulatory Visit: Payer: Self-pay

## 2021-01-20 ENCOUNTER — Ambulatory Visit: Payer: Self-pay | Attending: Nurse Practitioner | Admitting: Pharmacist

## 2021-01-20 ENCOUNTER — Encounter: Payer: Self-pay | Admitting: Pharmacist

## 2021-01-20 DIAGNOSIS — E1165 Type 2 diabetes mellitus with hyperglycemia: Secondary | ICD-10-CM

## 2021-01-20 DIAGNOSIS — IMO0002 Reserved for concepts with insufficient information to code with codable children: Secondary | ICD-10-CM

## 2021-01-20 LAB — GLUCOSE, POCT (MANUAL RESULT ENTRY): POC Glucose: 268 mg/dl — AB (ref 70–99)

## 2021-01-20 MED ORDER — TRUE METRIX METER W/DEVICE KIT
PACK | 0 refills | Status: AC
  Filled 2021-01-20: qty 1, 1d supply, fill #0
  Filled 2021-01-20: qty 1, 365d supply, fill #0

## 2021-01-20 MED ORDER — TRUE METRIX BLOOD GLUCOSE TEST VI STRP
ORAL_STRIP | 2 refills | Status: AC
  Filled 2021-01-20: qty 100, 50d supply, fill #0

## 2021-01-20 MED ORDER — TRUEPLUS LANCETS 28G MISC
2 refills | Status: AC
  Filled 2021-01-20: qty 100, 50d supply, fill #0

## 2021-01-20 NOTE — Progress Notes (Signed)
S:    PCP: Zelda   No chief complaint on file.  Patient arrives in good spirits. Presents for diabetes evaluation, education, and management Patient was referred and last seen by Primary Care Provider on 12/19/2020.    Patient reports diabetes was diagnosed 2-3 years ago. Has only ever used metformin, glimepiride at home. He denies hx of pancreatitis. Has maintained good control with A1c <7 since 03/2019 until his result came back at 9.1% on 12/23/20. No hx of ACS/CAD, CHF, CKD.   Family/Social History:  FHx: stroke, DM - no family hx of thyroid cancer Tobacco: current 0.5 PPD smoker  Alcohol: denies regular use   Insurance coverage/medication affordability: self pay  Medication adherence reported.   Current diabetes medications include: metformin 1000 mg BID, glimepiride 4 mg daily  Current hypertension medications include: lisinopril 10 mg daily  Current hyperlipidemia medications include: atorvastatin 40 mg daily  Patient denies hypoglycemic events.  Patient reported dietary habits:  - Admits to recent dietary indiscretion and verbalizes understanding that this led to his most recent A1c result  Patient-reported exercise habits:  - None outside of work   Patient endorses some polyuria. Patient denies neuropathy (nerve pain). Patient denies visual changes. Patient reports self foot exams.     O:  POCT: 263  Lab Results  Component Value Date   HGBA1C 9.1 (A) 12/19/2020   There were no vitals filed for this visit.  Lipid Panel     Component Value Date/Time   CHOL 159 12/19/2020 0931   TRIG 265 (H) 12/19/2020 0931   HDL 33 (L) 12/19/2020 0931   CHOLHDL 4.8 12/19/2020 0931   CHOLHDL 6.0 01/02/2015 1156   VLDL 72 (H) 01/02/2015 1156   LDLCALC 82 12/19/2020 0931    Home fasting blood sugars: not checking   2 hour post-meal/random blood sugars: not checking  Clinical Atherosclerotic Cardiovascular Disease (ASCVD): No  The 10-year ASCVD risk score Mikey Bussing DC  Jr., et al., 2013) is: 33%   Values used to calculate the score:     Age: 65 years     Sex: Male     Is Non-Hispanic African American: No     Diabetic: Yes     Tobacco smoker: Yes     Systolic Blood Pressure: 938 mmHg     Is BP treated: Yes     HDL Cholesterol: 33 mg/dL     Total Cholesterol: 159 mg/dL    A/P: Diabetes longstanding currently uncontrolled secondary to dietary indiscretion. Patient is able to verbalize appropriate hypoglycemia management plan. Medication adherence appears appropriate. He is not checking CBGs at home and attributes this to misplacing his meter.   With his lack of symptoms and A1c <10, I do not believe we need insulin at this point. With his ASCVD risk, Trulicity would be a good option but patient wishes to avoid injectables for now. Finally, we could employ the use of Jardiance which could also offer CV benefit. Patient wishes to hold off on these options now as he believes he can transform his dietary habits.   -Continued metformin and glimepiride at current doses.  -True Metrix supplies sent to our pharmacy. Pt agrees to check CBGs at home at least once daily. He will return in 1 month for reassessment and modification of medical therapy if indicated.  -Extensively discussed pathophysiology of diabetes, recommended lifestyle interventions, dietary effects on blood sugar control -Counseled on s/sx of and management of hypoglycemia -Next A1C anticipated 03/2021.   ASCVD risk -  primary prevention in patient with diabetes. Last LDL is not controlled. With his DM and additional risk factors (HLD, HTN, tobacco use), I recommend a goal LDL of <70. ASCVD risk score is >20%  - high intensity statin indicated.  -Continued atorvastatin 40 mg for now.  -Consider addition of Zetia in the future if LDL is >70.   Written patient instructions provided.  Total time in face to face counseling 30 minutes.   Follow up Pharmacist Clinic Visit in 3-4 weeks.    Benard Halsted, PharmD, [place], [place] [phone]

## 2021-01-21 ENCOUNTER — Other Ambulatory Visit: Payer: Self-pay

## 2021-02-17 ENCOUNTER — Ambulatory Visit: Payer: Self-pay | Admitting: Pharmacist

## 2021-02-26 ENCOUNTER — Other Ambulatory Visit: Payer: Self-pay | Admitting: Nurse Practitioner

## 2021-02-26 DIAGNOSIS — E119 Type 2 diabetes mellitus without complications: Secondary | ICD-10-CM

## 2021-02-26 MED FILL — Lisinopril Tab 10 MG: ORAL | 30 days supply | Qty: 30 | Fill #1 | Status: AC

## 2021-02-26 MED FILL — Atorvastatin Calcium Tab 40 MG (Base Equivalent): ORAL | 30 days supply | Qty: 30 | Fill #1 | Status: AC

## 2021-02-26 NOTE — Telephone Encounter (Signed)
   Notes to clinic Has appt with Pharmacist 5/27, and MD 03/23/21, was only given one month supply. Please assess.

## 2021-02-27 ENCOUNTER — Other Ambulatory Visit: Payer: Self-pay

## 2021-02-27 ENCOUNTER — Encounter: Payer: Self-pay | Admitting: Pharmacist

## 2021-02-27 ENCOUNTER — Ambulatory Visit: Payer: Self-pay | Attending: Nurse Practitioner | Admitting: Pharmacist

## 2021-02-27 DIAGNOSIS — E1165 Type 2 diabetes mellitus with hyperglycemia: Secondary | ICD-10-CM

## 2021-02-27 DIAGNOSIS — IMO0002 Reserved for concepts with insufficient information to code with codable children: Secondary | ICD-10-CM

## 2021-02-27 LAB — GLUCOSE, POCT (MANUAL RESULT ENTRY): POC Glucose: 258 mg/dl — AB (ref 70–99)

## 2021-02-27 MED ORDER — METFORMIN HCL 1000 MG PO TABS
ORAL_TABLET | Freq: Two times a day (BID) | ORAL | 2 refills | Status: DC
  Filled 2021-02-27: qty 60, 30d supply, fill #0

## 2021-02-27 MED ORDER — TRULICITY 0.75 MG/0.5ML ~~LOC~~ SOAJ
0.7500 mg | SUBCUTANEOUS | 0 refills | Status: DC
  Filled 2021-02-27 (×2): qty 2, 28d supply, fill #0

## 2021-02-27 NOTE — Progress Notes (Signed)
    S:    PCP: Zelda   No chief complaint on file.  Patient arrives in good spirits. Presents for diabetes evaluation, education, and management Patient was referred and last seen by Primary Care Provider on 12/19/2020.  I saw him on 01/20/2021, however, pt wanted to hold off on medication changes at that visit.   Family/Social History:  FHx: stroke, DM - no family hx of thyroid cancer Tobacco: current 0.5 PPD smoker  Alcohol: denies regular use   Insurance coverage/medication affordability: self pay  Medication adherence reported Current diabetes medications include: metformin 1000 mg BID, glimepiride 4 mg daily  Current hypertension medications include: lisinopril 10 mg daily  Current hyperlipidemia medications include: atorvastatin 40 mg daily  Patient denies hypoglycemic events.  Patient reported dietary habits:  - Admits to recent dietary indiscretion and verbalizes understanding that this led to his most recent A1c result  Patient-reported exercise habits:  - None outside of work   Patient endorses some polyuria. Patient denies neuropathy (nerve pain). Patient denies visual changes. Patient reports self foot exams.     O:  POCT: 258  Lab Results  Component Value Date   HGBA1C 9.1 (A) 12/19/2020   There were no vitals filed for this visit.  Lipid Panel     Component Value Date/Time   CHOL 159 12/19/2020 0931   TRIG 265 (H) 12/19/2020 0931   HDL 33 (L) 12/19/2020 0931   CHOLHDL 4.8 12/19/2020 0931   CHOLHDL 6.0 01/02/2015 1156   VLDL 72 (H) 01/02/2015 1156   LDLCALC 82 12/19/2020 0931   Home fasting blood sugars: 226 this morning fasting. Most of the time he sees numbers in the 200s.   Clinical Atherosclerotic Cardiovascular Disease (ASCVD): No  The 10-year ASCVD risk score Mikey Bussing DC Jr., et al., 2013) is: 33%   Values used to calculate the score:     Age: 65 years     Sex: Male     Is Non-Hispanic African American: No     Diabetic: Yes     Tobacco  smoker: Yes     Systolic Blood Pressure: 559 mmHg     Is BP treated: Yes     HDL Cholesterol: 33 mg/dL     Total Cholesterol: 159 mg/dL    A/P: Diabetes longstanding currently uncontrolled. His home CBGs continue to run elevated. Patient is able to verbalize appropriate hypoglycemia management plan. Medication adherence appears appropriate. We had a risk benefit discussion regarding Trulicity and pt is amenable to starting therapy.  -Continued metformin.  -Stop glimepiride.  -Start Trulicity 7.41 mg weekly.  -Extensively discussed pathophysiology of diabetes, recommended lifestyle interventions, dietary effects on blood sugar control -Counseled on s/sx of and management of hypoglycemia -Next A1C anticipated 03/2021.   ASCVD risk - primary prevention in patient with diabetes. Last LDL is not controlled. With his DM and additional risk factors (HLD, HTN, tobacco use), I recommend a goal LDL of <70. ASCVD risk score is >20%  - high intensity statin indicated.  -Continued atorvastatin 40 mg for now.  -Consider addition of Zetia in the future if LDL is >70.   Written patient instructions provided.  Total time in face to face counseling 30 minutes.   Follow up Pharmacist Clinic Visit in 3-4 weeks.    Benard Halsted, PharmD, [place], [place] [phone]

## 2021-03-06 ENCOUNTER — Other Ambulatory Visit: Payer: Self-pay

## 2021-03-23 ENCOUNTER — Other Ambulatory Visit: Payer: Self-pay | Admitting: Family Medicine

## 2021-03-23 ENCOUNTER — Encounter: Payer: Self-pay | Admitting: Nurse Practitioner

## 2021-03-23 ENCOUNTER — Other Ambulatory Visit (HOSPITAL_COMMUNITY): Payer: Self-pay

## 2021-03-23 ENCOUNTER — Ambulatory Visit: Payer: Self-pay | Attending: Nurse Practitioner | Admitting: Nurse Practitioner

## 2021-03-23 ENCOUNTER — Other Ambulatory Visit: Payer: Self-pay

## 2021-03-23 VITALS — BP 124/77 | HR 86 | Ht 73.0 in | Wt 213.6 lb

## 2021-03-23 DIAGNOSIS — E1165 Type 2 diabetes mellitus with hyperglycemia: Secondary | ICD-10-CM

## 2021-03-23 DIAGNOSIS — I10 Essential (primary) hypertension: Secondary | ICD-10-CM

## 2021-03-23 DIAGNOSIS — E785 Hyperlipidemia, unspecified: Secondary | ICD-10-CM

## 2021-03-23 DIAGNOSIS — R5382 Chronic fatigue, unspecified: Secondary | ICD-10-CM

## 2021-03-23 LAB — POCT GLYCOSYLATED HEMOGLOBIN (HGB A1C): HbA1c, POC (controlled diabetic range): 7.8 % — AB (ref 0.0–7.0)

## 2021-03-23 LAB — GLUCOSE, POCT (MANUAL RESULT ENTRY): POC Glucose: 190 mg/dl — AB (ref 70–99)

## 2021-03-23 MED ORDER — LISINOPRIL 10 MG PO TABS
ORAL_TABLET | Freq: Every day | ORAL | 1 refills | Status: DC
  Filled 2021-03-23: qty 30, 30d supply, fill #0
  Filled 2021-05-01: qty 30, 30d supply, fill #1
  Filled 2021-06-08: qty 30, 30d supply, fill #2
  Filled 2021-07-15: qty 30, 30d supply, fill #3
  Filled 2021-08-18: qty 30, 30d supply, fill #4
  Filled 2021-09-23: qty 30, 30d supply, fill #5

## 2021-03-23 MED ORDER — ATORVASTATIN CALCIUM 40 MG PO TABS
ORAL_TABLET | Freq: Every day | ORAL | 0 refills | Status: DC
  Filled 2021-03-23: qty 30, 30d supply, fill #0
  Filled 2021-05-01: qty 30, 30d supply, fill #1
  Filled 2021-06-08: qty 30, 30d supply, fill #2

## 2021-03-23 MED ORDER — TRULICITY 0.75 MG/0.5ML ~~LOC~~ SOAJ
0.7500 mg | SUBCUTANEOUS | 0 refills | Status: DC
  Filled 2021-03-23: qty 2, 28d supply, fill #0

## 2021-03-23 MED ORDER — METFORMIN HCL 1000 MG PO TABS
ORAL_TABLET | Freq: Two times a day (BID) | ORAL | 2 refills | Status: DC
  Filled 2021-03-23: qty 60, 30d supply, fill #0
  Filled 2021-05-01: qty 60, 30d supply, fill #1
  Filled 2021-07-15: qty 60, 30d supply, fill #2

## 2021-03-23 NOTE — Progress Notes (Signed)
Assessment & Plan:  Kyle Campos was seen today for diabetes.  Diagnoses and all orders for this visit:  Type 2 diabetes mellitus with hyperglycemia, without long-term current use of insulin (HCC) -     CMP14+EGFR -     POCT glucose (manual entry) -     POCT glycosylated hemoglobin (Hb A1C) -     metFORMIN (GLUCOPHAGE) 1000 MG tablet; TAKE 1 TABLET (1,000 MG TOTAL) BY MOUTH 2 (TWO) TIMES DAILY WITH A MEAL. Continue blood sugar control as discussed in office today, low carbohydrate diet, and regular physical exercise as tolerated, 150 minutes per week (30 min each day, 5 days per week, or 50 min 3 days per week). Keep blood sugar logs with fasting goal of 90-130 mg/dl, post prandial (after you eat) less than 180.  For Hypoglycemia: BS <60 and Hyperglycemia BS >400; contact the clinic ASAP. Annual eye exams and foot exams are recommended.   Dyslipidemia, goal LDL below 70 -     atorvastatin (LIPITOR) 40 MG tablet; TAKE 1 TABLET (40 MG TOTAL) BY MOUTH DAILY. INSTRUCTIONS: Work on a low fat, heart healthy diet and participate in regular aerobic exercise program by working out at least 150 minutes per week; 5 days a week-30 minutes per day. Avoid red meat/beef/steak,  fried foods. junk foods, sodas, sugary drinks, unhealthy snacking, alcohol and smoking.  Drink at least 80 oz of water per day and monitor your carbohydrate intake daily.    Primary hypertension -     CMP14+EGFR -     lisinopril (ZESTRIL) 10 MG tablet; TAKE 1 TABLET (10 MG TOTAL) BY MOUTH DAILY. Continue all antihypertensives as prescribed.  Remember to bring in your blood pressure log with you for your follow up appointment.  DASH/Mediterranean Diets are healthier choices for HTN.    Chronic fatigue -     VITAMIN D 25 Hydroxy (Vit-D Deficiency, Fractures) -     TSH -     Testosterone   Patient has been counseled on age-appropriate routine health concerns for screening and prevention. These are reviewed and up-to-date. Referrals  have been placed accordingly. Immunizations are up-to-date or declined.    Subjective:   Chief Complaint  Patient presents with   Diabetes   HPI Kyle Campos 65 y.o. male presents to office today for follow up to DM and HTN. He has a past medical history of Colon polyps (03/14/2015), Diabetes mellitus ([place]), Diverticulosis, Hepatic steatosis, Hyperlipidemia, Hypertension, Lung nodules, and Smoker.    Fatigue Endorses increased fatigue. No energy over the past several months. Used to be very active and now gets tired easily.  Depression screen [facility] 2/9 03/23/2021 06/20/2020 01/14/2020 06/18/2019 03/13/2019  Decreased Interest 0 0 0 0 0  Down, Depressed, Hopeless 0 0 0 0 0  PHQ - 2 Score 0 0 0 0 0  Altered sleeping 3 0 0 1 1  Tired, decreased energy 3 0 0 0 0  Change in appetite 0 0 0 0 0  Feeling bad or failure about yourself  0 0 0 0 0  Trouble concentrating 0 0 0 0 0  Moving slowly or fidgety/restless 0 0 0 0 0  Suicidal thoughts 0 0 0 0 0  PHQ-9 Score 6 0 0 1 1  Some recent data might be hidden    GAD 7 : Generalized Anxiety Score 03/23/2021 06/20/2020 01/14/2020 06/18/2019  Nervous, Anxious, on Edge 0 0 0 0  Control/stop worrying 0 0 0 0  Worry too much -  different things 0 0 0 0  Trouble relaxing 0 0 0 0  Restless 0 0 0 0  Easily annoyed or irritable 0 0 0 0  Afraid - awful might happen 0 0 0 0  Total GAD 7 Score 0 0 0 0  Anxiety Difficulty - - - -      ESSENTIAL HYPERTENSION Blood pressure is well controlled with lisinopril 10 mg daily. Denies chest pain, shortness of breath, palpitations, lightheadedness, dizziness, headaches or BLE edema.   BP Readings from Last 3 Encounters:  03/23/21 124/77  12/19/20 127/81  06/20/20 122/72    DM 2 Diabetes is improving and down from 9.1-7.8.  At this time we will continue him on metformin 1000 mg twice daily and Trulicity 6.65 mg weekly.  He denies any symptoms of hypo or hyperglycemia.  Home blood glucose reading this morning was  140.  Needs to work on stricter dietary adherence in regards to carbohydrates.  Lab Results  Component Value Date   HGBA1C 7.8 (A) 03/23/2021   Lab Results  Component Value Date   HGBA1C 9.1 (A) 12/19/2020    Lab Results  Component Value Date   LDLCALC 82 12/19/2020     Review of Systems  Constitutional:  Positive for malaise/fatigue. Negative for fever and weight loss.  HENT: Negative.  Negative for nosebleeds.   Eyes: Negative.  Negative for blurred vision, double vision and photophobia.  Respiratory:  Positive for cough. Negative for shortness of breath.   Cardiovascular: Negative.  Negative for chest pain, palpitations and leg swelling.  Gastrointestinal: Negative.  Negative for heartburn, nausea and vomiting.  Musculoskeletal: Negative.  Negative for myalgias.  Neurological: Negative.  Negative for dizziness, focal weakness, seizures and headaches.  Psychiatric/Behavioral: Negative.  Negative for suicidal ideas.    Past Medical History:  Diagnosis Date   Colon polyps 03/14/2015   Tubular adenoma x 6   Diabetes mellitus (HCC)    Diverticulosis    Hepatic steatosis    Hyperlipidemia    Hypertension    Lung nodules    Smoker     Past Surgical History:  Procedure Laterality Date   APPENDECTOMY  at age 65    colonoscopy  03/14/2015    Family History  Problem Relation Age of Onset   Diabetes Mother    Cancer Father        lung cancer    Stroke Maternal Grandfather    Colon polyps Brother    Colon cancer Neg Hx    Esophageal cancer Neg Hx    Rectal cancer Neg Hx    Stomach cancer Neg Hx     Social History Reviewed with no changes to be made today.   Outpatient Medications Prior to Visit  Medication Sig Dispense Refill   albuterol (VENTOLIN HFA) 108 (90 Base) MCG/ACT inhaler Inhale 2 puffs into the lungs every 6 (six) hours as needed for wheezing or shortness of breath. NEEDS PASS 18 g 2   Blood Glucose Monitoring Suppl (TRUE METRIX METER) w/Device KIT  Monitor blood glucose levels twice per day 1 kit 0   cyclobenzaprine (FLEXERIL) 10 MG tablet Take 1 tablet (10 mg total) by mouth 3 (three) times daily as needed for muscle spasms. 60 tablet 0   Dulaglutide (TRULICITY) 9.93 TT/0.1XB SOPN Inject 0.75 mg into the skin once a week. 2 mL 0   glucose blood (TRUE METRIX BLOOD GLUCOSE TEST) test strip Monitor blood glucose levels twice per day 100 each 2   naproxen (NAPROSYN)  500 MG tablet TAKE 1 TABLET (500 MG TOTAL) BY MOUTH 2 (TWO) TIMES DAILY WITH A MEAL. 60 tablet 1   TRUEplus Lancets 28G MISC Monitor blood glucose levels twice per day 100 each 2   atorvastatin (LIPITOR) 40 MG tablet TAKE 1 TABLET (40 MG TOTAL) BY MOUTH DAILY. 90 tablet 0   lisinopril (ZESTRIL) 10 MG tablet TAKE 1 TABLET (10 MG TOTAL) BY MOUTH DAILY. 90 tablet 1   metFORMIN (GLUCOPHAGE) 1000 MG tablet TAKE 1 TABLET (1,000 MG TOTAL) BY MOUTH 2 (TWO) TIMES DAILY WITH A MEAL. 60 tablet 2   No facility-administered medications prior to visit.    No Known Allergies     Objective:    BP 124/77   Pulse 86   Ht $R'6\' 1"'vB$  (1.854 m)   Wt 213 lb 9.6 oz (96.9 kg)   SpO2 97%   BMI 28.18 kg/m  Wt Readings from Last 3 Encounters:  03/23/21 213 lb 9.6 oz (96.9 kg)  12/24/20 218 lb (98.9 kg)  12/19/20 218 lb (98.9 kg)    Physical Exam Vitals and nursing note reviewed.  Constitutional:      Appearance: He is well-developed.  HENT:     Head: Normocephalic and atraumatic.  Cardiovascular:     Rate and Rhythm: Normal rate and regular rhythm.     Heart sounds: Normal heart sounds. No murmur heard.   No friction rub. No gallop.  Pulmonary:     Effort: Pulmonary effort is normal. No tachypnea or respiratory distress.     Breath sounds: Normal breath sounds. No decreased breath sounds, wheezing, rhonchi or rales.  Chest:     Chest wall: No tenderness.  Abdominal:     General: Bowel sounds are normal.     Palpations: Abdomen is soft.  Musculoskeletal:        General: Normal range  of motion.     Cervical back: Normal range of motion.  Skin:    General: Skin is warm and dry.  Neurological:     Mental Status: He is alert and oriented to person, place, and time.     Coordination: Coordination normal.  Psychiatric:        Behavior: Behavior normal. Behavior is cooperative.        Thought Content: Thought content normal.        Judgment: Judgment normal.         Patient has been counseled extensively about nutrition and exercise as well as the importance of adherence with medications and regular follow-up. The patient was given clear instructions to go to ER or return to medical center if symptoms don't improve, worsen or new problems develop. The patient verbalized understanding.   Follow-up: Return in about 3 months (around 06/23/2021).   Gildardo Pounds, FNP-BC [facility] and [facility], [place]   03/23/2021, 9:03 AM

## 2021-03-23 NOTE — Progress Notes (Signed)
190 CBG

## 2021-03-24 LAB — CMP14+EGFR
ALT: 24 IU/L (ref 0–44)
AST: 13 IU/L (ref 0–40)
Albumin/Globulin Ratio: 1.6 (ref 1.2–2.2)
Albumin: 4.9 g/dL — ABNORMAL HIGH (ref 3.8–4.8)
Alkaline Phosphatase: 64 IU/L (ref 44–121)
BUN/Creatinine Ratio: 23 (ref 10–24)
BUN: 21 mg/dL (ref 8–27)
Bilirubin Total: 0.3 mg/dL (ref 0.0–1.2)
CO2: 25 mmol/L (ref 20–29)
Calcium: 10.5 mg/dL — ABNORMAL HIGH (ref 8.6–10.2)
Chloride: 98 mmol/L (ref 96–106)
Creatinine, Ser: 0.92 mg/dL (ref 0.76–1.27)
Globulin, Total: 3 g/dL (ref 1.5–4.5)
Glucose: 145 mg/dL — ABNORMAL HIGH (ref 65–99)
Potassium: 4.7 mmol/L (ref 3.5–5.2)
Sodium: 139 mmol/L (ref 134–144)
Total Protein: 7.9 g/dL (ref 6.0–8.5)
eGFR: 94 mL/min/{1.73_m2} (ref >59)

## 2021-03-24 LAB — VITAMIN D 25 HYDROXY (VIT D DEFICIENCY, FRACTURES): Vit D, 25-Hydroxy: 37.6 ng/mL (ref 30.0–100.0)

## 2021-03-24 LAB — TESTOSTERONE: Testosterone: 353 ng/dL (ref 264–916)

## 2021-03-24 LAB — TSH: TSH: 2.86 u[IU]/mL (ref 0.450–4.500)

## 2021-03-26 ENCOUNTER — Telehealth: Payer: Self-pay | Admitting: Nurse Practitioner

## 2021-03-26 ENCOUNTER — Other Ambulatory Visit: Payer: Self-pay

## 2021-03-26 NOTE — Telephone Encounter (Signed)
Copied from [place] [id number]. Topic: General - Other >> Mar 25, 2021  3:19 PM Leward Quan A wrote: Reason for CRM: Patient called in to inform Geryl Rankins that his Dulaglutide (TRULICITY) 0.14 DC/3.0DT SOPN was not received by the pharmacy along with all his other medications. Asking if that can be sent in please and call him at Ph# [phone]

## 2021-03-27 ENCOUNTER — Other Ambulatory Visit: Payer: Self-pay

## 2021-03-30 NOTE — Telephone Encounter (Signed)
Rx at pharmacy per Pharmacist at [facility].

## 2021-04-23 ENCOUNTER — Other Ambulatory Visit: Payer: Self-pay | Admitting: Nurse Practitioner

## 2021-04-23 MED ORDER — TRULICITY 0.75 MG/0.5ML ~~LOC~~ SOAJ
0.7500 mg | SUBCUTANEOUS | 1 refills | Status: DC
  Filled 2021-04-23: qty 2, 28d supply, fill #0
  Filled 2021-05-21: qty 2, 28d supply, fill #1

## 2021-04-24 ENCOUNTER — Other Ambulatory Visit: Payer: Self-pay

## 2021-05-01 ENCOUNTER — Other Ambulatory Visit: Payer: Self-pay

## 2021-05-04 ENCOUNTER — Other Ambulatory Visit: Payer: Self-pay

## 2021-05-21 ENCOUNTER — Other Ambulatory Visit: Payer: Self-pay

## 2021-06-09 ENCOUNTER — Other Ambulatory Visit: Payer: Self-pay

## 2021-06-23 ENCOUNTER — Other Ambulatory Visit: Payer: Self-pay

## 2021-06-23 ENCOUNTER — Ambulatory Visit: Payer: Self-pay | Attending: Nurse Practitioner | Admitting: Nurse Practitioner

## 2021-06-23 ENCOUNTER — Encounter: Payer: Self-pay | Admitting: Nurse Practitioner

## 2021-06-23 VITALS — BP 124/80 | HR 79 | Ht 73.0 in | Wt 215.2 lb

## 2021-06-23 DIAGNOSIS — E1165 Type 2 diabetes mellitus with hyperglycemia: Secondary | ICD-10-CM

## 2021-06-23 DIAGNOSIS — H539 Unspecified visual disturbance: Secondary | ICD-10-CM

## 2021-06-23 DIAGNOSIS — L309 Dermatitis, unspecified: Secondary | ICD-10-CM

## 2021-06-23 LAB — POCT GLYCOSYLATED HEMOGLOBIN (HGB A1C): Hemoglobin A1C: 6.3 % — AB (ref 4.0–5.6)

## 2021-06-23 LAB — GLUCOSE, POCT (MANUAL RESULT ENTRY): POC Glucose: 145 mg/dl — AB (ref 70–99)

## 2021-06-23 MED ORDER — TRIAMCINOLONE ACETONIDE 0.5 % EX OINT
1.0000 "application " | TOPICAL_OINTMENT | Freq: Two times a day (BID) | CUTANEOUS | 1 refills | Status: DC
  Filled 2021-06-23: qty 30, 15d supply, fill #0

## 2021-06-23 NOTE — Progress Notes (Signed)
Assessment & Plan:  Kyle Campos was seen today for hypertension.  Diagnoses and all orders for this visit:  Type 2 diabetes mellitus with hyperglycemia, without long-term current use of insulin (HCC) -     POCT glucose (manual entry) -     POCT glycosylated hemoglobin (Hb A1C) -     CMP14+EGFR Continue blood sugar control as discussed in office today, low carbohydrate diet, and regular physical exercise as tolerated, 150 minutes per week (30 min each day, 5 days per week, or 50 min 3 days per week). Keep blood sugar logs with fasting goal of 90-130 mg/dl, post prandial (after you eat) less than 180.  For Hypoglycemia: BS <60 and Hyperglycemia BS >400; contact the clinic ASAP. Annual eye exams and foot exams are recommended.   Dermatitis -     triamcinolone ointment (KENALOG) 0.5 %; Apply 1 application topically 2 (two) times daily.  Visual disturbance Follow up with ophthalmology   Patient has been counseled on age-appropriate routine health concerns for screening and prevention. These are reviewed and up-to-date. Referrals have been placed accordingly. Immunizations are up-to-date or declined.    Subjective:   Chief Complaint  Patient presents with   Hypertension   Hypertension Associated symptoms include blurred vision. Pertinent negatives include no chest pain, headaches, malaise/fatigue, palpitations or shortness of breath.    Kyle Campos 65 y.o. male presents to office today for follow up to DM and HTN.   He has a past medical history of Colon polyps (03/14/2015), Diabetes mellitus, Diverticulosis, Hepatic steatosis, Hyperlipidemia, Hypertension, Lung nodules, and Smoker.    DM 2 Well controlled with Trulicity 4.69 mg weekly, metformin 1000 mg twice daily.  LDL not quite at goal with atorvastatin 40 mg daily.  He is taking an ACE.  Denies any symptoms of hypo or hyperglycemia. Lab Results  Component Value Date   HGBA1C 6.3 (A) 06/23/2021    Lab Results  Component  Value Date   LDLCALC 82 12/19/2020     HTN Blood pressure is well controlled. He is taking lisinopril 10 mg daily.  Denies chest pain, shortness of breath, palpitations, lightheadedness, dizziness, headaches or BLE edema.   BP Readings from Last 3 Encounters:  06/23/21 124/80  03/23/21 124/77  12/19/20 127/81    VISION He was evaluated by the ophthalmologist a few months ago. Told he had a right cataract at that time. Since this his vision seems to have worsened. He is only able to see objects that are close up or immediately in front of his face. He is waiting to see if he can make payment arrangements with the eye specialist to have surgery.   SKIN He has an area on his left lower leg that is erythematous, with pruritis, scaling and increase in circumference. States it started as a small dark spot and he was evaluated by Dermatology who told him it was a bug bite. No biopsy or skin sample was performed at that time. This was 12 years ago.      Review of Systems  Constitutional:  Negative for fever, malaise/fatigue and weight loss.  HENT: Negative.  Negative for nosebleeds.   Eyes:  Positive for blurred vision. Negative for double vision, photophobia, pain, discharge and redness.  Respiratory: Negative.  Negative for cough and shortness of breath.   Cardiovascular: Negative.  Negative for chest pain, palpitations and leg swelling.  Gastrointestinal: Negative.  Negative for heartburn, nausea and vomiting.  Musculoskeletal: Negative.  Negative for myalgias.  Skin:  Positive for itching and rash.  Neurological: Negative.  Negative for dizziness, focal weakness, seizures and headaches.  Psychiatric/Behavioral: Negative.  Negative for suicidal ideas.    Past Medical History:  Diagnosis Date   Colon polyps 03/14/2015   Tubular adenoma x 6   Diabetes mellitus (HCC)    Diverticulosis    Hepatic steatosis    Hyperlipidemia    Hypertension    Lung nodules    Smoker     Past  Surgical History:  Procedure Laterality Date   APPENDECTOMY  at age 38    colonoscopy  03/14/2015    Family History  Problem Relation Age of Onset   Diabetes Mother    Cancer Father        lung cancer    Stroke Maternal Grandfather    Colon polyps Brother    Colon cancer Neg Hx    Esophageal cancer Neg Hx    Rectal cancer Neg Hx    Stomach cancer Neg Hx     Social History Reviewed with no changes to be made today.   Outpatient Medications Prior to Visit  Medication Sig Dispense Refill   albuterol (VENTOLIN HFA) 108 (90 Base) MCG/ACT inhaler Inhale 2 puffs into the lungs every 6 (six) hours as needed for wheezing or shortness of breath. NEEDS PASS 18 g 2   atorvastatin (LIPITOR) 40 MG tablet TAKE 1 TABLET (40 MG TOTAL) BY MOUTH DAILY. 90 tablet 0   Blood Glucose Monitoring Suppl (TRUE METRIX METER) w/Device KIT Monitor blood glucose levels twice per day 1 kit 0   cyclobenzaprine (FLEXERIL) 10 MG tablet Take 1 tablet (10 mg total) by mouth 3 (three) times daily as needed for muscle spasms. 60 tablet 0   Dulaglutide (TRULICITY) 1.61 WR/6.0AV SOPN Inject 0.75 mg into the skin once a week. 2 mL 1   glucose blood (TRUE METRIX BLOOD GLUCOSE TEST) test strip Monitor blood glucose levels twice per day 100 each 2   lisinopril (ZESTRIL) 10 MG tablet TAKE 1 TABLET (10 MG TOTAL) BY MOUTH DAILY. 90 tablet 1   metFORMIN (GLUCOPHAGE) 1000 MG tablet TAKE 1 TABLET (1,000 MG TOTAL) BY MOUTH 2 (TWO) TIMES DAILY WITH A MEAL. 60 tablet 2   naproxen (NAPROSYN) 500 MG tablet TAKE 1 TABLET (500 MG TOTAL) BY MOUTH 2 (TWO) TIMES DAILY WITH A MEAL. 60 tablet 1   TRUEplus Lancets 28G MISC Monitor blood glucose levels twice per day 100 each 2   No facility-administered medications prior to visit.    No Known Allergies     Objective:    BP 124/80   Pulse 79   Ht _0  (1.854 m)   Wt 215 lb 4 oz (97.6 kg)   SpO2 95%   BMI 28.40 kg/m  Wt Readings from Last 3 Encounters:  06/23/21 215 lb 4 oz (97.6  kg)  03/23/21 213 lb 9.6 oz (96.9 kg)  12/24/20 218 lb (98.9 kg)    Physical Exam Vitals and nursing note reviewed.  Constitutional:      Appearance: He is well-developed.  HENT:     Head: Normocephalic and atraumatic.  Cardiovascular:     Rate and Rhythm: Normal rate and regular rhythm.     Heart sounds: Normal heart sounds. No murmur heard.   No friction rub. No gallop.  Pulmonary:     Effort: Pulmonary effort is normal. No tachypnea or respiratory distress.     Breath sounds: Normal breath sounds. No decreased breath sounds, wheezing, rhonchi or  rales.  Chest:     Chest wall: No tenderness.  Abdominal:     General: Bowel sounds are normal.     Palpations: Abdomen is soft.  Musculoskeletal:        General: Normal range of motion.     Cervical back: Normal range of motion.  Skin:    General: Skin is warm and dry.     Findings: Rash present.     Comments: SEE PHOTO  Neurological:     Mental Status: He is alert and oriented to person, place, and time.     Coordination: Coordination normal.  Psychiatric:        Behavior: Behavior normal. Behavior is cooperative.        Thought Content: Thought content normal.        Judgment: Judgment normal.         Patient has been counseled extensively about nutrition and exercise as well as the importance of adherence with medications and regular follow-up. The patient was given clear instructions to go to ER or return to medical center if symptoms don't improve, worsen or new problems develop. The patient verbalized understanding.   Follow-up: Return in about 3 months (around 09/22/2021) for 3 weeks any tuesday in October.   Gildardo Pounds, FNP-BC [facility] and [facility] [place], [place]   06/23/2021, 9:39 AM

## 2021-06-23 NOTE — Progress Notes (Signed)
Spot on left ankle.

## 2021-06-24 ENCOUNTER — Other Ambulatory Visit: Payer: Self-pay | Admitting: Nurse Practitioner

## 2021-06-24 LAB — CMP14+EGFR
ALT: 26 IU/L (ref 0–44)
AST: 19 IU/L (ref 0–40)
Albumin/Globulin Ratio: 2.2 (ref 1.2–2.2)
Albumin: 5.1 g/dL — ABNORMAL HIGH (ref 3.8–4.8)
Alkaline Phosphatase: 56 IU/L (ref 44–121)
BUN/Creatinine Ratio: 26 — ABNORMAL HIGH (ref 10–24)
BUN: 24 mg/dL (ref 8–27)
Bilirubin Total: 0.4 mg/dL (ref 0.0–1.2)
CO2: 23 mmol/L (ref 20–29)
Calcium: 9.8 mg/dL (ref 8.6–10.2)
Chloride: 98 mmol/L (ref 96–106)
Creatinine, Ser: 0.94 mg/dL (ref 0.76–1.27)
Globulin, Total: 2.3 g/dL (ref 1.5–4.5)
Glucose: 139 mg/dL — ABNORMAL HIGH (ref 65–99)
Potassium: 4.8 mmol/L (ref 3.5–5.2)
Sodium: 137 mmol/L (ref 134–144)
Total Protein: 7.4 g/dL (ref 6.0–8.5)
eGFR: 92 mL/min/{1.73_m2} (ref >59)

## 2021-06-24 MED ORDER — TRULICITY 0.75 MG/0.5ML ~~LOC~~ SOAJ
0.7500 mg | SUBCUTANEOUS | 1 refills | Status: DC
  Filled 2021-06-24: qty 2, 28d supply, fill #0
  Filled 2021-07-29: qty 2, 28d supply, fill #1

## 2021-06-25 ENCOUNTER — Other Ambulatory Visit: Payer: Self-pay

## 2021-06-26 ENCOUNTER — Other Ambulatory Visit: Payer: Self-pay

## 2021-07-15 ENCOUNTER — Other Ambulatory Visit: Payer: Self-pay | Admitting: Nurse Practitioner

## 2021-07-15 DIAGNOSIS — E785 Hyperlipidemia, unspecified: Secondary | ICD-10-CM

## 2021-07-16 ENCOUNTER — Other Ambulatory Visit: Payer: Self-pay

## 2021-07-16 MED ORDER — ATORVASTATIN CALCIUM 40 MG PO TABS
ORAL_TABLET | Freq: Every day | ORAL | 1 refills | Status: DC
  Filled 2021-07-16: qty 30, 30d supply, fill #0
  Filled 2021-09-22: qty 30, 30d supply, fill #1
  Filled 2021-11-03: qty 30, 30d supply, fill #0
  Filled 2021-11-03: qty 30, 30d supply, fill #2
  Filled 2021-12-11: qty 30, 30d supply, fill #1
  Filled 2022-01-20: qty 30, 30d supply, fill #2
  Filled 2022-02-26: qty 30, 30d supply, fill #3

## 2021-07-16 NOTE — Telephone Encounter (Signed)
Requested Prescriptions  Pending Prescriptions Disp Refills  . atorvastatin (LIPITOR) 40 MG tablet 90 tablet 1    Sig: TAKE 1 TABLET (40 MG TOTAL) BY MOUTH DAILY.     Cardiovascular:  Antilipid - Statins Failed - 07/15/2021  7:02 PM      Failed - HDL in normal range and within 360 days    HDL  Date Value Ref Range Status  12/19/2020 33 (L) >39 mg/dL Final         Failed - Triglycerides in normal range and within 360 days    Triglycerides  Date Value Ref Range Status  12/19/2020 265 (H) 0 - 149 mg/dL Final         Passed - Total Cholesterol in normal range and within 360 days    Cholesterol, Total  Date Value Ref Range Status  12/19/2020 159 100 - 199 mg/dL Final         Passed - LDL in normal range and within 360 days    LDL Chol Calc (NIH)  Date Value Ref Range Status  12/19/2020 82 0 - 99 mg/dL Final         Passed - Patient is not pregnant      Passed - Valid encounter within last 12 months    Recent Outpatient Visits          3 weeks ago Type 2 diabetes mellitus with hyperglycemia, without long-term current use of insulin ([place])   [place], [place] W, NP   3 months ago Type 2 diabetes mellitus with hyperglycemia, without long-term current use of insulin [facility])   [place], [place] W, NP   4 months ago Uncontrolled type 2 diabetes mellitus, without long-term current use of insulin ([place])   [place], Annie Main L, RPH-CPP   5 months ago Uncontrolled type 2 diabetes mellitus, without long-term current use of insulin [facility])   [place], Annie Main L, RPH-CPP   6 months ago Controlled type 2 diabetes mellitus without complication, without long-term current use of insulin [facility])   [place], Vernia Buff, NP

## 2021-07-17 ENCOUNTER — Other Ambulatory Visit: Payer: Self-pay

## 2021-07-30 ENCOUNTER — Other Ambulatory Visit: Payer: Self-pay

## 2021-08-03 ENCOUNTER — Other Ambulatory Visit: Payer: Self-pay

## 2021-08-18 ENCOUNTER — Other Ambulatory Visit: Payer: Self-pay

## 2021-08-18 ENCOUNTER — Other Ambulatory Visit: Payer: Self-pay | Admitting: Nurse Practitioner

## 2021-08-18 DIAGNOSIS — M542 Cervicalgia: Secondary | ICD-10-CM

## 2021-08-18 MED ORDER — NAPROXEN 500 MG PO TABS
ORAL_TABLET | Freq: Two times a day (BID) | ORAL | 1 refills | Status: DC
  Filled 2021-08-18 – 2021-11-05 (×2): qty 60, 30d supply, fill #0

## 2021-08-18 NOTE — Telephone Encounter (Signed)
Requested Prescriptions  Pending Prescriptions Disp Refills  . naproxen (NAPROSYN) 500 MG tablet 60 tablet 1    Sig: TAKE 1 TABLET (500 MG TOTAL) BY MOUTH 2 (TWO) TIMES DAILY WITH A MEAL.     Analgesics:  NSAIDS Passed - 08/18/2021  8:36 AM      Passed - Cr in normal range and within 360 days    Creat  Date Value Ref Range Status  01/02/2015 0.80 0.50 - 1.35 mg/dL Final   Creatinine, Ser  Date Value Ref Range Status  06/23/2021 0.94 0.76 - 1.27 mg/dL Final   Creatinine, Urine  Date Value Ref Range Status  09/14/2016 145 20 - 370 mg/dL Final         Passed - HGB in normal range and within 360 days    Hemoglobin  Date Value Ref Range Status  12/19/2020 15.0 13.0 - 17.7 g/dL Final         Passed - Patient is not pregnant      Passed - Valid encounter within last 12 months    Recent Outpatient Visits          1 month ago Type 2 diabetes mellitus with hyperglycemia, without long-term current use of insulin ([place])   [place], [place] W, NP   4 months ago Type 2 diabetes mellitus with hyperglycemia, without long-term current use of insulin [facility])   [place], [place] W, NP   5 months ago Uncontrolled type 2 diabetes mellitus, without long-term current use of insulin [facility])   [place], Annie Main L, RPH-CPP   7 months ago Uncontrolled type 2 diabetes mellitus, without long-term current use of insulin [facility])   [place], Annie Main L, RPH-CPP   8 months ago Controlled type 2 diabetes mellitus without complication, without long-term current use of insulin [facility])   [place], Vernia Buff, NP

## 2021-08-24 ENCOUNTER — Other Ambulatory Visit: Payer: Self-pay | Admitting: Nurse Practitioner

## 2021-08-24 ENCOUNTER — Other Ambulatory Visit: Payer: Self-pay

## 2021-08-24 MED ORDER — TRULICITY 0.75 MG/0.5ML ~~LOC~~ SOAJ
0.7500 mg | SUBCUTANEOUS | 0 refills | Status: DC
  Filled 2021-08-24 – 2021-09-06 (×2): qty 2, 28d supply, fill #0

## 2021-08-24 NOTE — Telephone Encounter (Signed)
Requested Prescriptions  Pending Prescriptions Disp Refills  . Dulaglutide (TRULICITY) 5.05 LZ/7.6BH SOPN 2 mL 0    Sig: Inject 0.75 mg into the skin once a week.     Endocrinology:  Diabetes - GLP-1 Receptor Agonists Passed - 08/24/2021 11:46 AM      Passed - HBA1C is between 0 and 7.9 and within 180 days    Hemoglobin A1C  Date Value Ref Range Status  06/23/2021 6.3 (A) 4.0 - 5.6 % Final   HbA1c, POC (controlled diabetic range)  Date Value Ref Range Status  03/23/2021 7.8 (A) 0.0 - 7.0 % Final         Passed - Valid encounter within last 6 months    Recent Outpatient Visits          2 months ago Type 2 diabetes mellitus with hyperglycemia, without long-term current use of insulin [facility])   [place], [place] W, NP   5 months ago Type 2 diabetes mellitus with hyperglycemia, without long-term current use of insulin [facility])   [place], [place] W, NP   5 months ago Uncontrolled type 2 diabetes mellitus, without long-term current use of insulin [facility])   [place], Annie Main L, RPH-CPP   7 months ago Uncontrolled type 2 diabetes mellitus, without long-term current use of insulin [facility])   [place], Annie Main L, RPH-CPP   8 months ago Controlled type 2 diabetes mellitus without complication, without long-term current use of insulin [facility])   [place], Vernia Buff, NP

## 2021-08-25 ENCOUNTER — Other Ambulatory Visit: Payer: Self-pay

## 2021-09-01 ENCOUNTER — Other Ambulatory Visit: Payer: Self-pay

## 2021-09-07 ENCOUNTER — Other Ambulatory Visit: Payer: Self-pay

## 2021-09-08 ENCOUNTER — Other Ambulatory Visit: Payer: Self-pay

## 2021-09-14 ENCOUNTER — Other Ambulatory Visit: Payer: Self-pay

## 2021-09-22 ENCOUNTER — Other Ambulatory Visit: Payer: Self-pay

## 2021-09-23 ENCOUNTER — Other Ambulatory Visit: Payer: Self-pay

## 2021-09-24 ENCOUNTER — Other Ambulatory Visit: Payer: Self-pay

## 2021-11-03 ENCOUNTER — Other Ambulatory Visit: Payer: Self-pay | Admitting: Nurse Practitioner

## 2021-11-03 ENCOUNTER — Other Ambulatory Visit: Payer: Self-pay

## 2021-11-03 DIAGNOSIS — E1165 Type 2 diabetes mellitus with hyperglycemia: Secondary | ICD-10-CM

## 2021-11-05 ENCOUNTER — Other Ambulatory Visit: Payer: Self-pay | Admitting: Nurse Practitioner

## 2021-11-05 ENCOUNTER — Other Ambulatory Visit: Payer: Self-pay

## 2021-11-05 DIAGNOSIS — E1165 Type 2 diabetes mellitus with hyperglycemia: Secondary | ICD-10-CM

## 2021-11-05 DIAGNOSIS — I10 Essential (primary) hypertension: Secondary | ICD-10-CM

## 2021-11-05 MED ORDER — METFORMIN HCL 1000 MG PO TABS
ORAL_TABLET | Freq: Two times a day (BID) | ORAL | 2 refills | Status: DC
  Filled 2021-11-05: qty 60, 30d supply, fill #0
  Filled 2021-12-11: qty 60, 30d supply, fill #1
  Filled 2022-01-20: qty 60, 30d supply, fill #2

## 2021-11-05 MED ORDER — LISINOPRIL 10 MG PO TABS
ORAL_TABLET | Freq: Every day | ORAL | 1 refills | Status: DC
  Filled 2021-11-05: qty 30, 30d supply, fill #0
  Filled 2021-12-11: qty 30, 30d supply, fill #1
  Filled 2022-01-20: qty 30, 30d supply, fill #2
  Filled 2022-02-26: qty 30, 30d supply, fill #3
  Filled 2022-03-31: qty 30, 30d supply, fill #4
  Filled 2022-05-03: qty 30, 30d supply, fill #5

## 2021-11-05 MED ORDER — TRULICITY 0.75 MG/0.5ML ~~LOC~~ SOAJ
0.7500 mg | SUBCUTANEOUS | 2 refills | Status: DC
  Filled 2021-11-05: qty 2, 28d supply, fill #0
  Filled 2021-12-11: qty 2, 28d supply, fill #1
  Filled 2022-01-20: qty 2, 28d supply, fill #2

## 2021-11-05 NOTE — Telephone Encounter (Signed)
Requested Prescriptions  Pending Prescriptions Disp Refills   metFORMIN (GLUCOPHAGE) 1000 MG tablet 60 tablet 2    Sig: TAKE 1 TABLET (1,000 MG TOTAL) BY MOUTH 2 (TWO) TIMES DAILY WITH A MEAL.     Endocrinology:  Diabetes - Biguanides Failed - 11/05/2021  8:18 AM      Failed - B12 Level in normal range and within 720 days    No results found for: VITAMINB12       Failed - CBC within normal limits and completed in the last 12 months    WBC  Date Value Ref Range Status  12/19/2020 5.9 3.4 - 10.8 x10E3/uL Final  09/23/2015 8.9 4.0 - 10.5 K/uL Final   RBC  Date Value Ref Range Status  12/19/2020 4.99 4.14 - 5.80 x10E6/uL Final  09/23/2015 5.13 4.22 - 5.81 Mil/uL Final   Hemoglobin  Date Value Ref Range Status  12/19/2020 15.0 13.0 - 17.7 g/dL Final   Hematocrit  Date Value Ref Range Status  12/19/2020 44.5 37.5 - 51.0 % Final   MCHC  Date Value Ref Range Status  12/19/2020 33.7 31.5 - 35.7 g/dL Final  09/23/2015 33.8 30.0 - 36.0 g/dL Final   [facility]  Date Value Ref Range Status  12/19/2020 30.1 26.6 - 33.0 pg Final  01/02/2015 30.8 26.0 - 34.0 pg Final   MCV  Date Value Ref Range Status  12/19/2020 89 79 - 97 fL Final   No results found for: PLTCOUNTKUC, LABPLAT, POCPLA RDW  Date Value Ref Range Status  12/19/2020 14.1 11.6 - 15.4 % Final         Passed - Cr in normal range and within 360 days    Creat  Date Value Ref Range Status  01/02/2015 0.80 0.50 - 1.35 mg/dL Final   Creatinine, Ser  Date Value Ref Range Status  06/23/2021 0.94 0.76 - 1.27 mg/dL Final   Creatinine, Urine  Date Value Ref Range Status  09/14/2016 145 20 - 370 mg/dL Final         Passed - HBA1C is between 0 and 7.9 and within 180 days    Hemoglobin A1C  Date Value Ref Range Status  06/23/2021 6.3 (A) 4.0 - 5.6 % Final   HbA1c, POC (controlled diabetic range)  Date Value Ref Range Status  03/23/2021 7.8 (A) 0.0 - 7.0 % Final         Passed - eGFR in normal range and within 360 days     GFR, Est African American  Date Value Ref Range Status  01/02/2015 >89 mL/min Final   GFR calc Af Amer  Date Value Ref Range Status  06/20/2020 107 >59 mL/min/1.73 Final    Comment:    **[facility] currently reports eGFR in compliance with the current**   recommendations of the [employer]. [facility] will   update reporting as new guidelines are published from the NKF-ASN   Task force.    GFR, Est Non African American  Date Value Ref Range Status  01/02/2015 >89 mL/min Final    Comment:      The estimated GFR is a calculation valid for adults (>=34 years old) that uses the CKD-EPI algorithm to adjust for age and sex. It is   not to be used for children, pregnant women, hospitalized patients,    patients on dialysis, or with rapidly changing kidney function. According to the NKDEP, eGFR >89 is normal, 60-89 shows mild impairment, 30-59 shows moderate impairment, 15-29 shows severe impairment and <15  is ESRD.      GFR calc non Af Amer  Date Value Ref Range Status  06/20/2020 93 >59 mL/min/1.73 Final   GFR  Date Value Ref Range Status  09/23/2015 83.73 >60.00 mL/min Final   eGFR  Date Value Ref Range Status  06/23/2021 92 >59 mL/min/1.73 Final         Passed - Valid encounter within last 6 months    Recent Outpatient Visits          4 months ago Type 2 diabetes mellitus with hyperglycemia, without long-term current use of insulin ([place])   [place], [place] W, NP   7 months ago Type 2 diabetes mellitus with hyperglycemia, without long-term current use of insulin [facility])   [place], [place] W, NP   8 months ago Uncontrolled type 2 diabetes mellitus, without long-term current use of insulin [facility])   [place], Annie Main L, RPH-CPP   9 months ago Uncontrolled type 2 diabetes mellitus, without long-term current use of insulin [facility])   [place], Annie Main L, RPH-CPP   10 months ago Controlled type 2 diabetes mellitus without complication, without long-term current use of insulin [facility])   [place], Vernia Buff, NP      Future Appointments            In 2 months Gildardo Pounds, NP [place]            lisinopril (ZESTRIL) 10 MG tablet 90 tablet 1    Sig: TAKE 1 TABLET (10 MG TOTAL) BY MOUTH DAILY.     Cardiovascular:  ACE Inhibitors Passed - 11/05/2021  8:18 AM      Passed - Cr in normal range and within 180 days    Creat  Date Value Ref Range Status  01/02/2015 0.80 0.50 - 1.35 mg/dL Final   Creatinine, Ser  Date Value Ref Range Status  06/23/2021 0.94 0.76 - 1.27 mg/dL Final   Creatinine, Urine  Date Value Ref Range Status  09/14/2016 145 20 - 370 mg/dL Final         Passed - K in normal range and within 180 days    Potassium  Date Value Ref Range Status  06/23/2021 4.8 3.5 - 5.2 mmol/L Final         Passed - Patient is not pregnant      Passed - Last BP in normal range    BP Readings from Last 1 Encounters:  06/23/21 124/80         Passed - Valid encounter within last 6 months    Recent Outpatient Visits          4 months ago Type 2 diabetes mellitus with hyperglycemia, without long-term current use of insulin ([place])   [place], [place] W, NP   7 months ago Type 2 diabetes mellitus with hyperglycemia, without long-term current use of insulin [facility])   [place], [place] W, NP   8 months ago Uncontrolled type 2 diabetes mellitus, without long-term current use of insulin [facility])   [place], Annie Main L, RPH-CPP   9 months ago Uncontrolled type 2 diabetes mellitus, without long-term current use of insulin ([place])   [place]  Delena Serve,  RPH-CPP   10 months ago Controlled type 2 diabetes mellitus without complication, without long-term current use of insulin [facility])   [place], Vernia Buff, NP      Future Appointments            In 2 months Gildardo Pounds, NP [place]            Dulaglutide (TRULICITY) 9.65 ML/9.9OJ SOPN 2 mL 2    Sig: Inject 0.75 mg into the skin once a week.     Endocrinology:  Diabetes - GLP-1 Receptor Agonists Passed - 11/05/2021  8:18 AM      Passed - HBA1C is between 0 and 7.9 and within 180 days    Hemoglobin A1C  Date Value Ref Range Status  06/23/2021 6.3 (A) 4.0 - 5.6 % Final   HbA1c, POC (controlled diabetic range)  Date Value Ref Range Status  03/23/2021 7.8 (A) 0.0 - 7.0 % Final         Passed - Valid encounter within last 6 months    Recent Outpatient Visits          4 months ago Type 2 diabetes mellitus with hyperglycemia, without long-term current use of insulin [facility])   [place], [place] W, NP   7 months ago Type 2 diabetes mellitus with hyperglycemia, without long-term current use of insulin [facility])   [place], [place] W, NP   8 months ago Uncontrolled type 2 diabetes mellitus, without long-term current use of insulin [facility])   [place], Annie Main L, RPH-CPP   9 months ago Uncontrolled type 2 diabetes mellitus, without long-term current use of insulin [facility])   [place], Annie Main L, RPH-CPP   10 months ago Controlled type 2 diabetes mellitus without complication, without long-term current use of insulin [facility])   [place], Vernia Buff, NP      Future Appointments            In 2 months Gildardo Pounds, NP [place]

## 2021-11-09 ENCOUNTER — Other Ambulatory Visit: Payer: Self-pay

## 2021-12-11 ENCOUNTER — Other Ambulatory Visit: Payer: Self-pay

## 2021-12-14 ENCOUNTER — Other Ambulatory Visit: Payer: Self-pay

## 2022-01-06 ENCOUNTER — Ambulatory Visit: Payer: Self-pay | Attending: Nurse Practitioner | Admitting: Nurse Practitioner

## 2022-01-06 VITALS — BP 113/75 | HR 100 | Temp 98.2°F | Resp 20 | Ht 73.0 in | Wt 220.0 lb

## 2022-01-06 DIAGNOSIS — I10 Essential (primary) hypertension: Secondary | ICD-10-CM

## 2022-01-06 DIAGNOSIS — E1165 Type 2 diabetes mellitus with hyperglycemia: Secondary | ICD-10-CM

## 2022-01-06 DIAGNOSIS — E785 Hyperlipidemia, unspecified: Secondary | ICD-10-CM

## 2022-01-06 NOTE — Progress Notes (Signed)
? ?Assessment & Plan:  ?[name] was seen today for hypertension, diabetes and hyperlipidemia. ? ?Diagnoses and all orders for this visit: ? ?Essential hypertension ?-     CMP14+EGFR ?Continue all antihypertensives as prescribed.  ?Remember to bring in your blood pressure log with you for your follow up appointment.  ?DASH/Mediterranean Diets are healthier choices for HTN.   ? ?Dyslipidemia, goal LDL below 70 ?-     Lipid panel ?INSTRUCTIONS: Work on a low fat, heart healthy diet and participate in regular aerobic exercise program by working out at least 150 minutes per week; 5 days a week-30 minutes per day. Avoid red meat/beef/steak,  fried foods. junk foods, sodas, sugary drinks, unhealthy snacking, alcohol and smoking.  Drink at least 80 oz of water per day and monitor your carbohydrate intake daily.   ? ?Type 2 diabetes mellitus with hyperglycemia, without long-term current use of insulin ([place]) ?-     Hemoglobin A1c ?Continue blood sugar control as discussed in office today, low carbohydrate diet, and regular physical exercise as tolerated, 150 minutes per week (30 min each day, 5 days per week, or 50 min 3 days per week). Keep blood sugar logs with fasting goal of 90-130 mg/dl, post prandial (after you eat) less than 180.  ?For Hypoglycemia: BS <60 and Hyperglycemia BS >400; contact the clinic ASAP. ? ? ? ? ?Patient has been counseled on age-appropriate routine health concerns for screening and prevention. These are reviewed and up-to-date. Referrals have been placed accordingly. Immunizations are up-to-date or declined.    ?Subjective:  ? ?Chief Complaint  ?Patient presents with  ? Hypertension  ? Diabetes  ? Hyperlipidemia  ? ?HPI ?Kyle Campos 66 y.o. male presents to office today for follow up to HTN, HPL and DM. ?He has a past medical history of Colon polyps (03/14/2015), DM2,  Diverticulosis, Hepatic steatosis, Hyperlipidemia, Hypertension, Lung nodules, and Smoker.  ? ?HTN ?Blood pressure is well  controlled. Taking lisinopril 10 mg daily.  ?BP Readings from Last 3 Encounters:  ?01/06/22 113/75  ?06/23/21 124/80  ?03/23/21 124/77  ? ? ?DM ?Diabetes improving but not quite at goal. Taking metformin 9381 mg BID and Trulicity 8.29 weekly. LDL not at goal although he endorses adherence taking atorvastatin 40 mg daily.  ?Lab Results  ?Component Value Date  ? HGBA1C 7.7 (H) 01/06/2022  ?  ?Lab Results  ?Component Value Date  ? CHOL WILL FOLLOW 01/06/2022  ? CHOL 159 12/19/2020  ? CHOL 130 01/14/2020  ? ?Lab Results  ?Component Value Date  ? HDL WILL FOLLOW 01/06/2022  ? HDL 33 (L) 12/19/2020  ? HDL 38 (L) 01/14/2020  ? ?Lab Results  ?Component Value Date  ? [place] WILL FOLLOW 01/06/2022  ? [place] 82 12/19/2020  ? [place] 55 01/14/2020  ? ?Lab Results  ?Component Value Date  ? TRIG WILL FOLLOW 01/06/2022  ? TRIG 265 (H) 12/19/2020  ? TRIG 234 (H) 01/14/2020  ? ?Lab Results  ?Component Value Date  ? CHOLHDL WILL FOLLOW 01/06/2022  ? CHOLHDL 4.8 12/19/2020  ? CHOLHDL 3.4 01/14/2020  ? ? ?RASH ?Rash has been present on leg for many years. Started off as an insect bite. Steroid creams and ointments ineffective. Associated symptoms include pruritis.  ? ? ? ? ? ? ?Review of Systems  ?Constitutional:  Negative for fever, malaise/fatigue and weight loss.  ?HENT: Negative.  Negative for nosebleeds.   ?Eyes: Negative.  Negative for blurred vision, double vision and photophobia.  ?Respiratory: Negative.  Negative for  cough and shortness of breath.   ?Cardiovascular: Negative.  Negative for chest pain, palpitations and leg swelling.  ?Gastrointestinal: Negative.  Negative for heartburn, nausea and vomiting.  ?Musculoskeletal: Negative.  Negative for myalgias.  ?Skin:  Positive for itching and rash.  ?Neurological: Negative.  Negative for dizziness, focal weakness, seizures and headaches.  ?Psychiatric/Behavioral: Negative.  Negative for suicidal ideas.   ? ?Past Medical History:  ?Diagnosis Date  ? Colon polyps 03/14/2015  ?  Tubular adenoma x 6  ? Diabetes mellitus ([place])   ? Diverticulosis   ? Hepatic steatosis   ? Hyperlipidemia   ? Hypertension   ? Lung nodules   ? Smoker   ? ? ?Past Surgical History:  ?Procedure Laterality Date  ? APPENDECTOMY  at age 45   ? colonoscopy  03/14/2015  ? ? ?Family History  ?Problem Relation Age of Onset  ? Diabetes Mother   ? Cancer Father   ?     lung cancer   ? Stroke Maternal Grandfather   ? Colon polyps Brother   ? Colon cancer Neg Hx   ? Esophageal cancer Neg Hx   ? Rectal cancer Neg Hx   ? Stomach cancer Neg Hx   ? ? ?Social History Reviewed with no changes to be made today.  ? ?Outpatient Medications Prior to Visit  ?Medication Sig Dispense Refill  ? albuterol (VENTOLIN HFA) 108 (90 Base) MCG/ACT inhaler Inhale 2 puffs into the lungs every 6 (six) hours as needed for wheezing or shortness of breath. NEEDS PASS 18 g 2  ? atorvastatin (LIPITOR) 40 MG tablet TAKE 1 TABLET (40 MG TOTAL) BY MOUTH DAILY. 90 tablet 1  ? Blood Glucose Monitoring Suppl (TRUE METRIX METER) w/Device KIT Monitor blood glucose levels twice per day 1 kit 0  ? cyclobenzaprine (FLEXERIL) 10 MG tablet Take 1 tablet (10 mg total) by mouth 3 (three) times daily as needed for muscle spasms. 60 tablet 0  ? Dulaglutide (TRULICITY) 4.19 FX/9.0WI SOPN Inject 0.75 mg into the skin once a week. 2 mL 2  ? glucose blood (TRUE METRIX BLOOD GLUCOSE TEST) test strip Monitor blood glucose levels twice per day 100 each 2  ? lisinopril (ZESTRIL) 10 MG tablet TAKE 1 TABLET (10 MG TOTAL) BY MOUTH DAILY. 90 tablet 1  ? metFORMIN (GLUCOPHAGE) 1000 MG tablet TAKE 1 TABLET (1,000 MG TOTAL) BY MOUTH 2 (TWO) TIMES DAILY WITH A MEAL. 60 tablet 2  ? naproxen (NAPROSYN) 500 MG tablet TAKE 1 TABLET (500 MG TOTAL) BY MOUTH 2 (TWO) TIMES DAILY WITH A MEAL. 60 tablet 1  ? triamcinolone ointment (KENALOG) 0.5 % Apply 1 application topically 2 (two) times daily. 60 g 1  ? TRUEplus Lancets 28G MISC Monitor blood glucose levels twice per day 100 each 2  ? ?No  facility-administered medications prior to visit.  ? ? ?No Known Allergies ? ?   ?Objective:  ?  ?BP 113/75   Pulse 100   Temp 98.2 ?F (36.8 ?C)   Resp 20   Ht $R'6\' 1"'mr$  (1.854 m)   Wt 220 lb (99.8 kg)   SpO2 93%   BMI 29.03 kg/m?  ?Wt Readings from Last 3 Encounters:  ?01/06/22 220 lb (99.8 kg)  ?06/23/21 215 lb 4 oz (97.6 kg)  ?03/23/21 213 lb 9.6 oz (96.9 kg)  ? ? ?Physical Exam ?Vitals and nursing note reviewed.  ?Constitutional:   ?   Appearance: He is well-developed.  ?HENT:  ?   Head: Normocephalic and atraumatic.  ?  Cardiovascular:  ?   Rate and Rhythm: Normal rate and regular rhythm.  ?   Heart sounds: Normal heart sounds. No murmur heard. ?  No friction rub. No gallop.  ?Pulmonary:  ?   Effort: Pulmonary effort is normal. No tachypnea or respiratory distress.  ?   Breath sounds: Normal breath sounds. No decreased breath sounds, wheezing, rhonchi or rales.  ?Chest:  ?   Chest wall: No tenderness.  ?Abdominal:  ?   General: Bowel sounds are normal.  ?   Palpations: Abdomen is soft.  ?Musculoskeletal:     ?   General: Normal range of motion.  ?   Cervical back: Normal range of motion.  ?Skin: ?   General: Skin is warm and dry.  ?   Comments: SEE PHOTOS  ?Neurological:  ?   Mental Status: He is alert and oriented to person, place, and time.  ?   Coordination: Coordination normal.  ?Psychiatric:     ?   Behavior: Behavior normal. Behavior is cooperative.     ?   Thought Content: Thought content normal.     ?   Judgment: Judgment normal.  ? ? ? ? ?   ?Patient has been counseled extensively about nutrition and exercise as well as the importance of adherence with medications and regular follow-up. The patient was given clear instructions to go to ER or return to medical center if symptoms don't improve, worsen or new problems develop. The patient verbalized understanding.  ? ?Follow-up: Return in about 3 months (around 04/07/2022).  ? ?Gildardo Pounds, FNP-BC ?[place] ?[place], [place] ?[phone]   ?01/07/2022, 10:37 PM ?

## 2022-01-06 NOTE — Progress Notes (Signed)
Cream for place on left leg ?

## 2022-01-07 ENCOUNTER — Encounter: Payer: Self-pay | Admitting: Nurse Practitioner

## 2022-01-08 LAB — LIPID PANEL
Chol/HDL Ratio: 4.3 ratio (ref 0.0–5.0)
Cholesterol, Total: 137 mg/dL (ref 100–199)
HDL: 32 mg/dL — ABNORMAL LOW (ref >39)
LDL Chol Calc (NIH): 51 mg/dL (ref 0–99)
Triglycerides: 358 mg/dL — ABNORMAL HIGH (ref 0–149)
VLDL Cholesterol Cal: 54 mg/dL — ABNORMAL HIGH (ref 5–40)

## 2022-01-08 LAB — CMP14+EGFR
ALT: 27 IU/L (ref 0–44)
AST: 21 IU/L (ref 0–40)
Albumin/Globulin Ratio: 2 (ref 1.2–2.2)
Albumin: 4.7 g/dL (ref 3.8–4.8)
Alkaline Phosphatase: 57 IU/L (ref 44–121)
BUN/Creatinine Ratio: 23 (ref 10–24)
BUN: 27 mg/dL (ref 8–27)
Bilirubin Total: 0.4 mg/dL (ref 0.0–1.2)
CO2: 24 mmol/L (ref 20–29)
Calcium: 9.8 mg/dL (ref 8.6–10.2)
Chloride: 100 mmol/L (ref 96–106)
Creatinine, Ser: 1.15 mg/dL (ref 0.76–1.27)
Globulin, Total: 2.3 g/dL (ref 1.5–4.5)
Glucose: 149 mg/dL — ABNORMAL HIGH (ref 70–99)
Potassium: 4.4 mmol/L (ref 3.5–5.2)
Sodium: 139 mmol/L (ref 134–144)
Total Protein: 7 g/dL (ref 6.0–8.5)
eGFR: 72 mL/min/{1.73_m2} (ref >59)

## 2022-01-08 LAB — HEMOGLOBIN A1C
Est. average glucose Bld gHb Est-mCnc: 174 mg/dL
Hgb A1c MFr Bld: 7.7 % — ABNORMAL HIGH (ref 4.8–5.6)

## 2022-01-20 ENCOUNTER — Other Ambulatory Visit: Payer: Self-pay

## 2022-01-20 ENCOUNTER — Other Ambulatory Visit: Payer: Self-pay | Admitting: Nurse Practitioner

## 2022-01-20 DIAGNOSIS — M542 Cervicalgia: Secondary | ICD-10-CM

## 2022-01-20 MED ORDER — NAPROXEN 500 MG PO TABS
ORAL_TABLET | Freq: Two times a day (BID) | ORAL | 1 refills | Status: DC
  Filled 2022-01-20: qty 60, 30d supply, fill #0
  Filled 2022-02-26: qty 60, 30d supply, fill #1

## 2022-01-21 ENCOUNTER — Other Ambulatory Visit: Payer: Self-pay

## 2022-01-22 ENCOUNTER — Other Ambulatory Visit: Payer: Self-pay

## 2022-02-26 ENCOUNTER — Other Ambulatory Visit: Payer: Self-pay

## 2022-02-26 ENCOUNTER — Other Ambulatory Visit: Payer: Self-pay | Admitting: Nurse Practitioner

## 2022-02-26 DIAGNOSIS — E1165 Type 2 diabetes mellitus with hyperglycemia: Secondary | ICD-10-CM

## 2022-02-26 MED ORDER — METFORMIN HCL 1000 MG PO TABS
ORAL_TABLET | Freq: Two times a day (BID) | ORAL | 2 refills | Status: DC
  Filled 2022-02-26: qty 60, 30d supply, fill #0
  Filled 2022-06-10: qty 60, 30d supply, fill #1
  Filled 2022-07-20 – 2022-07-22 (×2): qty 60, 30d supply, fill #2

## 2022-02-26 MED ORDER — TRULICITY 0.75 MG/0.5ML ~~LOC~~ SOAJ
0.7500 mg | SUBCUTANEOUS | 2 refills | Status: DC
  Filled 2022-02-26: qty 2, 28d supply, fill #0
  Filled 2022-04-21: qty 2, 28d supply, fill #1
  Filled 2022-06-10: qty 2, 28d supply, fill #2

## 2022-03-02 ENCOUNTER — Other Ambulatory Visit: Payer: Self-pay

## 2022-03-03 ENCOUNTER — Other Ambulatory Visit (HOSPITAL_COMMUNITY): Payer: Self-pay

## 2022-03-31 ENCOUNTER — Other Ambulatory Visit: Payer: Self-pay

## 2022-03-31 ENCOUNTER — Other Ambulatory Visit: Payer: Self-pay | Admitting: Nurse Practitioner

## 2022-03-31 DIAGNOSIS — E785 Hyperlipidemia, unspecified: Secondary | ICD-10-CM

## 2022-03-31 MED ORDER — ATORVASTATIN CALCIUM 40 MG PO TABS
ORAL_TABLET | Freq: Every day | ORAL | 0 refills | Status: DC
  Filled 2022-03-31: qty 30, 30d supply, fill #0
  Filled 2022-05-03: qty 30, 30d supply, fill #1
  Filled 2022-06-10: qty 30, 30d supply, fill #2

## 2022-03-31 NOTE — Telephone Encounter (Signed)
Requested Prescriptions  Pending Prescriptions Disp Refills  . atorvastatin (LIPITOR) 40 MG tablet 90 tablet 0    Sig: TAKE 1 TABLET (40 MG TOTAL) BY MOUTH DAILY.     Cardiovascular:  Antilipid - Statins Failed - 03/31/2022  8:56 AM      Failed - Lipid Panel in normal range within the last 12 months    Cholesterol, Total  Date Value Ref Range Status  01/06/2022 137 100 - 199 mg/dL Final   LDL Chol Calc (NIH)  Date Value Ref Range Status  01/06/2022 51 0 - 99 mg/dL Final   HDL  Date Value Ref Range Status  01/06/2022 32 (L) >39 mg/dL Final   Triglycerides  Date Value Ref Range Status  01/06/2022 358 (H) 0 - 149 mg/dL Final         Passed - Patient is not pregnant      Passed - Valid encounter within last 12 months    Recent Outpatient Visits          2 months ago Essential hypertension   [place], [place] W, NP   9 months ago Type 2 diabetes mellitus with hyperglycemia, without long-term current use of insulin [facility])   [place], [place] W, NP   1 year ago Type 2 diabetes mellitus with hyperglycemia, without long-term current use of insulin [facility])   [place], [place] W, NP   1 year ago Uncontrolled type 2 diabetes mellitus, without long-term current use of insulin [facility])   [place], RPH-CPP   1 year ago Uncontrolled type 2 diabetes mellitus, without long-term current use of insulin [facility])   [place], RPH-CPP      Future Appointments            In 1 week Gildardo Pounds, NP [place]

## 2022-04-02 ENCOUNTER — Other Ambulatory Visit: Payer: Self-pay

## 2022-04-12 ENCOUNTER — Ambulatory Visit: Payer: Self-pay | Admitting: Nurse Practitioner

## 2022-04-21 ENCOUNTER — Other Ambulatory Visit: Payer: Self-pay

## 2022-04-22 ENCOUNTER — Other Ambulatory Visit: Payer: Self-pay

## 2022-05-03 ENCOUNTER — Other Ambulatory Visit: Payer: Self-pay

## 2022-05-04 ENCOUNTER — Other Ambulatory Visit: Payer: Self-pay

## 2022-05-06 ENCOUNTER — Other Ambulatory Visit: Payer: Self-pay

## 2022-05-24 ENCOUNTER — Ambulatory Visit: Payer: Self-pay | Admitting: Nurse Practitioner

## 2022-06-10 ENCOUNTER — Other Ambulatory Visit: Payer: Self-pay | Admitting: Nurse Practitioner

## 2022-06-10 ENCOUNTER — Other Ambulatory Visit: Payer: Self-pay

## 2022-06-10 DIAGNOSIS — I10 Essential (primary) hypertension: Secondary | ICD-10-CM

## 2022-06-10 MED ORDER — LISINOPRIL 10 MG PO TABS
ORAL_TABLET | Freq: Every day | ORAL | 0 refills | Status: DC
  Filled 2022-06-10: qty 30, 30d supply, fill #0

## 2022-06-14 ENCOUNTER — Other Ambulatory Visit: Payer: Self-pay

## 2022-06-14 ENCOUNTER — Other Ambulatory Visit: Payer: Self-pay | Admitting: Nurse Practitioner

## 2022-06-14 DIAGNOSIS — M542 Cervicalgia: Secondary | ICD-10-CM

## 2022-06-15 ENCOUNTER — Other Ambulatory Visit: Payer: Self-pay

## 2022-06-15 MED ORDER — NAPROXEN 500 MG PO TABS
500.0000 mg | ORAL_TABLET | Freq: Two times a day (BID) | ORAL | 0 refills | Status: DC
  Filled 2022-06-15: qty 28, 14d supply, fill #0

## 2022-06-16 ENCOUNTER — Other Ambulatory Visit: Payer: Self-pay

## 2022-06-17 ENCOUNTER — Other Ambulatory Visit: Payer: Self-pay

## 2022-07-01 ENCOUNTER — Ambulatory Visit: Payer: Self-pay | Attending: Nurse Practitioner | Admitting: Nurse Practitioner

## 2022-07-01 ENCOUNTER — Other Ambulatory Visit: Payer: Self-pay

## 2022-07-01 ENCOUNTER — Encounter: Payer: Self-pay | Admitting: Nurse Practitioner

## 2022-07-01 VITALS — BP 115/71 | HR 78 | Temp 98.1°F | Ht 73.0 in | Wt 213.4 lb

## 2022-07-01 DIAGNOSIS — J40 Bronchitis, not specified as acute or chronic: Secondary | ICD-10-CM

## 2022-07-01 DIAGNOSIS — E782 Mixed hyperlipidemia: Secondary | ICD-10-CM

## 2022-07-01 DIAGNOSIS — E1165 Type 2 diabetes mellitus with hyperglycemia: Secondary | ICD-10-CM

## 2022-07-01 DIAGNOSIS — F17200 Nicotine dependence, unspecified, uncomplicated: Secondary | ICD-10-CM

## 2022-07-01 DIAGNOSIS — L309 Dermatitis, unspecified: Secondary | ICD-10-CM

## 2022-07-01 DIAGNOSIS — Z23 Encounter for immunization: Secondary | ICD-10-CM

## 2022-07-01 DIAGNOSIS — I10 Essential (primary) hypertension: Secondary | ICD-10-CM

## 2022-07-01 LAB — POCT GLYCOSYLATED HEMOGLOBIN (HGB A1C): HbA1c, POC (controlled diabetic range): 7 % (ref 0.0–7.0)

## 2022-07-01 LAB — GLUCOSE, POCT (MANUAL RESULT ENTRY): POC Glucose: 187 mg/dl — AB (ref 70–99)

## 2022-07-01 MED ORDER — BETAMETHASONE DIPROPIONATE 0.05 % EX CREA
TOPICAL_CREAM | Freq: Two times a day (BID) | CUTANEOUS | 0 refills | Status: DC
  Filled 2022-07-01: qty 30, 30d supply, fill #0

## 2022-07-01 MED ORDER — ALBUTEROL SULFATE HFA 108 (90 BASE) MCG/ACT IN AERS
2.0000 | INHALATION_SPRAY | Freq: Four times a day (QID) | RESPIRATORY_TRACT | 2 refills | Status: DC | PRN
  Filled 2022-07-01: qty 6.7, 25d supply, fill #0

## 2022-07-01 MED ORDER — PREDNISONE 20 MG PO TABS
40.0000 mg | ORAL_TABLET | Freq: Every day | ORAL | 0 refills | Status: AC
  Filled 2022-07-01: qty 10, 5d supply, fill #0

## 2022-07-01 NOTE — Patient Instructions (Addendum)
STOOL SOFTENERS Senna PLUS or Senna S Miralax

## 2022-07-01 NOTE — Progress Notes (Signed)
Assessment & Plan:  Kyle Campos was seen today for diabetes.  Diagnoses and all orders for this visit:  Type 2 diabetes mellitus with hyperglycemia, without long-term current use of insulin (HCC) No change with medications today. A1c improved -     POCT glycosylated hemoglobin (Hb A1C) -     POCT glucose (manual entry) -     CMP14+EGFR  Primary hypertension -     CMP14+EGFR Continue lisinopril As prescribed.  Reminded to bring in blood pressure log for follow  up appointment.  RECOMMENDATIONS: DASH/Mediterranean Diets are healthier choices for HTN.    Mixed hypercholesterolemia and hypertriglyceridemia -     Lipid panel  Bronchitis -     predniSONE (DELTASONE) 20 MG tablet; Take 2 tablets (40 mg total) by mouth daily with breakfast for 5 days.  Dermatitis -     betamethasone dipropionate 0.05 % cream; Apply topically 2 (two) times daily.  Tobacco dependence -     albuterol (VENTOLIN HFA) 108 (90 Base) MCG/ACT inhaler; Inhale 2 puffs into the lungs every 6 (six) hours as needed for wheezing or shortness of breath. Smoking cessation recommended    Patient has been counseled on age-appropriate routine health concerns for screening and prevention. These are reviewed and up-to-date. Referrals have been placed accordingly. Immunizations are up-to-date or declined.    Subjective:   Chief Complaint  Patient presents with   Diabetes   Diabetes Pertinent negatives for hypoglycemia include no dizziness, headaches or seizures. Pertinent negatives for diabetes include no blurred vision, no chest pain and no weight loss.   Kyle Campos 66 y.o. male presents to office today for follow up to DM, HTN and HPL   Hypertension Blood pressure is well controlled and he endorses adherence taking lisinopril 10 mg daily. He is exercising and is adherent to low salt diet.  He does not have a blood pressure log  today.  Blood pressure is well controlled at home.  Cardiac symptoms none.   Cardiovascular risk factors: advanced age (older than 53 for men, 93 for women), diabetes mellitus, dyslipidemia, hypertension, male gender, and smoking/ tobacco exposure. Use of agents associated with hypertension: none.  History of target organ damage: none. BP Readings from Last 3 Encounters:  07/01/22 115/71  01/06/22 113/75  06/23/21 124/80    Hyperlipidemia Patient presents for follow up to hyperlipidemia.  He is medication compliant. He is diet compliant and denies chest pain, palpitations, poor exercise tolerance, and tachypnea or statin intolerance including myalgias.  He is taking atorvastatin 40 mg daily as prescribed Lab Results  Component Value Date   CHOL 137 01/06/2022   Lab Results  Component Value Date   HDL 32 (L) 01/06/2022   Lab Results  Component Value Date   LDLCALC 51 01/06/2022   Lab Results  Component Value Date   TRIG 358 (H) 01/06/2022   Lab Results  Component Value Date   CHOLHDL 4.3 01/06/2022      Diabetes Mellitus Type II Current symptoms/problems include hyperglycemia and have been improving.  Known diabetic complications: none Current diabetic medications include: Trulicity 0.34 mg weekly and metformin 500 mg twice daily Eye exam current (within one year): no uninsured Weight trend: decreasing steadily Prior visit with dietician: no/uninsured Current monitoring regimen: office lab tests - quarterly Home blood sugar records:  Infrequent monitoring Any episodes of hypoglycemia? no Is He on ACE inhibitor or angiotensin II receptor blocker?  Yes  Lab Results  Component Value Date   HGBA1C 7.0 07/01/2022  HGBA1C 7.7 (H) 01/06/2022   HGBA1C 6.3 (A) 06/23/2021     Acute Bronchitis: Patient presents for presents evaluation of productive cough and wheezing.  Past history is significant for  tobacco dependence .    Review of Systems  Constitutional:  Negative for fever, malaise/fatigue and weight loss.  HENT: Negative.  Negative for  nosebleeds.   Eyes: Negative.  Negative for blurred vision, double vision and photophobia.  Respiratory:  Positive for cough, sputum production and wheezing. Negative for shortness of breath.   Cardiovascular: Negative.  Negative for chest pain, palpitations and leg swelling.  Gastrointestinal: Negative.  Negative for heartburn, nausea and vomiting.  Musculoskeletal: Negative.  Negative for myalgias.  Skin:  Positive for rash.  Neurological: Negative.  Negative for dizziness, focal weakness, seizures and headaches.  Psychiatric/Behavioral: Negative.  Negative for suicidal ideas.     Past Medical History:  Diagnosis Date   Colon polyps 03/14/2015   Tubular adenoma x 6   Diabetes mellitus (HCC)    Diverticulosis    Hepatic steatosis    Hyperlipidemia    Hypertension    Lung nodules    Smoker     Past Surgical History:  Procedure Laterality Date   APPENDECTOMY  at age 50    colonoscopy  03/14/2015    Family History  Problem Relation Age of Onset   Diabetes Mother    Cancer Father        lung cancer    Stroke Maternal Grandfather    Colon polyps Brother    Colon cancer Neg Hx    Esophageal cancer Neg Hx    Rectal cancer Neg Hx    Stomach cancer Neg Hx     Social History Reviewed with no changes to be made today.   Outpatient Medications Prior to Visit  Medication Sig Dispense Refill   atorvastatin (LIPITOR) 40 MG tablet TAKE 1 TABLET (40 MG TOTAL) BY MOUTH DAILY. 90 tablet 0   Blood Glucose Monitoring Suppl (TRUE METRIX METER) w/Device KIT Monitor blood glucose levels twice per day 1 kit 0   Dulaglutide (TRULICITY) 0.21 JD/5.5MC SOPN Inject 0.75 mg into the skin once a week. 2 mL 2   glucose blood (TRUE METRIX BLOOD GLUCOSE TEST) test strip Monitor blood glucose levels twice per day 100 each 2   lisinopril (ZESTRIL) 10 MG tablet TAKE 1 TABLET (10 MG TOTAL) BY MOUTH DAILY. 30 tablet 0   metFORMIN (GLUCOPHAGE) 1000 MG tablet TAKE 1 TABLET (1,000 MG TOTAL) BY MOUTH 2  (TWO) TIMES DAILY WITH A MEAL. 60 tablet 2   naproxen (NAPROSYN) 500 MG tablet Take 1 tablet (500 mg total) by mouth 2 (two) times daily with a meal. 28 tablet 0   triamcinolone ointment (KENALOG) 0.5 % Apply 1 application topically 2 (two) times daily. 60 g 1   cyclobenzaprine (FLEXERIL) 10 MG tablet Take 1 tablet (10 mg total) by mouth 3 (three) times daily as needed for muscle spasms. (Patient not taking: Reported on 07/01/2022) 60 tablet 0   TRUEplus Lancets 28G MISC Monitor blood glucose levels twice per day (Patient not taking: Reported on 07/01/2022) 100 each 2   albuterol (VENTOLIN HFA) 108 (90 Base) MCG/ACT inhaler Inhale 2 puffs into the lungs every 6 (six) hours as needed for wheezing or shortness of breath. NEEDS PASS (Patient not taking: Reported on 07/01/2022) 18 g 2   No facility-administered medications prior to visit.    No Known Allergies     Objective:    BP  115/71   Pulse 78   Temp 98.1 F (36.7 C) (Oral)   Ht $R'6\' 1"'IZ$  (1.854 m)   Wt 213 lb 6.4 oz (96.8 kg)   SpO2 96%   BMI 28.15 kg/m  Wt Readings from Last 3 Encounters:  07/01/22 213 lb 6.4 oz (96.8 kg)  01/06/22 220 lb (99.8 kg)  06/23/21 215 lb 4 oz (97.6 kg)    Physical Exam Vitals and nursing note reviewed.  Constitutional:      Appearance: He is well-developed.  HENT:     Head: Normocephalic and atraumatic.  Cardiovascular:     Rate and Rhythm: Normal rate and regular rhythm.     Heart sounds: Normal heart sounds. No murmur heard.    No friction rub. No gallop.  Pulmonary:     Effort: Pulmonary effort is normal. No tachypnea, accessory muscle usage or respiratory distress.     Breath sounds: Wheezing and rhonchi present. No decreased breath sounds or rales.  Chest:     Chest wall: No tenderness.  Abdominal:     General: Bowel sounds are normal.     Palpations: Abdomen is soft.  Musculoskeletal:        General: Normal range of motion.     Cervical back: Normal range of motion.  Skin:    General:  Skin is warm and dry.     Findings: Lesion present.          Comments: Macular hyperpigmented scaly lesion on lower left leg. Approximately 0.25cm   Neurological:     Mental Status: He is alert and oriented to person, place, and time.     Coordination: Coordination normal.  Psychiatric:        Behavior: Behavior normal. Behavior is cooperative.        Thought Content: Thought content normal.        Judgment: Judgment normal.          Patient has been counseled extensively about nutrition and exercise as well as the importance of adherence with medications and regular follow-up. The patient was given clear instructions to go to ER or return to medical center if symptoms don't improve, worsen or new problems develop. The patient verbalized understanding.   Follow-up: Return in about 3 months (around 09/30/2022) for PHYSICAL.   Gildardo Pounds, FNP-BC [facility] [place], [place]   07/01/2022, 9:13 AM

## 2022-07-02 LAB — LIPID PANEL
Chol/HDL Ratio: 3.4 ratio (ref 0.0–5.0)
Cholesterol, Total: 115 mg/dL (ref 100–199)
HDL: 34 mg/dL — ABNORMAL LOW (ref >39)
LDL Chol Calc (NIH): 53 mg/dL (ref 0–99)
Triglycerides: 162 mg/dL — ABNORMAL HIGH (ref 0–149)
VLDL Cholesterol Cal: 28 mg/dL (ref 5–40)

## 2022-07-02 LAB — CMP14+EGFR
ALT: 30 IU/L (ref 0–44)
AST: 22 IU/L (ref 0–40)
Albumin/Globulin Ratio: 1.7 (ref 1.2–2.2)
Albumin: 4.6 g/dL (ref 3.9–4.9)
Alkaline Phosphatase: 58 IU/L (ref 44–121)
BUN/Creatinine Ratio: 19 (ref 10–24)
BUN: 18 mg/dL (ref 8–27)
Bilirubin Total: 0.3 mg/dL (ref 0.0–1.2)
CO2: 23 mmol/L (ref 20–29)
Calcium: 9.8 mg/dL (ref 8.6–10.2)
Chloride: 101 mmol/L (ref 96–106)
Creatinine, Ser: 0.96 mg/dL (ref 0.76–1.27)
Globulin, Total: 2.7 g/dL (ref 1.5–4.5)
Glucose: 145 mg/dL — ABNORMAL HIGH (ref 70–99)
Potassium: 5 mmol/L (ref 3.5–5.2)
Sodium: 139 mmol/L (ref 134–144)
Total Protein: 7.3 g/dL (ref 6.0–8.5)
eGFR: 89 mL/min/1.73

## 2022-07-07 ENCOUNTER — Other Ambulatory Visit: Payer: Self-pay

## 2022-07-20 ENCOUNTER — Other Ambulatory Visit: Payer: Self-pay | Admitting: Family Medicine

## 2022-07-20 ENCOUNTER — Other Ambulatory Visit: Payer: Self-pay | Admitting: Nurse Practitioner

## 2022-07-20 ENCOUNTER — Other Ambulatory Visit: Payer: Self-pay

## 2022-07-20 DIAGNOSIS — I10 Essential (primary) hypertension: Secondary | ICD-10-CM

## 2022-07-20 DIAGNOSIS — M542 Cervicalgia: Secondary | ICD-10-CM

## 2022-07-20 DIAGNOSIS — E785 Hyperlipidemia, unspecified: Secondary | ICD-10-CM

## 2022-07-20 MED ORDER — ATORVASTATIN CALCIUM 40 MG PO TABS
ORAL_TABLET | Freq: Every day | ORAL | 2 refills | Status: DC
  Filled 2022-07-20: qty 30, 30d supply, fill #0
  Filled 2022-08-27: qty 30, 30d supply, fill #1

## 2022-07-20 MED ORDER — LISINOPRIL 10 MG PO TABS
ORAL_TABLET | Freq: Every day | ORAL | 2 refills | Status: DC
  Filled 2022-07-20: qty 30, 30d supply, fill #0
  Filled 2022-08-27: qty 30, 30d supply, fill #1

## 2022-07-20 MED ORDER — NAPROXEN 500 MG PO TABS
500.0000 mg | ORAL_TABLET | Freq: Two times a day (BID) | ORAL | 0 refills | Status: DC
  Filled 2022-07-20: qty 60, 30d supply, fill #0

## 2022-07-22 ENCOUNTER — Other Ambulatory Visit: Payer: Self-pay

## 2022-08-16 ENCOUNTER — Other Ambulatory Visit: Payer: Self-pay | Admitting: Family Medicine

## 2022-08-16 ENCOUNTER — Other Ambulatory Visit: Payer: Self-pay

## 2022-08-16 MED ORDER — TRULICITY 0.75 MG/0.5ML ~~LOC~~ SOAJ
0.7500 mg | SUBCUTANEOUS | 2 refills | Status: DC
  Filled 2022-08-16: qty 2, 28d supply, fill #0
  Filled 2022-10-06 (×2): qty 2, 28d supply, fill #1

## 2022-08-18 ENCOUNTER — Other Ambulatory Visit: Payer: Self-pay

## 2022-08-23 ENCOUNTER — Other Ambulatory Visit: Payer: Self-pay

## 2022-08-27 ENCOUNTER — Other Ambulatory Visit: Payer: Self-pay

## 2022-08-30 ENCOUNTER — Other Ambulatory Visit: Payer: Self-pay

## 2022-09-21 ENCOUNTER — Other Ambulatory Visit: Payer: Self-pay

## 2022-10-01 ENCOUNTER — Ambulatory Visit: Payer: Self-pay | Attending: Nurse Practitioner | Admitting: Nurse Practitioner

## 2022-10-01 ENCOUNTER — Encounter: Payer: Self-pay | Admitting: Nurse Practitioner

## 2022-10-01 ENCOUNTER — Other Ambulatory Visit: Payer: Self-pay

## 2022-10-01 VITALS — BP 118/76 | HR 81 | Ht 72.0 in | Wt 215.0 lb

## 2022-10-01 DIAGNOSIS — I10 Essential (primary) hypertension: Secondary | ICD-10-CM

## 2022-10-01 DIAGNOSIS — E1165 Type 2 diabetes mellitus with hyperglycemia: Secondary | ICD-10-CM

## 2022-10-01 DIAGNOSIS — E785 Hyperlipidemia, unspecified: Secondary | ICD-10-CM

## 2022-10-01 DIAGNOSIS — Z125 Encounter for screening for malignant neoplasm of prostate: Secondary | ICD-10-CM

## 2022-10-01 DIAGNOSIS — Z23 Encounter for immunization: Secondary | ICD-10-CM

## 2022-10-01 DIAGNOSIS — Z0000 Encounter for general adult medical examination without abnormal findings: Secondary | ICD-10-CM

## 2022-10-01 MED ORDER — METFORMIN HCL 1000 MG PO TABS
1000.0000 mg | ORAL_TABLET | Freq: Two times a day (BID) | ORAL | 1 refills | Status: DC
  Filled 2022-10-01: qty 180, 90d supply, fill #0

## 2022-10-01 MED ORDER — ATORVASTATIN CALCIUM 40 MG PO TABS
40.0000 mg | ORAL_TABLET | Freq: Every day | ORAL | 1 refills | Status: DC
  Filled 2022-10-01: qty 30, 30d supply, fill #0

## 2022-10-01 MED ORDER — LISINOPRIL 10 MG PO TABS
10.0000 mg | ORAL_TABLET | Freq: Every day | ORAL | 1 refills | Status: DC
  Filled 2022-10-01: qty 30, 30d supply, fill #0

## 2022-10-01 NOTE — Progress Notes (Signed)
No concerns. 

## 2022-10-01 NOTE — Progress Notes (Signed)
Assessment & Plan:  [name] was seen today for annual exam.  Diagnoses and all orders for this visit:  Encounter for annual physical exam -     Varicella-zoster vaccine IM -     CBC with Differential  Need for shingles vaccine -     Varicella-zoster vaccine IM  Prostate cancer screening -     PSA  Type 2 diabetes mellitus with hyperglycemia, without long-term current use of insulin (HCC) -     Microalbumin / creatinine urine ratio -     Hemoglobin A1c -     metFORMIN (GLUCOPHAGE) 1000 MG tablet; Take 1 tablet (1,000 mg total) by mouth 2 (two) times daily with a meal. Continue blood sugar control as discussed in office today, low carbohydrate diet, and regular physical exercise as tolerated, 150 minutes per week (30 min each day, 5 days per week, or 50 min 3 days per week). Keep blood sugar logs with fasting goal of 90-130 mg/dl, post prandial (after you eat) less than 180.  For Hypoglycemia: BS <60 and Hyperglycemia BS >400; contact the clinic ASAP. Annual eye exams and foot exams are recommended.   Dyslipidemia, goal LDL below 70 -     atorvastatin (LIPITOR) 40 MG tablet; Take 1 tablet (40 mg total) by mouth daily.  Primary hypertension Well controlled.  -     CMP14+EGFR -     lisinopril (ZESTRIL) 10 MG tablet; Take 1 tablet (10 mg total) by mouth daily. Continue all antihypertensives as prescribed.  Reminded to bring in blood pressure log for follow  up appointment.  RECOMMENDATIONS: DASH/Mediterranean Diets are healthier choices for HTN.     Patient has been counseled on age-appropriate routine health concerns for screening and prevention. These are reviewed and up-to-date. Referrals have been placed accordingly. Immunizations are up-to-date or declined.    Subjective:   Chief Complaint  Patient presents with   Annual Exam   HPI Kyle Campos 66 y.o. male presents to office today for annual exam  He is doing well today. Blood pressure is well controlled. He has no  concerns or questions today.   He has a past medical history of Colon polyps (03/14/2015), Diabetes mellitus ([place]), Diverticulosis, Hepatic steatosis, Hyperlipidemia, Hypertension, Lung nodules, and Smoker.   Patient has been counseled on age-appropriate routine health concerns for screening and prevention. These are reviewed and up-to-date. Referrals have been placed accordingly. Immunizations are up-to-date or declined.     Overdue for lung CT screening. Patient has been advised to apply for financial assistance and schedule to see our financial counselor.   COLONOSCOPY: DUE 2025 PSA:  Lab Results  Component Value Date   PSA1 0.3 01/14/2020       Review of Systems  Constitutional:  Negative for fever, malaise/fatigue and weight loss.  HENT: Negative.  Negative for nosebleeds.   Eyes: Negative.  Negative for blurred vision, double vision and photophobia.  Respiratory: Negative.  Negative for cough and shortness of breath.   Cardiovascular: Negative.  Negative for chest pain, palpitations and leg swelling.  Gastrointestinal: Negative.  Negative for heartburn, nausea and vomiting.  Genitourinary: Negative.   Musculoskeletal: Negative.  Negative for myalgias.  Skin: Negative.   Neurological: Negative.  Negative for dizziness, focal weakness, seizures and headaches.  Endo/Heme/Allergies: Negative.   Psychiatric/Behavioral: Negative.  Negative for suicidal ideas.     Past Medical History:  Diagnosis Date   Colon polyps 03/14/2015   Tubular adenoma x 6   Diabetes mellitus ([place])  Diverticulosis    Hepatic steatosis    Hyperlipidemia    Hypertension    Lung nodules    Smoker     Past Surgical History:  Procedure Laterality Date   APPENDECTOMY  at age 44    colonoscopy  03/14/2015    Family History  Problem Relation Age of Onset   Diabetes Mother    Cancer Father        lung cancer    Stroke Maternal Grandfather    Colon polyps Brother    Colon cancer Neg Hx     Esophageal cancer Neg Hx    Rectal cancer Neg Hx    Stomach cancer Neg Hx     Social History Reviewed with no changes to be made today.   Outpatient Medications Prior to Visit  Medication Sig Dispense Refill   Blood Glucose Monitoring Suppl (TRUE METRIX METER) w/Device KIT Monitor blood glucose levels twice per day 1 kit 0   Dulaglutide (TRULICITY) 6.21 HY/8.6VH SOPN Inject 0.75 mg into the skin once a week. 2 mL 2   glucose blood (TRUE METRIX BLOOD GLUCOSE TEST) test strip Monitor blood glucose levels twice per day 100 each 2   naproxen (NAPROSYN) 500 MG tablet Take 1 tablet (500 mg total) by mouth 2 (two) times daily with a meal. 60 tablet 0   TRUEplus Lancets 28G MISC Monitor blood glucose levels twice per day 100 each 2   atorvastatin (LIPITOR) 40 MG tablet TAKE 1 TABLET (40 MG TOTAL) BY MOUTH DAILY. 30 tablet 2   lisinopril (ZESTRIL) 10 MG tablet TAKE 1 TABLET (10 MG TOTAL) BY MOUTH DAILY. 30 tablet 2   metFORMIN (GLUCOPHAGE) 1000 MG tablet TAKE 1 TABLET (1,000 MG TOTAL) BY MOUTH 2 (TWO) TIMES DAILY WITH A MEAL. 60 tablet 2   albuterol (VENTOLIN HFA) 108 (90 Base) MCG/ACT inhaler Inhale 2 puffs into the lungs every 6 (six) hours as needed for wheezing or shortness of breath. (Patient not taking: Reported on 10/01/2022) 6.7 g 2   betamethasone dipropionate 0.05 % cream Apply topically 2 (two) times daily. (Patient not taking: Reported on 10/01/2022) 30 g 0   cyclobenzaprine (FLEXERIL) 10 MG tablet Take 1 tablet (10 mg total) by mouth 3 (three) times daily as needed for muscle spasms. (Patient not taking: Reported on 07/01/2022) 60 tablet 0   No facility-administered medications prior to visit.    No Known Allergies     Objective:    BP 118/76   Pulse 81   Ht 6' (1.829 m)   Wt 215 lb (97.5 kg)   SpO2 98%   BMI 29.16 kg/m  Wt Readings from Last 3 Encounters:  10/01/22 215 lb (97.5 kg)  07/01/22 213 lb 6.4 oz (96.8 kg)  01/06/22 220 lb (99.8 kg)    Physical  Exam Constitutional:      Appearance: He is well-developed.  HENT:     Head: Normocephalic and atraumatic.     Right Ear: Hearing, tympanic membrane, ear canal and external ear normal.     Left Ear: Hearing, tympanic membrane, ear canal and external ear normal.     Nose: Nose normal. No mucosal edema or rhinorrhea.     Right Turbinates: Not enlarged.     Left Turbinates: Not enlarged.     Mouth/Throat:     Lips: Pink.     Mouth: Mucous membranes are moist.     Dentition: No gingival swelling, dental abscesses or gum lesions.     Pharynx: Uvula  midline.     Tonsils: No tonsillar exudate. 1+ on the right. 1+ on the left.  Eyes:     General: Lids are normal. No scleral icterus.    Extraocular Movements: Extraocular movements intact.     Conjunctiva/sclera: Conjunctivae normal.     Pupils: Pupils are equal, round, and reactive to light.  Neck:     Thyroid: No thyromegaly.     Trachea: No tracheal deviation.  Cardiovascular:     Rate and Rhythm: Normal rate and regular rhythm.     Heart sounds: Normal heart sounds. No murmur heard.    No friction rub. No gallop.  Pulmonary:     Effort: Pulmonary effort is normal. No respiratory distress.     Breath sounds: Normal breath sounds. No wheezing or rales.  Chest:     Chest wall: No mass or tenderness.  Breasts:    Right: No inverted nipple, mass, nipple discharge, skin change or tenderness.     Left: No inverted nipple, mass, nipple discharge, skin change or tenderness.  Abdominal:     General: Bowel sounds are normal. There is no distension.     Palpations: Abdomen is soft. There is no mass.     Tenderness: There is no abdominal tenderness. There is no guarding or rebound.  Musculoskeletal:        General: No tenderness or deformity. Normal range of motion.     Cervical back: Normal range of motion and neck supple.  Lymphadenopathy:     Cervical: No cervical adenopathy.  Skin:    General: Skin is warm and dry.     Capillary  Refill: Capillary refill takes less than 2 seconds.     Findings: No erythema.  Neurological:     Mental Status: He is alert and oriented to person, place, and time.     Cranial Nerves: No cranial nerve deficit.     Sensory: Sensation is intact.     Motor: No abnormal muscle tone.     Coordination: Coordination is intact. Coordination normal.     Gait: Gait is intact.     Deep Tendon Reflexes: Reflexes normal.     Reflex Scores:      Patellar reflexes are 1+ on the right side and 1+ on the left side. Psychiatric:        Attention and Perception: Attention normal.        Mood and Affect: Mood normal.        Speech: Speech normal.        Behavior: Behavior normal.        Thought Content: Thought content normal.        Judgment: Judgment normal.          Patient has been counseled extensively about nutrition and exercise as well as the importance of adherence with medications and regular follow-up. The patient was given clear instructions to go to ER or return to medical center if symptoms don't improve, worsen or new problems develop. The patient verbalized understanding.   Follow-up: Return in about 3 months (around 12/31/2022).   Gildardo Pounds, FNP-BC [facility] [place] [facility] [place], [place]   10/01/2022, 2:28 PM

## 2022-10-02 LAB — CBC WITH DIFFERENTIAL/PLATELET
Basophils Absolute: 0.1 10*3/uL (ref 0.0–0.2)
Basos: 1 %
EOS (ABSOLUTE): 0.2 10*3/uL (ref 0.0–0.4)
Eos: 3 %
Hematocrit: 47.4 % (ref 37.5–51.0)
Hemoglobin: 15.9 g/dL (ref 13.0–17.7)
Immature Grans (Abs): 0 10*3/uL (ref 0.0–0.1)
Immature Granulocytes: 1 %
Lymphocytes Absolute: 1.3 10*3/uL (ref 0.7–3.1)
Lymphs: 20 %
MCH: 29.8 pg (ref 26.6–33.0)
MCHC: 33.5 g/dL (ref 31.5–35.7)
MCV: 89 fL (ref 79–97)
Monocytes Absolute: 0.4 10*3/uL (ref 0.1–0.9)
Monocytes: 7 %
Neutrophils Absolute: 4.6 10*3/uL (ref 1.4–7.0)
Neutrophils: 68 %
Platelets: 276 10*3/uL (ref 150–450)
RBC: 5.33 x10E6/uL (ref 4.14–5.80)
RDW: 13 % (ref 11.6–15.4)
WBC: 6.6 10*3/uL (ref 3.4–10.8)

## 2022-10-02 LAB — CMP14+EGFR
ALT: 29 IU/L (ref 0–44)
AST: 17 IU/L (ref 0–40)
Albumin/Globulin Ratio: 2 (ref 1.2–2.2)
Albumin: 5.1 g/dL — ABNORMAL HIGH (ref 3.9–4.9)
Alkaline Phosphatase: 60 IU/L (ref 44–121)
BUN/Creatinine Ratio: 27 — ABNORMAL HIGH (ref 10–24)
BUN: 24 mg/dL (ref 8–27)
Bilirubin Total: 0.4 mg/dL (ref 0.0–1.2)
CO2: 27 mmol/L (ref 20–29)
Calcium: 10.7 mg/dL — ABNORMAL HIGH (ref 8.6–10.2)
Chloride: 99 mmol/L (ref 96–106)
Creatinine, Ser: 0.88 mg/dL (ref 0.76–1.27)
Globulin, Total: 2.5 g/dL (ref 1.5–4.5)
Glucose: 149 mg/dL — ABNORMAL HIGH (ref 70–99)
Potassium: 4.9 mmol/L (ref 3.5–5.2)
Sodium: 138 mmol/L (ref 134–144)
Total Protein: 7.6 g/dL (ref 6.0–8.5)
eGFR: 96 mL/min/{1.73_m2} (ref >59)

## 2022-10-02 LAB — PSA: Prostate Specific Ag, Serum: 0.5 ng/mL (ref 0.0–4.0)

## 2022-10-02 LAB — MICROALBUMIN / CREATININE URINE RATIO
Creatinine, Urine: 85.1 mg/dL
Microalb/Creat Ratio: 55 mg/g creat — ABNORMAL HIGH (ref 0–29)
Microalbumin, Urine: 46.9 ug/mL

## 2022-10-02 LAB — HEMOGLOBIN A1C
Est. average glucose Bld gHb Est-mCnc: 160 mg/dL
Hgb A1c MFr Bld: 7.2 % — ABNORMAL HIGH (ref 4.8–5.6)

## 2022-10-06 ENCOUNTER — Other Ambulatory Visit: Payer: Self-pay | Admitting: Family Medicine

## 2022-10-06 ENCOUNTER — Other Ambulatory Visit: Payer: Self-pay

## 2022-10-06 DIAGNOSIS — M542 Cervicalgia: Secondary | ICD-10-CM

## 2022-10-06 DIAGNOSIS — E785 Hyperlipidemia, unspecified: Secondary | ICD-10-CM

## 2022-10-06 DIAGNOSIS — I10 Essential (primary) hypertension: Secondary | ICD-10-CM

## 2022-10-06 DIAGNOSIS — E1165 Type 2 diabetes mellitus with hyperglycemia: Secondary | ICD-10-CM

## 2022-10-06 MED ORDER — METFORMIN HCL 1000 MG PO TABS
1000.0000 mg | ORAL_TABLET | Freq: Two times a day (BID) | ORAL | 1 refills | Status: DC
  Filled 2023-01-21: qty 60, 30d supply, fill #0
  Filled 2023-04-12: qty 60, 30d supply, fill #1
  Filled 2023-07-07: qty 60, 30d supply, fill #2

## 2022-10-06 MED ORDER — NAPROXEN 500 MG PO TABS
500.0000 mg | ORAL_TABLET | Freq: Two times a day (BID) | ORAL | 0 refills | Status: DC
  Filled 2022-10-06: qty 60, 30d supply, fill #0
  Filled 2022-10-07: qty 180, 90d supply, fill #0

## 2022-10-06 MED ORDER — LISINOPRIL 10 MG PO TABS
10.0000 mg | ORAL_TABLET | Freq: Every day | ORAL | 1 refills | Status: DC
  Filled 2022-11-15: qty 30, 30d supply, fill #0
  Filled 2022-12-27: qty 30, 30d supply, fill #1
  Filled 2023-01-21: qty 30, 30d supply, fill #2
  Filled 2023-03-08: qty 30, 30d supply, fill #3
  Filled 2023-04-12: qty 30, 30d supply, fill #4
  Filled 2023-05-24: qty 30, 30d supply, fill #5

## 2022-10-06 MED ORDER — TRULICITY 0.75 MG/0.5ML ~~LOC~~ SOAJ
0.7500 mg | SUBCUTANEOUS | 1 refills | Status: DC
  Filled 2022-11-17: qty 2, 28d supply, fill #0
  Filled 2023-01-21 (×2): qty 2, 28d supply, fill #1
  Filled 2023-02-24 – 2023-02-25 (×2): qty 2, 28d supply, fill #2
  Filled 2023-04-20 – 2023-05-10 (×2): qty 2, 28d supply, fill #3
  Filled 2023-07-06: qty 2, 28d supply, fill #4

## 2022-10-06 MED ORDER — ATORVASTATIN CALCIUM 40 MG PO TABS
40.0000 mg | ORAL_TABLET | Freq: Every day | ORAL | 1 refills | Status: DC
  Filled 2022-11-15: qty 30, 30d supply, fill #0
  Filled 2022-12-27: qty 30, 30d supply, fill #1
  Filled 2023-01-21: qty 30, 30d supply, fill #2
  Filled 2023-03-08: qty 30, 30d supply, fill #3
  Filled 2023-04-12: qty 30, 30d supply, fill #4
  Filled 2023-05-24: qty 30, 30d supply, fill #5

## 2022-10-06 NOTE — Telephone Encounter (Signed)
Requested Prescriptions  Pending Prescriptions Disp Refills   Dulaglutide (TRULICITY) 3.88 EK/8.0KL SOPN 2 mL 2    Sig: Inject 0.75 mg into the skin once a week.     Endocrinology:  Diabetes - GLP-1 Receptor Agonists Passed - 10/06/2022  4:45 PM      Passed - HBA1C is between 0 and 7.9 and within 180 days    HbA1c, POC (controlled diabetic range)  Date Value Ref Range Status  07/01/2022 7.0 0.0 - 7.0 % Final   Hgb A1c MFr Bld  Date Value Ref Range Status  10/01/2022 7.2 (H) 4.8 - 5.6 % Final    Comment:             Prediabetes: 5.7 - 6.4          Diabetes: >6.4          Glycemic control for adults with diabetes: <7.0          Passed - Valid encounter within last 6 months    Recent Outpatient Visits           5 days ago Encounter for annual physical exam   [place], [place] W, NP   3 months ago Type 2 diabetes mellitus with hyperglycemia, without long-term current use of insulin [facility])   [place], Vernia Buff, NP   9 months ago Essential hypertension   [place], [place] W, NP   1 year ago Type 2 diabetes mellitus with hyperglycemia, without long-term current use of insulin [facility])   [place], [place] W, NP   1 year ago Type 2 diabetes mellitus with hyperglycemia, without long-term current use of insulin [facility])   [place], Vernia Buff, NP       Future Appointments             In 2 months Gildardo Pounds, NP [place]             atorvastatin (LIPITOR) 40 MG tablet 90 tablet 1    Sig: Take 1 tablet (40 mg total) by mouth daily.     Cardiovascular:  Antilipid - Statins Failed - 10/06/2022  4:45 PM      Failed - Lipid Panel in normal range within the last 12 months    Cholesterol, Total  Date Value Ref Range Status  07/01/2022 115 100 - 199 mg/dL Final    LDL Chol Calc (NIH)  Date Value Ref Range Status  07/01/2022 53 0 - 99 mg/dL Final   HDL  Date Value Ref Range Status  07/01/2022 34 (L) >39 mg/dL Final   Triglycerides  Date Value Ref Range Status  07/01/2022 162 (H) 0 - 149 mg/dL Final         Passed - Patient is not pregnant      Passed - Valid encounter within last 12 months    Recent Outpatient Visits           5 days ago Encounter for annual physical exam   [place], [place] W, NP   3 months ago Type 2 diabetes mellitus with hyperglycemia, without long-term current use of insulin [facility])   [place], Vernia Buff, NP   9 months ago Essential hypertension   [place], Vernia Buff, NP  1 year ago Type 2 diabetes mellitus with hyperglycemia, without long-term current use of insulin [facility])   [place], [place] W, NP   1 year ago Type 2 diabetes mellitus with hyperglycemia, without long-term current use of insulin [facility])   [place], Vernia Buff, NP       Future Appointments             In 2 months Gildardo Pounds, NP [place]             naproxen (NAPROSYN) 500 MG tablet 60 tablet 0    Sig: Take 1 tablet (500 mg total) by mouth 2 (two) times daily with a meal.     Analgesics:  NSAIDS Failed - 10/06/2022  4:45 PM      Failed - Manual Review: Labs are only required if the patient has taken medication for more than 8 weeks.      Passed - Cr in normal range and within 360 days    Creat  Date Value Ref Range Status  01/02/2015 0.80 0.50 - 1.35 mg/dL Final   Creatinine, Ser  Date Value Ref Range Status  10/01/2022 0.88 0.76 - 1.27 mg/dL Final   Creatinine, Urine  Date Value Ref Range Status  09/14/2016 145 20 - 370 mg/dL Final         Passed - HGB in normal range and within 360 days     Hemoglobin  Date Value Ref Range Status  10/01/2022 15.9 13.0 - 17.7 g/dL Final         Passed - PLT in normal range and within 360 days    Platelets  Date Value Ref Range Status  10/01/2022 276 150 - 450 x10E3/uL Final         Passed - HCT in normal range and within 360 days    Hematocrit  Date Value Ref Range Status  10/01/2022 47.4 37.5 - 51.0 % Final         Passed - eGFR is 30 or above and within 360 days    GFR, Est African American  Date Value Ref Range Status  01/02/2015 >89 mL/min Final   GFR calc Af Amer  Date Value Ref Range Status  06/20/2020 107 >59 mL/min/1.73 Final    Comment:    **[facility] currently reports eGFR in compliance with the current**   recommendations of the [employer]. [facility] will   update reporting as new guidelines are published from the NKF-ASN   Task force.    GFR, Est Non African American  Date Value Ref Range Status  01/02/2015 >89 mL/min Final    Comment:      The estimated GFR is a calculation valid for adults (>=66 years old) that uses the CKD-EPI algorithm to adjust for age and sex. It is   not to be used for children, pregnant women, hospitalized patients,    patients on dialysis, or with rapidly changing kidney function. According to the NKDEP, eGFR >89 is normal, 60-89 shows mild impairment, 30-59 shows moderate impairment, 15-29 shows severe impairment and <15 is ESRD.      GFR calc non Af Amer  Date Value Ref Range Status  06/20/2020 93 >59 mL/min/1.73 Final   GFR  Date Value Ref Range Status  09/23/2015 83.73 >60.00 mL/min Final   eGFR  Date Value Ref Range Status  10/01/2022 96 >59 mL/min/1.73 Final  Passed - Patient is not pregnant      Passed - Valid encounter within last 12 months    Recent Outpatient Visits           5 days ago Encounter for annual physical exam   [place], [place] W, NP   3 months ago Type 2 diabetes mellitus with  hyperglycemia, without long-term current use of insulin [facility])   [place], Vernia Buff, NP   9 months ago Essential hypertension   [place], [place] W, NP   1 year ago Type 2 diabetes mellitus with hyperglycemia, without long-term current use of insulin [facility])   [place], [place] W, NP   1 year ago Type 2 diabetes mellitus with hyperglycemia, without long-term current use of insulin [facility])   [place], Vernia Buff, NP       Future Appointments             In 2 months Gildardo Pounds, NP [place]             lisinopril (ZESTRIL) 10 MG tablet 90 tablet 1    Sig: Take 1 tablet (10 mg total) by mouth daily.     Cardiovascular:  ACE Inhibitors Passed - 10/06/2022  4:45 PM      Passed - Cr in normal range and within 180 days    Creat  Date Value Ref Range Status  01/02/2015 0.80 0.50 - 1.35 mg/dL Final   Creatinine, Ser  Date Value Ref Range Status  10/01/2022 0.88 0.76 - 1.27 mg/dL Final   Creatinine, Urine  Date Value Ref Range Status  09/14/2016 145 20 - 370 mg/dL Final         Passed - K in normal range and within 180 days    Potassium  Date Value Ref Range Status  10/01/2022 4.9 3.5 - 5.2 mmol/L Final         Passed - Patient is not pregnant      Passed - Last BP in normal range    BP Readings from Last 1 Encounters:  10/01/22 118/76         Passed - Valid encounter within last 6 months    Recent Outpatient Visits           5 days ago Encounter for annual physical exam   [place], [place] W, NP   3 months ago Type 2 diabetes mellitus with hyperglycemia, without long-term current use of insulin [facility])   [place], Vernia Buff, NP   9 months ago Essential hypertension   [place], [place] W, NP   1 year ago Type 2 diabetes mellitus with hyperglycemia, without long-term current use of insulin [facility])   [place], [place] W, NP   1 year ago Type 2 diabetes mellitus with hyperglycemia, without long-term current use of insulin [facility])   [place], Zelda W, NP       Future Appointments             In 2 months Gildardo Pounds, NP [place]             metFORMIN (GLUCOPHAGE) 1000 MG tablet  180 tablet 1    Sig: Take 1 tablet (1,000 mg total) by mouth 2 (two) times daily with a meal.     Endocrinology:  Diabetes - Biguanides Failed - 10/06/2022  4:45 PM      Failed - B12 Level in normal range and within 720 days    No results found for: "VITAMINB12"       Passed - Cr in normal range and within 360 days    Creat  Date Value Ref Range Status  01/02/2015 0.80 0.50 - 1.35 mg/dL Final   Creatinine, Ser  Date Value Ref Range Status  10/01/2022 0.88 0.76 - 1.27 mg/dL Final   Creatinine, Urine  Date Value Ref Range Status  09/14/2016 145 20 - 370 mg/dL Final         Passed - HBA1C is between 0 and 7.9 and within 180 days    HbA1c, POC (controlled diabetic range)  Date Value Ref Range Status  07/01/2022 7.0 0.0 - 7.0 % Final   Hgb A1c MFr Bld  Date Value Ref Range Status  10/01/2022 7.2 (H) 4.8 - 5.6 % Final    Comment:             Prediabetes: 5.7 - 6.4          Diabetes: >6.4          Glycemic control for adults with diabetes: <7.0          Passed - eGFR in normal range and within 360 days    GFR, Est African American  Date Value Ref Range Status  01/02/2015 >89 mL/min Final   GFR calc Af Amer  Date Value Ref Range Status  06/20/2020 107 >59 mL/min/1.73 Final    Comment:    **[facility] currently reports eGFR in compliance with the current**   recommendations of the [employer]. [facility] will   update reporting as  new guidelines are published from the NKF-ASN   Task force.    GFR, Est Non African American  Date Value Ref Range Status  01/02/2015 >89 mL/min Final    Comment:      The estimated GFR is a calculation valid for adults (>=30 years old) that uses the CKD-EPI algorithm to adjust for age and sex. It is   not to be used for children, pregnant women, hospitalized patients,    patients on dialysis, or with rapidly changing kidney function. According to the NKDEP, eGFR >89 is normal, 60-89 shows mild impairment, 30-59 shows moderate impairment, 15-29 shows severe impairment and <15 is ESRD.      GFR calc non Af Amer  Date Value Ref Range Status  06/20/2020 93 >59 mL/min/1.73 Final   GFR  Date Value Ref Range Status  09/23/2015 83.73 >60.00 mL/min Final   eGFR  Date Value Ref Range Status  10/01/2022 96 >59 mL/min/1.73 Final         Passed - Valid encounter within last 6 months    Recent Outpatient Visits           5 days ago Encounter for annual physical exam   [place], [place] W, NP   3 months ago Type 2 diabetes mellitus with hyperglycemia, without long-term current use of insulin [facility])   [place], Vernia Buff, NP   9 months ago Essential hypertension   [place], Vernia Buff, NP   1 year ago Type 2  diabetes mellitus with hyperglycemia, without long-term current use of insulin ([place])   [place], Vernia Buff, NP   1 year ago Type 2 diabetes mellitus with hyperglycemia, without long-term current use of insulin ([place])   [place], Vernia Buff, NP       Future Appointments             In 2 months Gildardo Pounds, NP [place] - CBC within normal limits and completed in the last 12 months    WBC  Date Value Ref Range Status   10/01/2022 6.6 3.4 - 10.8 x10E3/uL Final  09/23/2015 8.9 4.0 - 10.5 K/uL Final   RBC  Date Value Ref Range Status  10/01/2022 5.33 4.14 - 5.80 x10E6/uL Final  09/23/2015 5.13 4.22 - 5.81 Mil/uL Final   Hemoglobin  Date Value Ref Range Status  10/01/2022 15.9 13.0 - 17.7 g/dL Final   Hematocrit  Date Value Ref Range Status  10/01/2022 47.4 37.5 - 51.0 % Final   MCHC  Date Value Ref Range Status  10/01/2022 33.5 31.5 - 35.7 g/dL Final  09/23/2015 33.8 30.0 - 36.0 g/dL Final   [facility]  Date Value Ref Range Status  10/01/2022 29.8 26.6 - 33.0 pg Final  01/02/2015 30.8 26.0 - 34.0 pg Final   MCV  Date Value Ref Range Status  10/01/2022 89 79 - 97 fL Final   No results found for: "PLTCOUNTKUC", "LABPLAT", "POCPLA" RDW  Date Value Ref Range Status  10/01/2022 13.0 11.6 - 15.4 % Final

## 2022-10-06 NOTE — Telephone Encounter (Signed)
Pt given lab results per notes of Zelda, NP on 10/06/22. Pt verbalized understanding. Pt states he has been trying to call in refills to pharmacy but unable to get thru. Reviewed medications needing refills and advised I would send requests over and if I was able to refill would go ahead and send to pharmacy if not send to provider. Pt verbalized understanding.

## 2022-10-06 NOTE — Telephone Encounter (Signed)
Resending to pharmacy d/t unsure if pharmacy received or not and tried calling but d/t long hold time unable to speak with [place]. Pt has been trying to call as well and unable to get thru.   Requested Prescriptions  Pending Prescriptions Disp Refills   atorvastatin (LIPITOR) 40 MG tablet 90 tablet 1    Sig: Take 1 tablet (40 mg total) by mouth daily.     Cardiovascular:  Antilipid - Statins Failed - 10/06/2022  4:45 PM      Failed - Lipid Panel in normal range within the last 12 months    Cholesterol, Total  Date Value Ref Range Status  07/01/2022 115 100 - 199 mg/dL Final   LDL Chol Calc (NIH)  Date Value Ref Range Status  07/01/2022 53 0 - 99 mg/dL Final   HDL  Date Value Ref Range Status  07/01/2022 34 (L) >39 mg/dL Final   Triglycerides  Date Value Ref Range Status  07/01/2022 162 (H) 0 - 149 mg/dL Final         Passed - Patient is not pregnant      Passed - Valid encounter within last 12 months    Recent Outpatient Visits           5 days ago Encounter for annual physical exam   [place], [place] W, NP   3 months ago Type 2 diabetes mellitus with hyperglycemia, without long-term current use of insulin [facility])   [place], Vernia Buff, NP   9 months ago Essential hypertension   [place], [place] W, NP   1 year ago Type 2 diabetes mellitus with hyperglycemia, without long-term current use of insulin [facility])   [place], [place] W, NP   1 year ago Type 2 diabetes mellitus with hyperglycemia, without long-term current use of insulin [facility])   [place], Vernia Buff, NP       Future Appointments             In 2 months Gildardo Pounds, NP [place]             lisinopril (ZESTRIL) 10 MG tablet 90 tablet 1    Sig: Take 1 tablet (10 mg total) by mouth  daily.     Cardiovascular:  ACE Inhibitors Passed - 10/06/2022  4:45 PM      Passed - Cr in normal range and within 180 days    Creat  Date Value Ref Range Status  01/02/2015 0.80 0.50 - 1.35 mg/dL Final   Creatinine, Ser  Date Value Ref Range Status  10/01/2022 0.88 0.76 - 1.27 mg/dL Final   Creatinine, Urine  Date Value Ref Range Status  09/14/2016 145 20 - 370 mg/dL Final         Passed - K in normal range and within 180 days    Potassium  Date Value Ref Range Status  10/01/2022 4.9 3.5 - 5.2 mmol/L Final         Passed - Patient is not pregnant      Passed - Last BP in normal range    BP Readings from Last 1 Encounters:  10/01/22 118/76         Passed - Valid encounter within last 6 months    Recent Outpatient Visits  5 days ago Encounter for annual physical exam   [place], [place] W, NP   3 months ago Type 2 diabetes mellitus with hyperglycemia, without long-term current use of insulin [facility])   [place], Vernia Buff, NP   9 months ago Essential hypertension   [place], Vernia Buff, NP   1 year ago Type 2 diabetes mellitus with hyperglycemia, without long-term current use of insulin ([place])   [place], Vernia Buff, NP   1 year ago Type 2 diabetes mellitus with hyperglycemia, without long-term current use of insulin ([place])   [place], Vernia Buff, NP       Future Appointments             In 2 months Gildardo Pounds, NP [place]             metFORMIN (GLUCOPHAGE) 1000 MG tablet 180 tablet 1    Sig: Take 1 tablet (1,000 mg total) by mouth 2 (two) times daily with a meal.     Endocrinology:  Diabetes - Biguanides Failed - 10/06/2022  4:45 PM      Failed - B12 Level in normal range and within 720 days    No results found for:  "VITAMINB12"       Passed - Cr in normal range and within 360 days    Creat  Date Value Ref Range Status  01/02/2015 0.80 0.50 - 1.35 mg/dL Final   Creatinine, Ser  Date Value Ref Range Status  10/01/2022 0.88 0.76 - 1.27 mg/dL Final   Creatinine, Urine  Date Value Ref Range Status  09/14/2016 145 20 - 370 mg/dL Final         Passed - HBA1C is between 0 and 7.9 and within 180 days    HbA1c, POC (controlled diabetic range)  Date Value Ref Range Status  07/01/2022 7.0 0.0 - 7.0 % Final   Hgb A1c MFr Bld  Date Value Ref Range Status  10/01/2022 7.2 (H) 4.8 - 5.6 % Final    Comment:             Prediabetes: 5.7 - 6.4          Diabetes: >6.4          Glycemic control for adults with diabetes: <7.0          Passed - eGFR in normal range and within 360 days    GFR, Est African American  Date Value Ref Range Status  01/02/2015 >89 mL/min Final   GFR calc Af Amer  Date Value Ref Range Status  06/20/2020 107 >59 mL/min/1.73 Final    Comment:    **[facility] currently reports eGFR in compliance with the current**   recommendations of the [employer]. [facility] will   update reporting as new guidelines are published from the NKF-ASN   Task force.    GFR, Est Non African American  Date Value Ref Range Status  01/02/2015 >89 mL/min Final    Comment:      The estimated GFR is a calculation valid for adults (>=60 years old) that uses the CKD-EPI algorithm to adjust for age and sex. It is   not to be used for children, pregnant women, hospitalized patients,    patients on dialysis, or with rapidly changing kidney function. According to the NKDEP, eGFR >  89 is normal, 60-89 shows mild impairment, 30-59 shows moderate impairment, 15-29 shows severe impairment and <15 is ESRD.      GFR calc non Af Amer  Date Value Ref Range Status  06/20/2020 93 >59 mL/min/1.73 Final   GFR  Date Value Ref Range Status  09/23/2015 83.73 >60.00 mL/min Final   eGFR  Date Value  Ref Range Status  10/01/2022 96 >59 mL/min/1.73 Final         Passed - Valid encounter within last 6 months    Recent Outpatient Visits           5 days ago Encounter for annual physical exam   [place], Vernia Buff, NP   3 months ago Type 2 diabetes mellitus with hyperglycemia, without long-term current use of insulin ([place])   [place], Vernia Buff, NP   9 months ago Essential hypertension   [place], Vernia Buff, NP   1 year ago Type 2 diabetes mellitus with hyperglycemia, without long-term current use of insulin ([place])   [place], Vernia Buff, NP   1 year ago Type 2 diabetes mellitus with hyperglycemia, without long-term current use of insulin ([place])   [place], Vernia Buff, NP       Future Appointments             In 2 months Gildardo Pounds, NP [place] - CBC within normal limits and completed in the last 12 months    WBC  Date Value Ref Range Status  10/01/2022 6.6 3.4 - 10.8 x10E3/uL Final  09/23/2015 8.9 4.0 - 10.5 K/uL Final   RBC  Date Value Ref Range Status  10/01/2022 5.33 4.14 - 5.80 x10E6/uL Final  09/23/2015 5.13 4.22 - 5.81 Mil/uL Final   Hemoglobin  Date Value Ref Range Status  10/01/2022 15.9 13.0 - 17.7 g/dL Final   Hematocrit  Date Value Ref Range Status  10/01/2022 47.4 37.5 - 51.0 % Final   MCHC  Date Value Ref Range Status  10/01/2022 33.5 31.5 - 35.7 g/dL Final  09/23/2015 33.8 30.0 - 36.0 g/dL Final   [facility]  Date Value Ref Range Status  10/01/2022 29.8 26.6 - 33.0 pg Final  01/02/2015 30.8 26.0 - 34.0 pg Final   MCV  Date Value Ref Range Status  10/01/2022 89 79 - 97 fL Final   No results found for: "PLTCOUNTKUC", "LABPLAT", "POCPLA" RDW  Date Value Ref Range Status  10/01/2022 13.0 11.6 - 15.4 %  Final         Signed Prescriptions Disp Refills   Dulaglutide (TRULICITY) 6.16 WV/3.7TG SOPN 6 mL 1    Sig: Inject 0.75 mg into the skin once a week.     Endocrinology:  Diabetes - GLP-1 Receptor Agonists Passed - 10/06/2022  4:45 PM      Passed - HBA1C is between 0 and 7.9 and within 180 days    HbA1c, POC (controlled diabetic range)  Date Value Ref Range Status  07/01/2022 7.0 0.0 - 7.0 % Final   Hgb A1c MFr Bld  Date Value Ref Range Status  10/01/2022 7.2 (H) 4.8 - 5.6 % Final    Comment:             Prediabetes: 5.7 - 6.4  Diabetes: >6.4          Glycemic control for adults with diabetes: <7.0          Passed - Valid encounter within last 6 months    Recent Outpatient Visits           5 days ago Encounter for annual physical exam   [place], [place] W, NP   3 months ago Type 2 diabetes mellitus with hyperglycemia, without long-term current use of insulin [facility])   [place], Vernia Buff, NP   9 months ago Essential hypertension   [place], Vernia Buff, NP   1 year ago Type 2 diabetes mellitus with hyperglycemia, without long-term current use of insulin [facility])   [place], Vernia Buff, NP   1 year ago Type 2 diabetes mellitus with hyperglycemia, without long-term current use of insulin [facility])   [place], Vernia Buff, NP       Future Appointments             In 2 months Gildardo Pounds, NP [place]             naproxen (NAPROSYN) 500 MG tablet 180 tablet 0    Sig: Take 1 tablet (500 mg total) by mouth 2 (two) times daily with a meal.     Analgesics:  NSAIDS Failed - 10/06/2022  4:45 PM      Failed - Manual Review: Labs are only required if the patient has taken medication for more than 8 weeks.      Passed - Cr in normal range and within  360 days    Creat  Date Value Ref Range Status  01/02/2015 0.80 0.50 - 1.35 mg/dL Final   Creatinine, Ser  Date Value Ref Range Status  10/01/2022 0.88 0.76 - 1.27 mg/dL Final   Creatinine, Urine  Date Value Ref Range Status  09/14/2016 145 20 - 370 mg/dL Final         Passed - HGB in normal range and within 360 days    Hemoglobin  Date Value Ref Range Status  10/01/2022 15.9 13.0 - 17.7 g/dL Final         Passed - PLT in normal range and within 360 days    Platelets  Date Value Ref Range Status  10/01/2022 276 150 - 450 x10E3/uL Final         Passed - HCT in normal range and within 360 days    Hematocrit  Date Value Ref Range Status  10/01/2022 47.4 37.5 - 51.0 % Final         Passed - eGFR is 30 or above and within 360 days    GFR, Est African American  Date Value Ref Range Status  01/02/2015 >89 mL/min Final   GFR calc Af Amer  Date Value Ref Range Status  06/20/2020 107 >59 mL/min/1.73 Final    Comment:    **[facility] currently reports eGFR in compliance with the current**   recommendations of the [employer]. [facility] will   update reporting as new guidelines are published from the NKF-ASN   Task force.    GFR, Est Non African American  Date Value Ref Range Status  01/02/2015 >89 mL/min Final    Comment:      The estimated GFR is a calculation valid for  adults (>=60 years old) that uses the CKD-EPI algorithm to adjust for age and sex. It is   not to be used for children, pregnant women, hospitalized patients,    patients on dialysis, or with rapidly changing kidney function. According to the NKDEP, eGFR >89 is normal, 60-89 shows mild impairment, 30-59 shows moderate impairment, 15-29 shows severe impairment and <15 is ESRD.      GFR calc non Af Amer  Date Value Ref Range Status  06/20/2020 93 >59 mL/min/1.73 Final   GFR  Date Value Ref Range Status  09/23/2015 83.73 >60.00 mL/min Final   eGFR  Date Value Ref Range Status   10/01/2022 96 >59 mL/min/1.73 Final         Passed - Patient is not pregnant      Passed - Valid encounter within last 12 months    Recent Outpatient Visits           5 days ago Encounter for annual physical exam   [place], [place] W, NP   3 months ago Type 2 diabetes mellitus with hyperglycemia, without long-term current use of insulin [facility])   [place], Vernia Buff, NP   9 months ago Essential hypertension   [place], [place] W, NP   1 year ago Type 2 diabetes mellitus with hyperglycemia, without long-term current use of insulin [facility])   [place], [place] W, NP   1 year ago Type 2 diabetes mellitus with hyperglycemia, without long-term current use of insulin [facility])   [place], Vernia Buff, NP       Future Appointments             In 2 months Gildardo Pounds, NP [place]

## 2022-10-07 ENCOUNTER — Other Ambulatory Visit: Payer: Self-pay

## 2022-10-08 ENCOUNTER — Other Ambulatory Visit: Payer: Self-pay

## 2022-10-20 ENCOUNTER — Ambulatory Visit: Payer: Self-pay

## 2022-10-20 NOTE — Telephone Encounter (Signed)
  Chief Complaint: COVID positive  Symptoms: body aches, HA, sore throat, cough and congestion  Frequency: Monday Pertinent Negatives: Patient denies SOB or fever  Disposition: '[]'$ ED /'[]'$ Urgent Care (no appt availability in office) / '[]'$ Appointment(In office/virtual)/ '[]'$  [facility]/ '[]'$ Home Care/ '[]'$ Refused Recommended Disposition /'[x]'$ [place] [facility]/ '[]'$  Follow-up with PCP Additional Notes: pt took home test yesterday and positive for COVID. Pt wanting antiviral for sx. Offered UC but pt unable to afford to go there. Offered [facility] but pt unable to get there today, sent Cari, PA a secure EPIC message to see if she can schedule pt for a VV d/t being COVID positive, advised pt of care advice and that if I got response back from [place] I would call him back. Pt verbalized understanding.   Summary: Achiness, congestion in chest   The patient called in stating he woke up feeling bad on Monday with headache, achiness, sore throat, cough, and congestion. He did state he just tested positive for covid. His provider does not have any available appts anytime soon. Please assist patient further     Reason for Disposition  [1] COVID-19 diagnosed by positive lab test (e.g., PCR, rapid self-test kit) AND [2] mild symptoms (e.g., cough, fever, others) AND [1] no complications or SOB  Answer Assessment - Initial Assessment Questions 1. COVID-19 DIAGNOSIS: "How do you know that you have COVID?" (e.g., positive lab test or self-test, diagnosed by doctor or NP/PA, symptoms after exposure).     Home test yesterday  3. ONSET: "When did the COVID-19 symptoms start?"      Monday  5. COUGH: "Do you have a cough?" If Yes, ask: "How bad is the cough?"       yes 6. FEVER: "Do you have a fever?" If Yes, ask: "What is your temperature, how was it measured, and when did it start?"     no 7. RESPIRATORY STATUS: "Describe your breathing?" (e.g., normal; shortness of breath, wheezing, unable to speak)      Normal   9. OTHER SYMPTOMS: "Do you have any other symptoms?"  (e.g., chills, fatigue, headache, loss of smell or taste, muscle pain, sore throat)     Body aches, HA, sore throat, congestion, sore throat  10. HIGH RISK DISEASE: "Do you have any chronic medical problems?" (e.g., asthma, heart or lung disease, weak immune system, obesity, etc.)       no 11. VACCINE: "Have you had the COVID-19 vaccine?" If Yes, ask: "Which one, how many shots, when did you get it?"       Yes  Protocols used: Coronavirus (COVID-19) Diagnosed or Suspected-A-AH

## 2022-10-21 ENCOUNTER — Telehealth: Payer: Self-pay

## 2022-10-21 NOTE — Telephone Encounter (Signed)
Received a Message thread  from Rosita Fire, RN stating patient was COVID positive and requesting VV with Cari, PA [facility]. I contacted patient, and he states he is feeling better, and declines a VV or antiviral medication at this time.  Provider was and staff has been update on patients decision.

## 2022-10-26 ENCOUNTER — Other Ambulatory Visit: Payer: Self-pay

## 2022-10-26 ENCOUNTER — Ambulatory Visit: Payer: Self-pay

## 2022-10-26 ENCOUNTER — Telehealth: Payer: Self-pay | Admitting: Physician Assistant

## 2022-10-26 VITALS — BP 131/81 | HR 77 | Ht 75.0 in | Wt 215.0 lb

## 2022-10-26 DIAGNOSIS — U071 COVID-19: Secondary | ICD-10-CM

## 2022-10-26 MED ORDER — PREDNISONE 20 MG PO TABS
ORAL_TABLET | ORAL | 0 refills | Status: AC
  Filled 2022-10-26: qty 13, 8d supply, fill #0

## 2022-10-26 MED ORDER — BENZONATATE 200 MG PO CAPS
200.0000 mg | ORAL_CAPSULE | Freq: Two times a day (BID) | ORAL | 0 refills | Status: DC | PRN
  Filled 2022-10-26: qty 20, 10d supply, fill #0

## 2022-10-26 MED ORDER — AZITHROMYCIN 250 MG PO TABS
ORAL_TABLET | ORAL | 0 refills | Status: DC
  Filled 2022-10-26: qty 6, 5d supply, fill #0

## 2022-10-26 NOTE — Telephone Encounter (Signed)
  Chief Complaint: COVID positive still  Symptoms: cough and congestion in head and chest, still testing positive as of Sunday 10/24/22.  Frequency: 1 week  Pertinent Negatives: Patient denies SOB or fever  Disposition: '[]'$ ED /'[]'$ Urgent Care (no appt availability in office) / '[]'$ Appointment(In office/virtual)/ '[]'$  [facility]/ '[]'$ Home Care/ '[]'$ Refused Recommended Disposition /'[x]'$ [place] [facility]/ '[]'$  Follow-up with PCP Additional Notes: pt asking for VV to get something to help with sx since he is still testing positive for COVID. He states [facility] called and offered VV but he felt better that day and now just feels bad again. No appts until 11/24/22 with practice. Offered [facility] in person and pt states he had to wait on his girlfriend to get back with the car. Asked if [facility] would be able to do a VV again so I reached out to [place], [place] on [facility] and she was going to contact pt.   Summary: positve covid   Pt called saying he has been testing positive for covid for a week and is still feeling bad.  Congestion and cough, no fever, fatigue.  He is wanting to know if something can be sent in .  CB#  [phone]         Reason for Disposition  [1] Continuous (nonstop) coughing interferes with work or school AND [2] no improvement using cough treatment per Care Advice  Answer Assessment - Initial Assessment Questions 1. COVID-19 DIAGNOSIS: "How do you know that you have COVID?" (e.g., positive lab test or self-test, diagnosed by doctor or NP/PA, symptoms after exposure).     Home test 10/19/22 3. ONSET: "When did the COVID-19 symptoms start?"      10/20/22 4. WORST SYMPTOM: "What is your worst symptom?" (e.g., cough, fever, shortness of breath, muscle aches)     Cough and congestion  5. COUGH: "Do you have a cough?" If Yes, ask: "How bad is the cough?"       yes 6. FEVER: "Do you have a fever?" If Yes, ask: "What is your temperature, how was it measured, and when did it start?"     no 7.  RESPIRATORY STATUS: "Describe your breathing?" (e.g., normal; shortness of breath, wheezing, unable to speak)      Normal  9. OTHER SYMPTOMS: "Do you have any other symptoms?"  (e.g., chills, fatigue, headache, loss of smell or taste, muscle pain, sore throat)     Nasal congestion  Protocols used: Coronavirus (COVID-19) Diagnosed or Suspected-A-AH

## 2022-10-26 NOTE — Patient Instructions (Signed)
You are going to take azithromycin as directed, a prednisone taper, and you can use Tessalon Perles twice daily to help you with your cough.  Make sure that you are getting plenty of rest and staying well-hydrated.  I hope that you feel better soon.  Kennieth Rad, PA-C Physician Assistant [place] [email or web address]   Infection Prevention in the Home If you have an infection, may have been exposed to an infection, or are taking care of someone who has an infection, it is important to know how to keep the infection from spreading. Follow your health care provider's instructions and use these guidelines to help stop the spread of infection. How infections are spread In order for an infection to spread, the following must be present: A germ. This may be a virus, bacteria, fungus, or parasite. A place for the germ to live. This may be: On or in a person, animal, plant, or food. In soil or water. On surfaces, such as a door handle. A person or animal who can develop a disease if the germ enters the body (host). The host does not have resistance to the germ. A way for the germ to enter the host. This may occur by: Direct contact with an infected person or animal. This can happen through shaking hands or hugging. Some germs can also travel through the air and spread to others. This can happen when an infected person coughs or sneezes on or near other people. Indirect contact. This occurs when the germ enters the host through contact with an infected object. Examples include: Eating or drinking food or water that is contaminated with the germ. Touching a contaminated surface with your hands, and then touching your face, eyes, nose, or mouth. Supplies needed: Soap. Alcohol-based hand sanitizer. Standard cleaning products. Disinfectants, such as bleach. Reusable cleaning cloths, sponges, or paper towels. Disposable or reusable  utility gloves. How to prevent infection from spreading There are several things that you can do to help prevent infection from spreading. Take these general actions Everyone should take the following actions to prevent the spread of infection: Wash your hands often with soap and water for at least 20 seconds. If soap and water are not available, use alcohol-based hand sanitizer. Avoid touching your face, mouth, nose, or eyes. Cough or sneeze into a tissue, sleeve, or elbow instead of into your hand or into the air. If you cough or sneeze into a tissue, throw it away immediately and wash your hands.  Keep your bathroom clean Provide soap. Change towels and washcloths frequently. Change toothbrushes often and store them separately in a clean, dry place. Clean and disinfect all surfaces, including the toilet, floor, tub, shower, and sink. Do not share personal items, such as razors, toothbrushes, deodorant, combs, brushes, towels, and washcloths. Maintain hygiene in the [facility] your hands before and after preparing food and before you eat. Clean the inside of your refrigerator each week. Keep your refrigerator set at 22F (4C) or less, and set your freezer at 58F (-18C) or less. Keep work surfaces clean. Disinfect them regularly. Wash your dishes in hot, soapy water. Air-dry your dishes or use a dishwasher. Do not share dishes or eating utensils. Handle food safely Store food carefully. Refrigerate leftovers promptly in covered containers. Throw out stale or spoiled food. Thaw foods in the refrigerator or microwave, not at room temperature. Serve foods at the proper temperature. Do not eat raw meat. Make sure it is cooked to the appropriate  temperature. Cook eggs until they are firm. Wash fruits and vegetables under running water. Use separate cutting boards, plates, and utensils for raw foods and cooked foods. Use a clean spoon each time you sample food while cooking. Do  laundry the right way Wear gloves if laundry is visibly soiled. Do not shake soiled laundry. Doing that may send germs into the air. Wash laundry in hot water. If you cannot wash the laundry right away, place it in a plastic bag and wash it as soon as possible. Be careful around animals and pets Wash your hands before and after touching animals. If you have a pet, ensure that your pet stays clean. Do not let people with weak immune systems touch bird droppings, fish tank water, or a litter box. If you have a pet cage or litter box, be sure to clean it every day. If you are sick, stay away from animals and have someone else care for them if possible. How to clean and disinfect objects and surfaces Precautions Some disinfectants work for certain germs and not others. Read the manufacturer's instructions or read online resources to determine if the product you are using will work for the germ you are trying to remove. If you choose to use bleach, use it safely. Never mix it with other cleaning products, especially those that contain ammonia. This mixture can create a dangerous gas that may be deadly. Keep proper movement of fresh air in your home (ventilation). Pour used mop water down the utility sink or toilet. Do not pour this water down the kitchen sink. Objects and surfaces  If surfaces are visibly soiled, clean them first with soap and water before disinfecting. Disinfect surfaces that are frequently touched every day. This may include: Counters. Tables. Doorknobs. Sinks and faucets. Electronics, such as: Architectural technologist. Remote controls. Keyboards. Computers and tablets. Cleaning supplies Some cleaning supplies can breed germs. Take good care of them to prevent germs from spreading. To do this: Soak toilet brushes, mops, and sponges in bleach and water for 5 minutes after each use, or according to manufacturer's instructions. Wash reusable cleaning cloths and sanitize sponges after each  use. Throw away disposable gloves after one use. Replace reusable utility gloves if they are cracked or torn or if they start to peel. Additional actions if you are sick If you live with other people:  Avoid close contact with those around you. Stay at least 3 ft (1 m) away from others, if possible. Use a separate bathroom, if possible. If possible, sleep in a separate bedroom or in a separate bed to prevent infecting other household members. Change bedroom linens each week or whenever they are soiled. Have everyone in the household wash hands often with soap and water for at least 20 seconds. If soap and water are not available, use alcohol-based hand sanitizer. In general: Stay home except to get medical care. Call ahead before visiting your health care provider. Ask others to get groceries and household supplies and to refill prescriptions for you. Avoid public areas. Try not to take public transportation. If you can, wear a mask if you need to go out of the house, or if you are in close contact with someone who is not sick. Avoid visitors until you have completely recovered, or until you have no signs and symptoms of infection. Avoid preparing food or providing care for others. If you must prepare food or provide care for others, wear a mask and wash your hands before and after doing  these things. Where to find more information Centers for Disease Control and Prevention: [employer].cy Summary It is important to know how to keep infection from spreading. Make sure everyone in your household washes their hands often with soap and water. Disinfect surfaces that are frequently touched every day. If you are sick, stay home except to get medical care. This information is not intended to replace advice given to you by your health care provider. Make sure you discuss any questions you have with your health care provider. Document Revised: 11/09/2021 Document Reviewed: 11/09/2021 Elsevier Patient  Education  [place].

## 2022-10-26 NOTE — Progress Notes (Unsigned)
Established Patient Office Visit  Subjective   Patient ID: Kyle Campos, male    DOB: 11/27/1955  Age: 67 y.o. MRN: [id number]  Chief Complaint  Patient presents with   Covid Positive    Tested 1 week ago. Still having Cough and congestion. Tested again on Sunday with still a positive reading     States that he started having upper respiratory symptoms on Monday, January 15.  States that he then tested positive for COVID.  States that he did start feeling better for a couple of days, did try going back to work, but states he was only able to work for couple hours due to the fatigue.  States that he continues to have a productive cough with clear sputum, clear nasal discharge, a small amount of shortness of breath.  Is an everyday cigarette smoker.  States that he has been using Robitussin and ibuprofen with mild relief.  Is eating and drinking okay.    Past Medical History:  Diagnosis Date   Colon polyps 03/14/2015   Tubular adenoma x 6   Diabetes mellitus (HCC)    Diverticulosis    Hepatic steatosis    Hyperlipidemia    Hypertension    Lung nodules    Smoker    Social History   Socioeconomic History   Marital status: Single    Spouse name: Not on file   Number of children: Not on file   Years of education: Not on file   Highest education level: Not on file  Occupational History   Not on file  Tobacco Use   Smoking status: Every Day    Packs/day: 1.00    Years: 40.00    Total pack years: 40.00    Types: Cigarettes   Smokeless tobacco: Never  Vaping Use   Vaping Use: Never used  Substance and Sexual Activity   Alcohol use: Not Currently    Alcohol/week: 0.0 standard drinks of alcohol    Comment: once monthly   Drug use: No   Sexual activity: Not on file  Other Topics Concern   Not on file  Social History Narrative   Not on file   Social Determinants of Health   Financial Resource Strain: Not on file  Food Insecurity: Not on file  Transportation  Needs: Not on file  Physical Activity: Not on file  Stress: Not on file  Social Connections: Not on file  Intimate Partner Violence: Not on file   Family History  Problem Relation Age of Onset   Diabetes Mother    Cancer Father        lung cancer    Stroke Maternal Grandfather    Colon polyps Brother    Colon cancer Neg Hx    Esophageal cancer Neg Hx    Rectal cancer Neg Hx    Stomach cancer Neg Hx    No Known Allergies  Review of Systems  Constitutional:  Positive for malaise/fatigue. Negative for chills and fever.  HENT:  Positive for congestion. Negative for ear pain, sinus pain and sore throat.   Eyes: Negative.   Respiratory:  Positive for cough, sputum production and shortness of breath. Negative for wheezing.   Cardiovascular:  Negative for chest pain.  Gastrointestinal:  Negative for diarrhea, nausea and vomiting.  Genitourinary: Negative.   Musculoskeletal:  Negative for myalgias.  Skin: Negative.   Neurological:  Negative for headaches.  Endo/Heme/Allergies: Negative.   Psychiatric/Behavioral: Negative.        Objective:  BP 131/81   Pulse 77   Ht '6\' 3"'$  (1.905 m)   Wt 215 lb (97.5 kg)   SpO2 96%   BMI 26.87 kg/m    Physical Exam Vitals and nursing note reviewed.  Constitutional:      Appearance: Normal appearance.  HENT:     Head: Normocephalic and atraumatic.     Salivary Glands: Right salivary gland is not diffusely enlarged or tender. Left salivary gland is not diffusely enlarged or tender.     Right Ear: Tympanic membrane, ear canal and external ear normal.     Left Ear: Tympanic membrane, ear canal and external ear normal.     Nose:     Right Turbinates: Enlarged and swollen.     Left Turbinates: Enlarged and swollen.     Right Sinus: No maxillary sinus tenderness or frontal sinus tenderness.     Left Sinus: No frontal sinus tenderness.     Mouth/Throat:     Pharynx: Oropharynx is clear.     Tonsils: No tonsillar exudate. 0 on the  right. 0 on the left.  Cardiovascular:     Rate and Rhythm: Normal rate and regular rhythm.     Pulses: Normal pulses.     Heart sounds: Normal heart sounds.  Pulmonary:     Effort: Pulmonary effort is normal.     Breath sounds: Normal breath sounds. No wheezing.  Musculoskeletal:        General: Normal range of motion.     Cervical back: Normal range of motion and neck supple.  Skin:    General: Skin is warm and dry.  Neurological:     General: No focal deficit present.     Mental Status: He is alert and oriented to person, place, and time.  Psychiatric:        Mood and Affect: Mood normal.        Behavior: Behavior normal.        Thought Content: Thought content normal.        Judgment: Judgment normal.        Assessment & Plan:   Problem List Items Addressed This Visit   None Visit Diagnoses     COVID    -  Primary   Relevant Medications   predniSONE (DELTASONE) 20 MG tablet   azithromycin (ZITHROMAX) 250 MG tablet   benzonatate (TESSALON) 200 MG capsule     1. COVID Trial azithromycin, prednisone taper, Tessalon Perles.  Patient education given on supportive care, red flags given for prompt reevaluation. - predniSONE (DELTASONE) 20 MG tablet; Take 3 tablets (60 mg total) by mouth daily with breakfast for 2 days, THEN 2 tablets (40 mg total) daily with breakfast for 2 days, THEN 1 tablet (20 mg total) daily with breakfast for 2 days, THEN 0.5 tablets (10 mg total) daily with breakfast for 2 days.  Dispense: 13 tablet; Refill: 0 - azithromycin (ZITHROMAX) 250 MG tablet; TAKE 2 TABLETS BY MOUTH on day 1, THEN 1 TABLET DAILY ON DAYS 2-5  Dispense: 6 tablet; Refill: 0 - benzonatate (TESSALON) 200 MG capsule; Take 1 capsule (200 mg total) by mouth 2 (two) times daily as needed for cough.  Dispense: 20 capsule; Refill: 0   I have reviewed the patient's medical history (PMH, PSH, Social History, Family History, Medications, and allergies) , and have been updated if relevant.  I spent 30 minutes reviewing chart and  face to face time with patient.   Return if symptoms worsen or fail  to improve.    Loraine Grip Mayers, PA-C

## 2022-10-26 NOTE — Telephone Encounter (Signed)
Per epic patient has virtual visit with mobile unit today.

## 2022-10-27 ENCOUNTER — Encounter: Payer: Self-pay | Admitting: Physician Assistant

## 2022-11-15 ENCOUNTER — Other Ambulatory Visit: Payer: Self-pay

## 2022-11-17 ENCOUNTER — Other Ambulatory Visit: Payer: Self-pay

## 2022-12-10 IMAGING — CT CT CHEST LUNG CANCER SCREENING LOW DOSE W/O CM
1 of 2 series · 14 of 33 positions shown, 18 images · non-contrast
Comparison: No priors.

CLINICAL DATA: 64-year-old male current smoker with 30 pack-year
history of smoking. Lung cancer screening examination.

EXAM:
CT CHEST WITHOUT CONTRAST LOW-DOSE FOR LUNG CANCER SCREENING
TECHNIQUE: Multidetector CT imaging of the chest was performed following the
standard protocol without IV contrast.

[Series 2: ldct soft tissue · axial · 0.81mm/px · z∈[+1160,+1465]mm · 14 of 67 slices shown, 18 images]
[im 3/67  mediastinal]
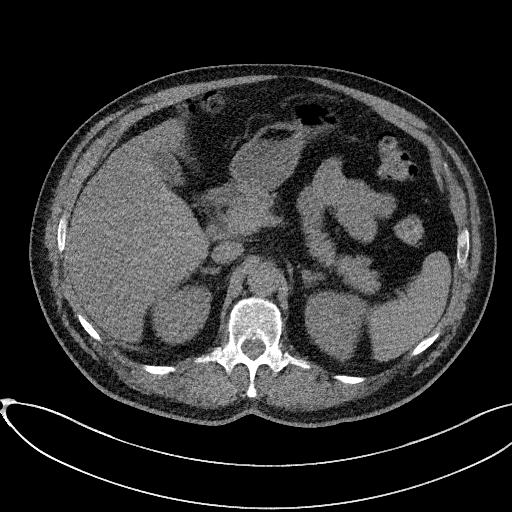
[im 3/67  lung]
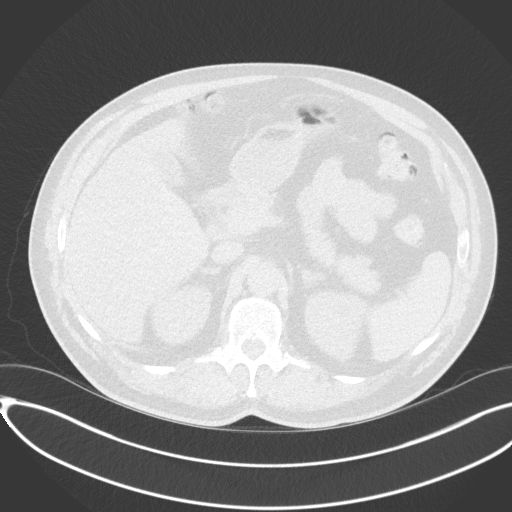
[im 8/67  lung]
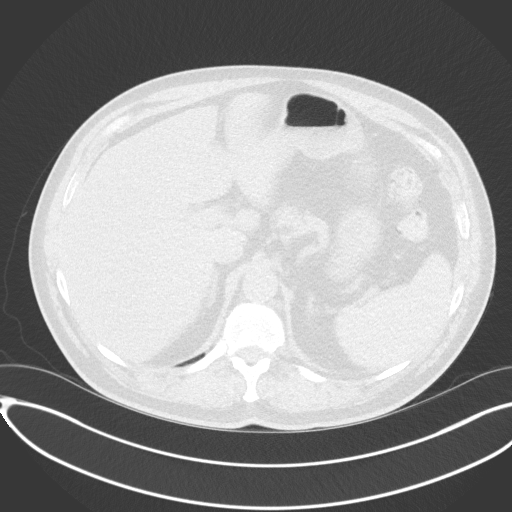
[im 13/67  lung]
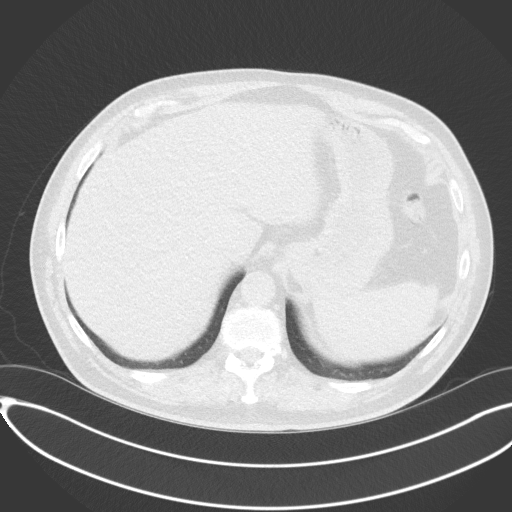
[im 18/67  lung]
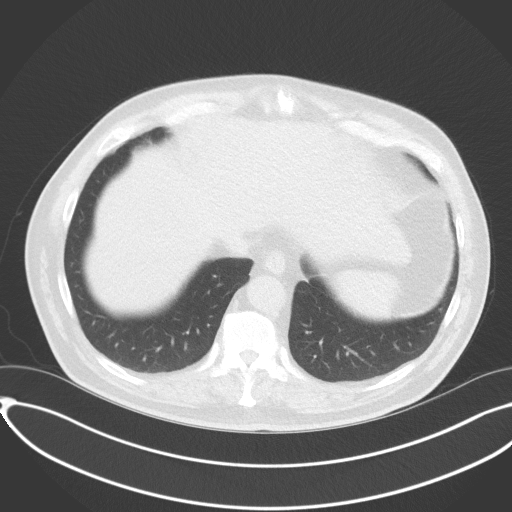
[im 23/67  mediastinal]
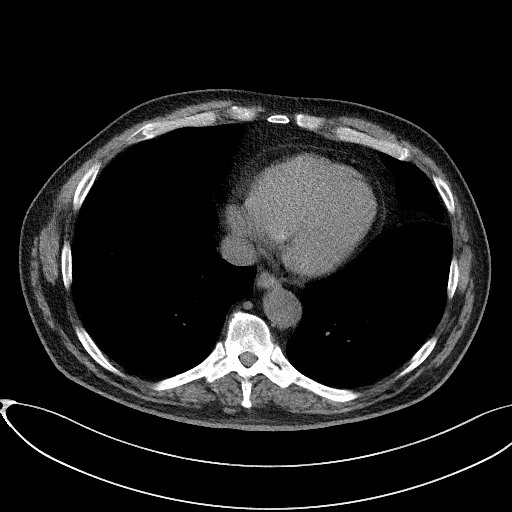
[im 23/67  lung]
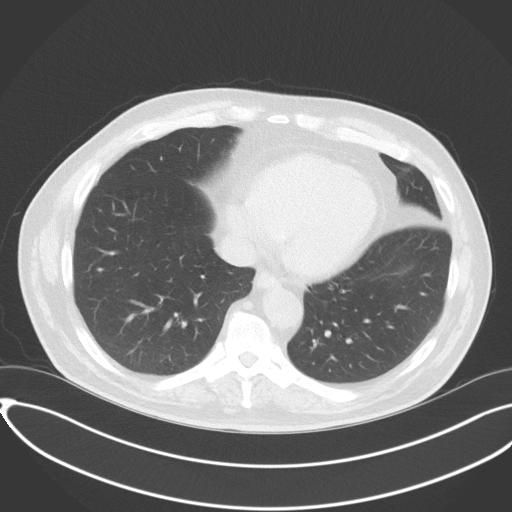
[im 27/67  lung]
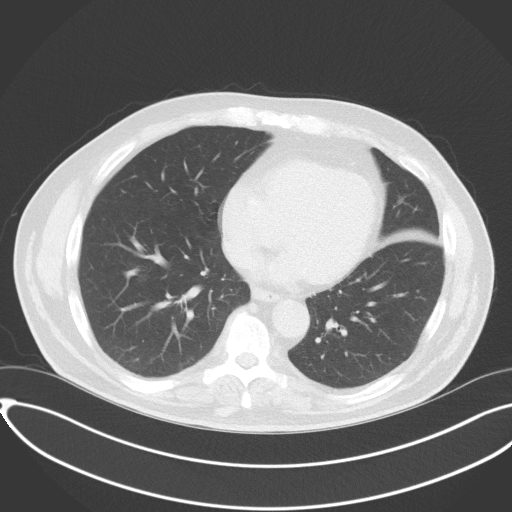
[im 32/67  lung]
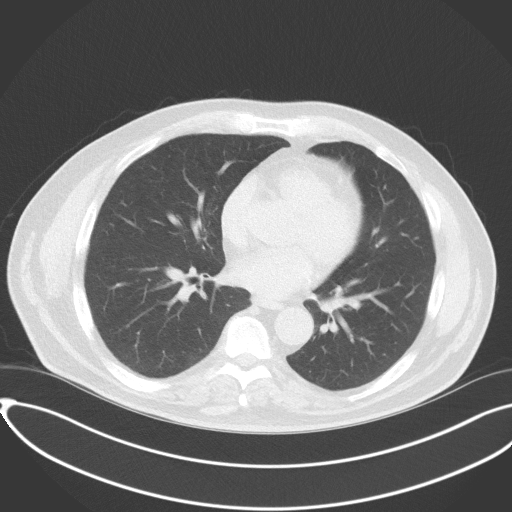
[im 35/67  lung]
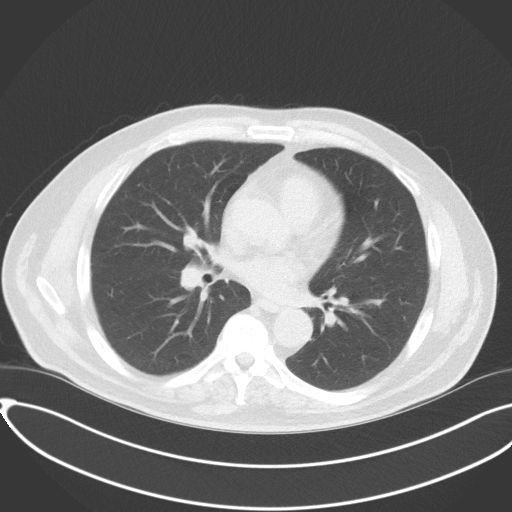
[im 40/67  mediastinal]
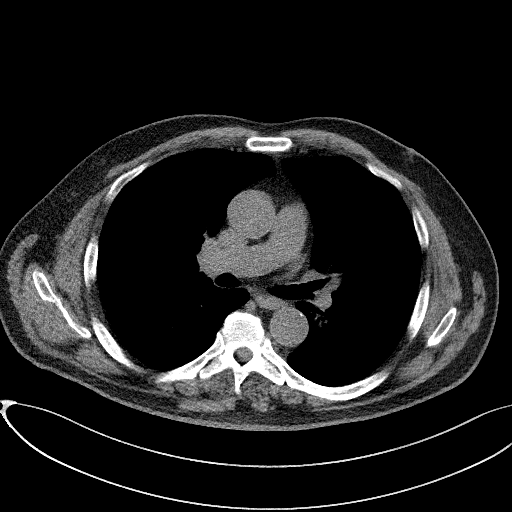
[im 40/67  lung]
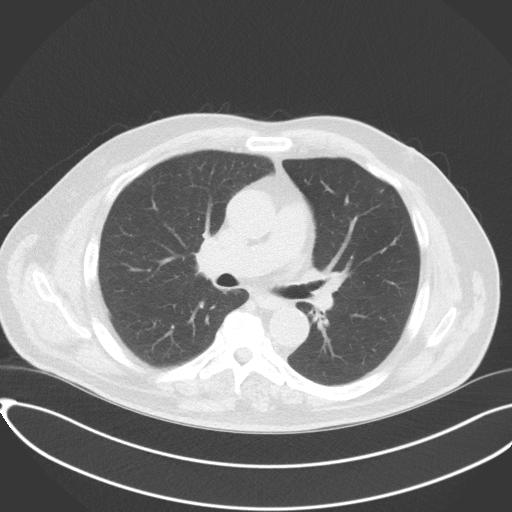
[im 45/67  lung]
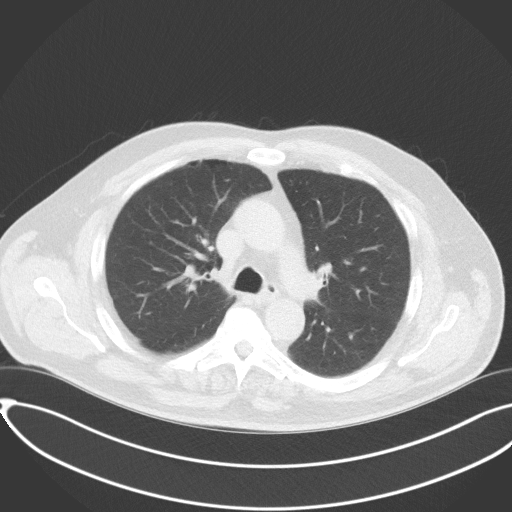
[im 49/67  lung]
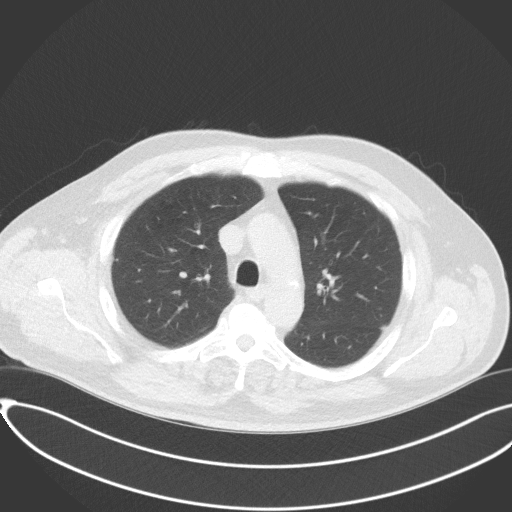
[im 54/67  lung]
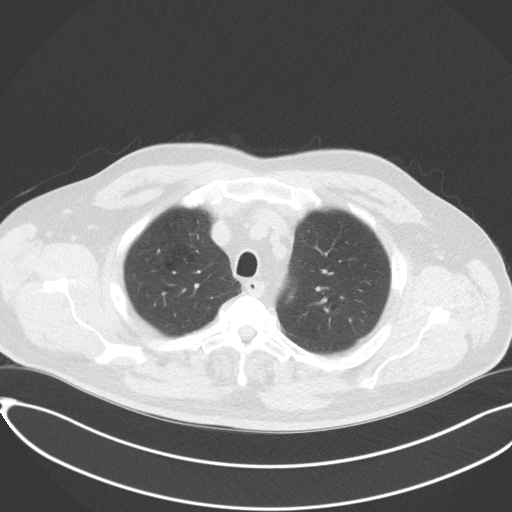
[im 59/67  mediastinal]
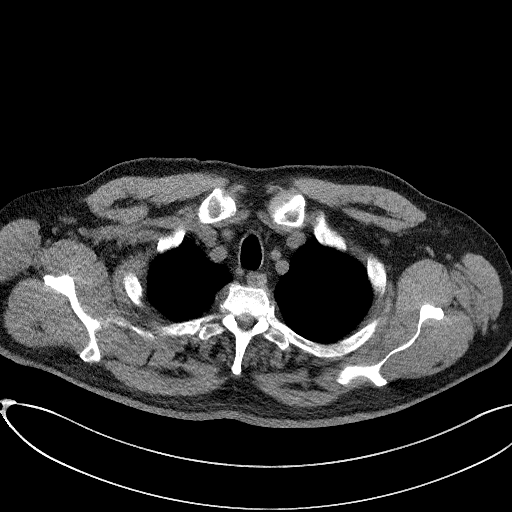
[im 59/67  lung]
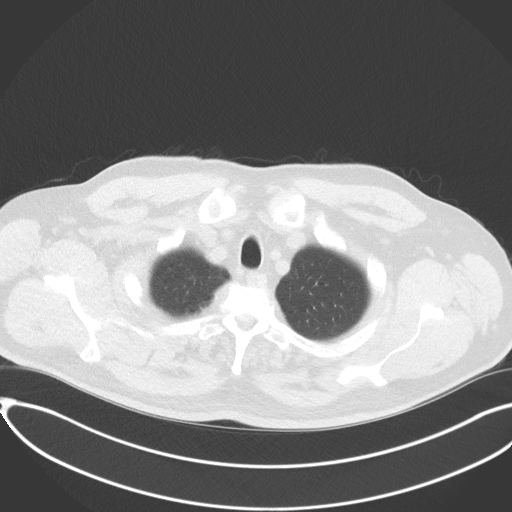
[im 64/67  lung]
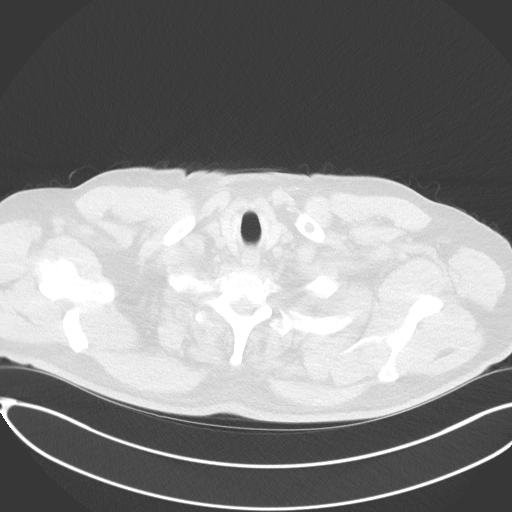

[14 of 33 positions shown; findings below may reference images not displayed]

FINDINGS: Cardiovascular: Heart size is normal. There is no significant
pericardial fluid, thickening or pericardial calcification. There is
aortic atherosclerosis, as well as atherosclerosis of the great
vessels of the mediastinum and the coronary arteries, including
calcified atherosclerotic plaque in the left main, left anterior
descending and left circumflex coronary arteries.

Mediastinum/Nodes: No pathologically enlarged mediastinal or hilar
lymph nodes. Please note that accurate exclusion of hilar adenopathy
is limited on noncontrast CT scans. Esophagus is unremarkable in
appearance. No axillary lymphadenopathy.

Lungs/Pleura: Multiple small pulmonary nodules are noted throughout
the lungs bilaterally, largest of which is in the posterior aspect
of the left lower lobe (axial image 214 of series 3), with a volume
derived mean diameter of 5.1 mm. No other larger more suspicious
appearing pulmonary nodules or masses are noted. No acute
consolidative airspace disease. No pleural effusions. Mild diffuse
bronchial wall thickening with very mild centrilobular and
paraseptal emphysema.

Upper Abdomen: Diffuse low attenuation throughout the visualized
hepatic parenchyma, indicative of hepatic steatosis.

Musculoskeletal: There are no aggressive appearing lytic or blastic
lesions noted in the visualized portions of the skeleton.
IMPRESSION: 1. Lung-RADS 2S, benign appearance or behavior. Continue annual
screening with low-dose chest CT without contrast in 12 months.
2. The "S" modifier above refers to potentially clinically
significant non lung cancer related findings. Specifically, there is
aortic atherosclerosis, in addition to left main and 2 vessel
coronary artery disease. Please note that although the presence of
coronary artery calcium documents the presence of coronary artery
disease, the severity of this disease and any potential stenosis
cannot be assessed on this non-gated CT examination. Assessment for
potential risk factor modification, dietary therapy or pharmacologic
therapy may be warranted, if clinically indicated.
3. Mild diffuse bronchial wall thickening with very mild
centrilobular and paraseptal emphysema; imaging findings suggestive
of underlying COPD.
4. Hepatic steatosis.

Aortic Atherosclerosis (ED8Q5-P8G.G) and Emphysema (ED8Q5-1MT.F).

## 2022-12-18 IMAGING — MR MR NECK WO/W CM
5 of 12 series · 19 of 48 positions shown · IV contrast (gadavist)
Comparison: CT neck 4188

CLINICAL DATA: Neck mass

EXAM:
MRI OF THE NECK WITH CONTRAST
TECHNIQUE: Multiplanar, multisequence MR imaging was performed following the
administration of intravenous contrast.
CONTRAST:  7.5mL GADAVIST GADOBUTROL 1 MMOL/ML IV SOLN

[Series 3: T1 · sagittal · 3.0mm · 0.47mm/px · 3 of 37 slices shown]
[im 1/37]
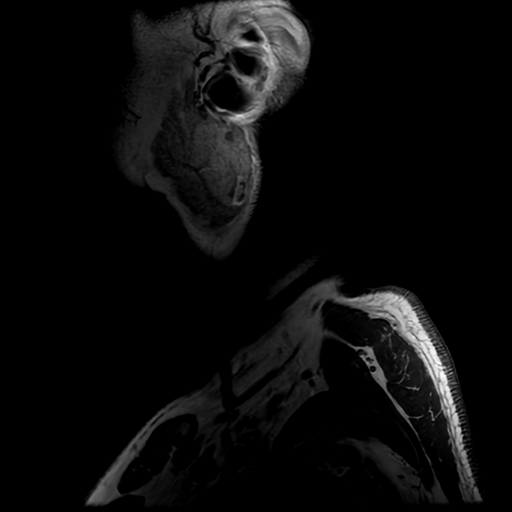
[im 19/37]
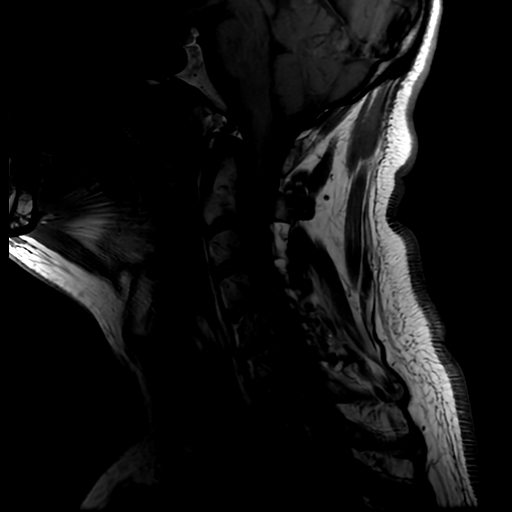
[im 37/37]
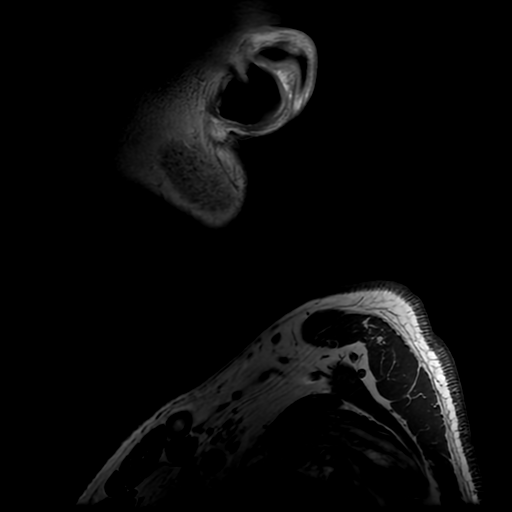

[Series 6: T2 · sagittal · 3.5mm · 0.47mm/px · 4 of 39 slices shown (1 of 3)]
[im 1/39]
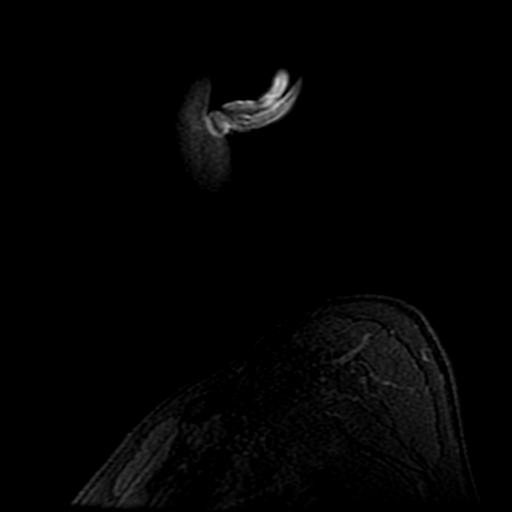
[im 13/39]
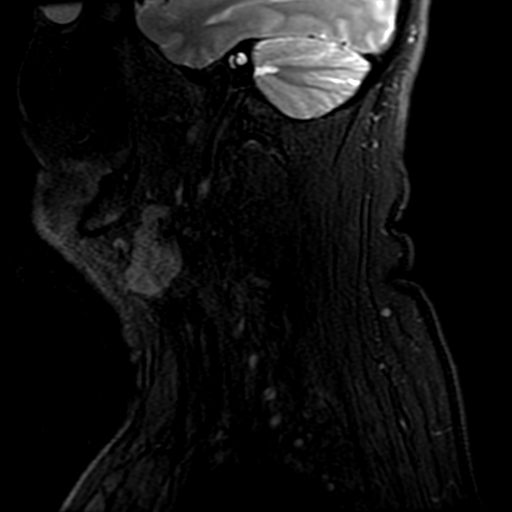
[im 26/39]
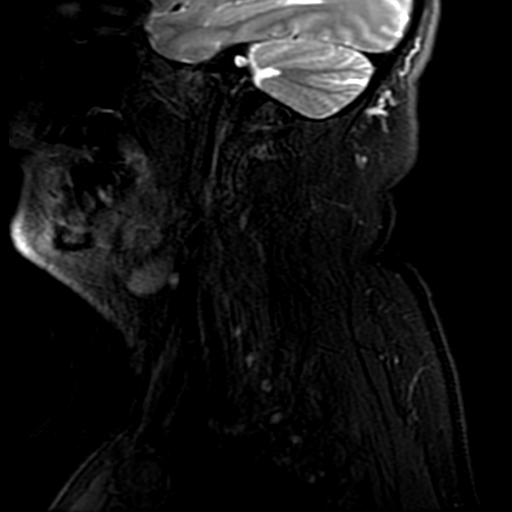
[im 39/39]
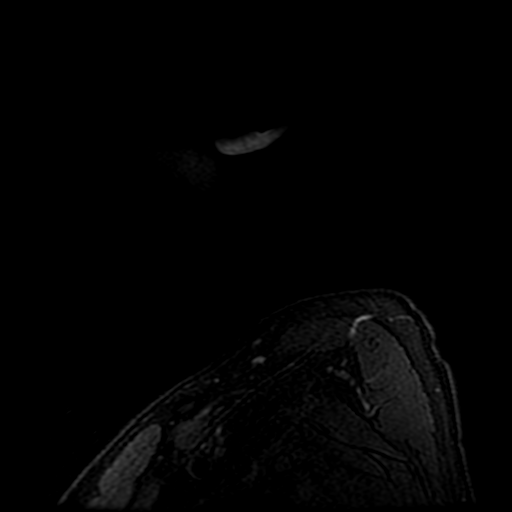

[Series 7: T2 · coronal · 4.0mm · 0.47mm/px · 4 of 37 slices shown (2 of 3)]
[im 1/37]
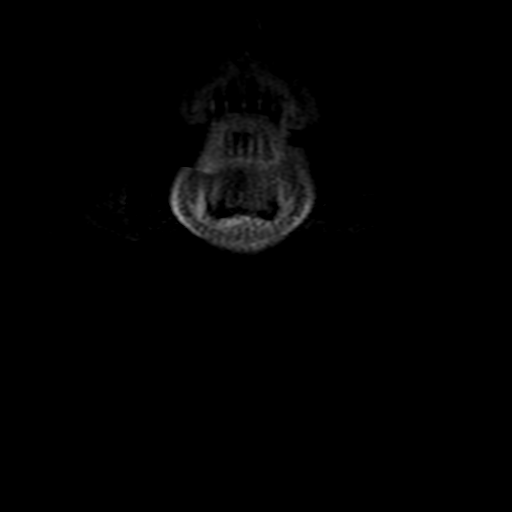
[im 13/37]
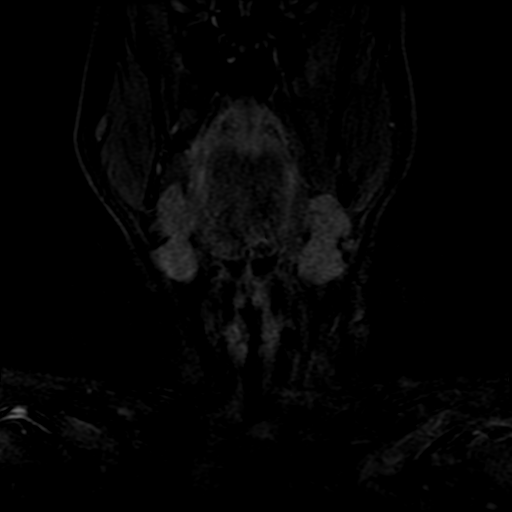
[im 25/37]
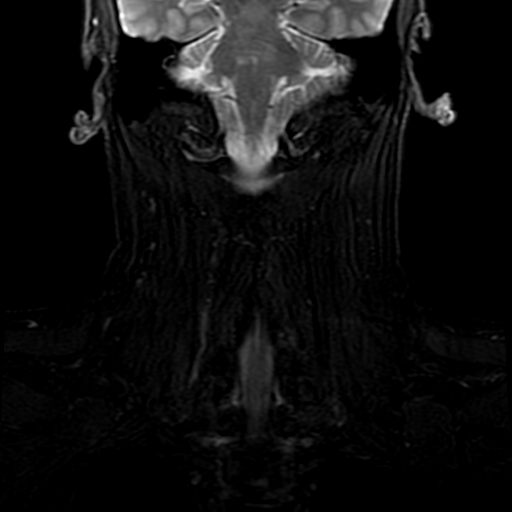
[im 37/37]
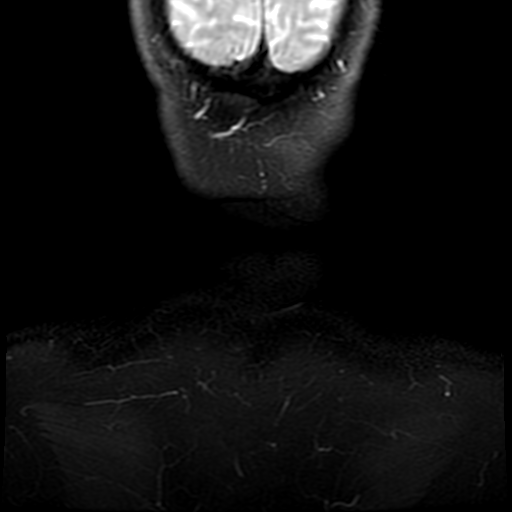

[Series 8: T2 · axial · 3.0mm · 0.66mm/px · z∈[-142,+25]mm · 4 of 46 slices shown (3 of 3)]
[im 1/46]
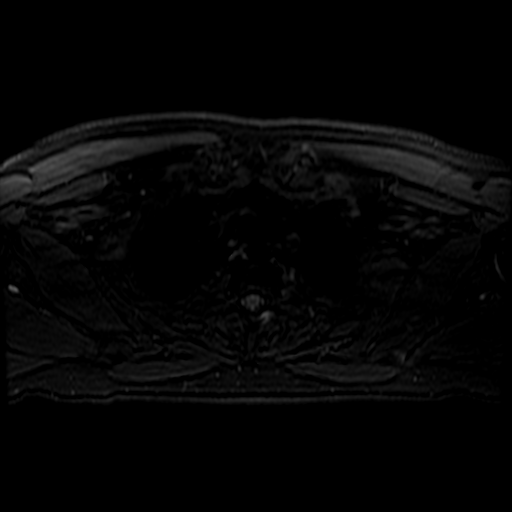
[im 16/46]
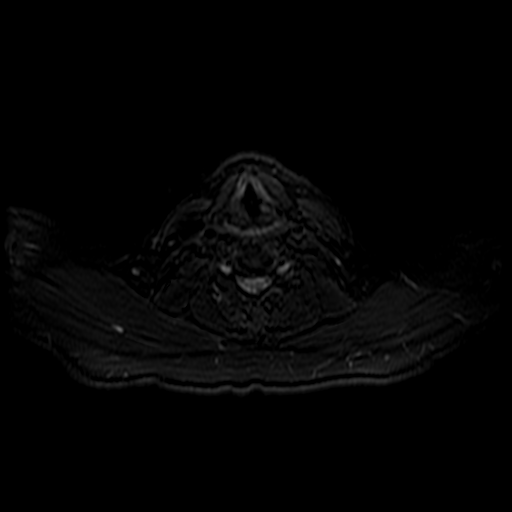
[im 31/46]
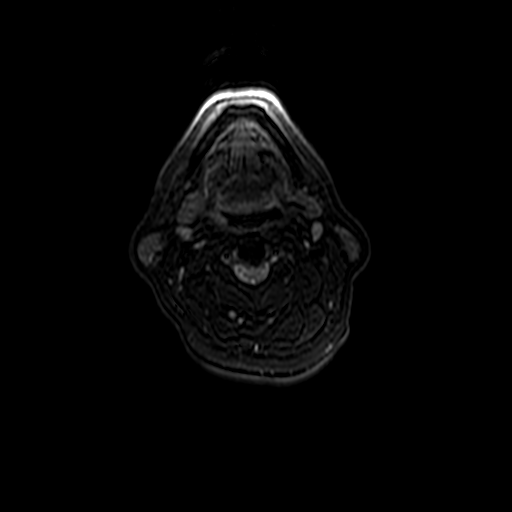
[im 46/46]
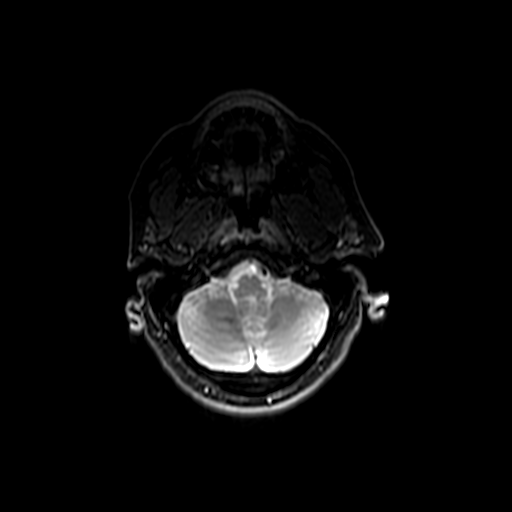

[Series 10: T1 post-contrast · sagittal · 3.5mm · 0.47mm/px · 4 of 39 slices shown]
[im 1/39]
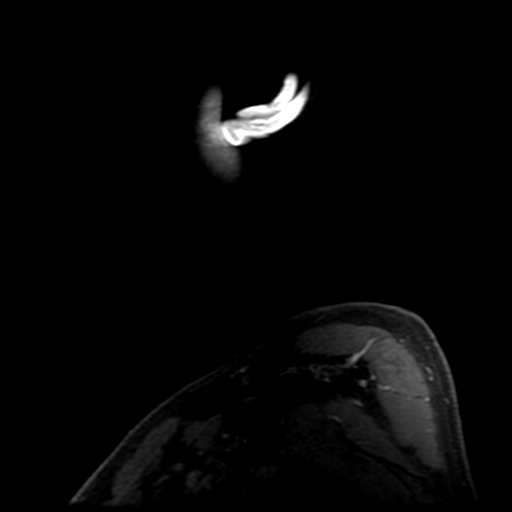
[im 13/39]
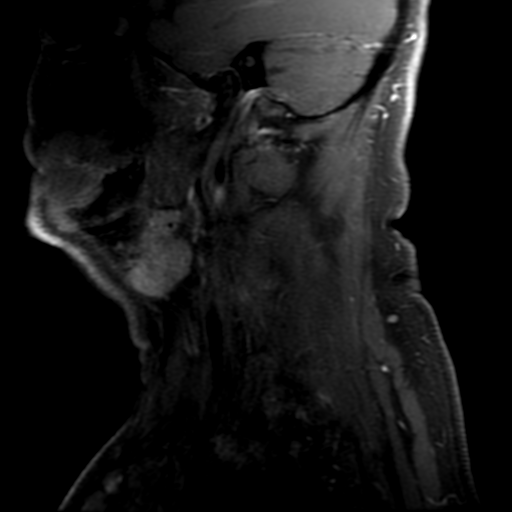
[im 26/39]
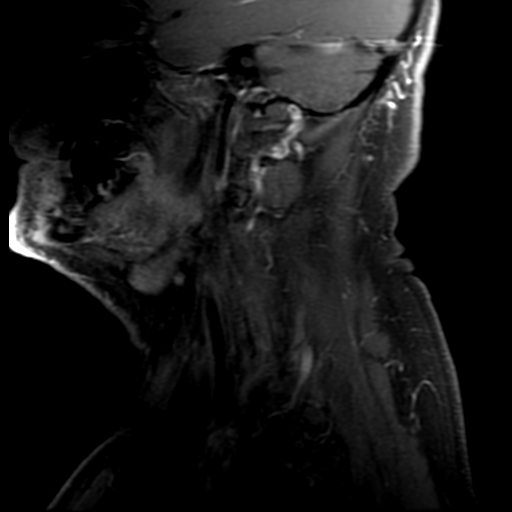
[im 39/39]
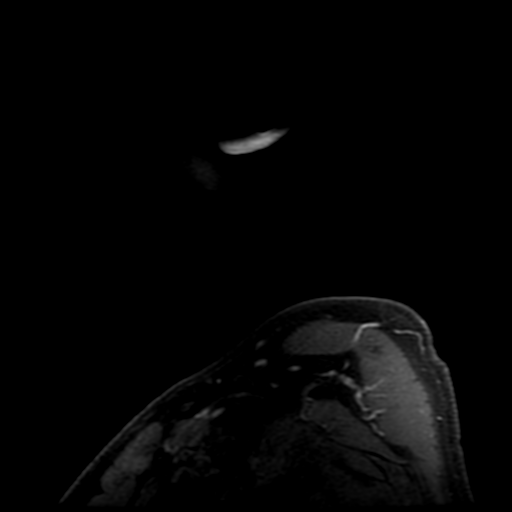

[19 of 48 positions shown; findings below may reference images not displayed]

FINDINGS: Motion artifact is present.

Pharynx and larynx: Unremarkable.  No mass or swelling.

Salivary glands: Parotid and submandibular glands are unremarkable.

Thyroid: Unremarkable.

Lymph nodes: No enlarged or abnormal density nodes identified.

Vascular: Major neck vessel flow voids are preserved.

Limited intracranial: Unremarkable.

Visualized orbits: Minimally included.

Mastoids and visualized paranasal sinuses: No significant
opacification.

Skeleton: Mild degenerative changes of the included spine.

Upper chest: Minimally included.

Other: None.
IMPRESSION: No neck mass or adenopathy.

## 2022-12-27 ENCOUNTER — Other Ambulatory Visit: Payer: Self-pay

## 2023-01-03 ENCOUNTER — Ambulatory Visit: Payer: Self-pay | Admitting: Nurse Practitioner

## 2023-01-21 ENCOUNTER — Other Ambulatory Visit: Payer: Self-pay

## 2023-01-26 ENCOUNTER — Other Ambulatory Visit: Payer: Self-pay

## 2023-02-15 ENCOUNTER — Ambulatory Visit: Payer: Medicaid Other | Attending: Nurse Practitioner | Admitting: Nurse Practitioner

## 2023-02-15 ENCOUNTER — Encounter: Payer: Self-pay | Admitting: Nurse Practitioner

## 2023-02-15 VITALS — BP 117/72 | HR 79 | Ht 75.0 in | Wt 208.6 lb

## 2023-02-15 DIAGNOSIS — I10 Essential (primary) hypertension: Secondary | ICD-10-CM | POA: Diagnosis not present

## 2023-02-15 DIAGNOSIS — Z7985 Long-term (current) use of injectable non-insulin antidiabetic drugs: Secondary | ICD-10-CM

## 2023-02-15 DIAGNOSIS — Z7984 Long term (current) use of oral hypoglycemic drugs: Secondary | ICD-10-CM | POA: Diagnosis not present

## 2023-02-15 DIAGNOSIS — E1165 Type 2 diabetes mellitus with hyperglycemia: Secondary | ICD-10-CM | POA: Insufficient documentation

## 2023-02-15 LAB — POCT GLYCOSYLATED HEMOGLOBIN (HGB A1C): Hemoglobin A1C: 7 % — AB (ref 4.0–5.6)

## 2023-02-15 NOTE — Progress Notes (Signed)
No concerns. 

## 2023-02-15 NOTE — Progress Notes (Signed)
Assessment & Plan:  Kyle Campos was seen today for diabetes and hypertension.  Diagnoses and all orders for this visit:  Type 2 diabetes mellitus with hyperglycemia, without long-term current use of insulin (HCC) -     POCT glycosylated hemoglobin (Hb A1C) -     CMP14+EGFR  Primary hypertension Continue all antihypertensives as prescribed.  Reminded to bring in blood pressure log for follow  up appointment.  RECOMMENDATIONS: DASH/Mediterranean Diets are healthier choices for HTN.      Patient has been counseled on age-appropriate routine health concerns for screening and prevention. These are reviewed and up-to-date. Referrals have been placed accordingly. Immunizations are up-to-date or declined.    Subjective:   Chief Complaint  Patient presents with   Diabetes   Hypertension   HPI BAO Campos 67 y.o. male presents to office today for follow up to HTN and DM  He is doing well today. Blood pressure is well controlled. He has no concerns or questions today.    He has a past medical history of Colon polyps (03/14/2015), Diabetes mellitus (HCC), Diverticulosis, Hepatic steatosis, Hyperlipidemia, Hypertension, Lung nodules, and Smoker.    Patient has been counseled on age-appropriate routine health concerns for screening and prevention. These are reviewed and up-to-date. Referrals have been placed accordingly. Immunizations are up-to-date or declined.     Overdue for lung CT screening. Patient has been advised to apply for financial assistance and schedule to see our financial counselor.   COLONOSCOPY: UTD PSA: UTD CT lung cancer screening: Overdue. Scheduled  Hypertension Blood pressure is well controlled and he endorses adherence taking lisinopril 10 mg daily. He is staying active.  All and is adherent to low salt diet.  He does not have a blood pressure log  today.  Blood pressure is well controlled at home.  Cardiac symptoms none.  Cardiovascular risk factors: advanced  age (older than 34 for men, 4 for women), diabetes mellitus, dyslipidemia, hypertension, male gender, and smoking/ tobacco exposure. Use of agents associated with hypertension: none.  History of target organ damage: none. BP Readings from Last 3 Encounters:  02/15/23 117/72  10/26/22 131/81  10/01/22 118/76     Diabetes Mellitus Type II Diabetes is improving almost at goal of less than 7.  Current symptoms/problems include hyperglycemia and have been improving.  Known diabetic complications: none Current diabetic medications include: Trulicity 0.75 mg weekly and metformin 500 mg twice daily Eye exam current (within one year): no uninsured Weight trend: decreasing steadily Prior visit with dietician: no/uninsured Current monitoring regimen: office lab tests - quarterly Home blood sugar records:  Infrequent monitoring Any episodes of hypoglycemia? no Is He on ACE inhibitor or angiotensin II receptor blocker?  Yes  Lab Results  Component Value Date   HGBA1C 7.0 (A) 02/15/2023    Lab Results  Component Value Date   HGBA1C 7.2 (H) 10/01/2022    Review of Systems  Constitutional:  Negative for fever, malaise/fatigue and weight loss.  HENT: Negative.  Negative for nosebleeds.   Eyes: Negative.  Negative for blurred vision, double vision and photophobia.  Respiratory: Negative.  Negative for cough and shortness of breath.   Cardiovascular: Negative.  Negative for chest pain, palpitations and leg swelling.  Gastrointestinal: Negative.  Negative for heartburn, nausea and vomiting.  Musculoskeletal: Negative.  Negative for myalgias.  Neurological: Negative.  Negative for dizziness, focal weakness, seizures and headaches.  Psychiatric/Behavioral: Negative.  Negative for suicidal ideas.     Past Medical History:  Diagnosis Date  Colon polyps 03/14/2015   Tubular adenoma x 6   Diabetes mellitus (HCC)    Diverticulosis    Hepatic steatosis    Hyperlipidemia    Hypertension    Lung  nodules    Smoker     Past Surgical History:  Procedure Laterality Date   APPENDECTOMY  at age 43    colonoscopy  03/14/2015    Family History  Problem Relation Age of Onset   Diabetes Mother    Cancer Father        lung cancer    Stroke Maternal Grandfather    Colon polyps Brother    Colon cancer Neg Hx    Esophageal cancer Neg Hx    Rectal cancer Neg Hx    Stomach cancer Neg Hx     Social History Reviewed with no changes to be made today.   Outpatient Medications Prior to Visit  Medication Sig Dispense Refill   albuterol (VENTOLIN HFA) 108 (90 Base) MCG/ACT inhaler Inhale 2 puffs into the lungs every 6 (six) hours as needed for wheezing or shortness of breath. 6.7 g 2   atorvastatin (LIPITOR) 40 MG tablet Take 1 tablet (40 mg total) by mouth daily. 90 tablet 1   Blood Glucose Monitoring Suppl (TRUE METRIX METER) w/Device KIT Monitor blood glucose levels twice per day 1 kit 0   cyclobenzaprine (FLEXERIL) 10 MG tablet Take 1 tablet (10 mg total) by mouth 3 (three) times daily as needed for muscle spasms. 60 tablet 0   Dulaglutide (TRULICITY) 0.75 MG/0.5ML SOPN Inject 0.75 mg into the skin once a week. 6 mL 1   glucose blood (TRUE METRIX BLOOD GLUCOSE TEST) test strip Monitor blood glucose levels twice per day 100 each 2   lisinopril (ZESTRIL) 10 MG tablet Take 1 tablet (10 mg total) by mouth daily. 90 tablet 1   metFORMIN (GLUCOPHAGE) 1000 MG tablet Take 1 tablet (1,000 mg total) by mouth 2 (two) times daily with a meal. 180 tablet 1   naproxen (NAPROSYN) 500 MG tablet Take 1 tablet (500 mg total) by mouth 2 (two) times daily with a meal. 180 tablet 0   TRUEplus Lancets 28G MISC Monitor blood glucose levels twice per day 100 each 2   azithromycin (ZITHROMAX) 250 MG tablet TAKE 2 TABLETS BY MOUTH on day 1, THEN 1 TABLET DAILY ON DAYS 2-5 6 tablet 0   benzonatate (TESSALON) 200 MG capsule Take 1 capsule (200 mg total) by mouth 2 (two) times daily as needed for cough. 20 capsule 0    betamethasone dipropionate 0.05 % cream Apply topically 2 (two) times daily. (Patient not taking: Reported on 10/01/2022) 30 g 0   No facility-administered medications prior to visit.    No Known Allergies     Objective:    BP 117/72 (BP Location: Left Arm, Patient Position: Sitting, Cuff Size: Normal)   Pulse 79   Ht 6\' 3"  (1.905 m)   Wt 208 lb 9.6 oz (94.6 kg)   SpO2 97%   BMI 26.07 kg/m  Wt Readings from Last 3 Encounters:  02/15/23 208 lb 9.6 oz (94.6 kg)  10/26/22 215 lb (97.5 kg)  10/01/22 215 lb (97.5 kg)    Physical Exam Vitals and nursing note reviewed.  Constitutional:      Appearance: He is well-developed.  HENT:     Head: Normocephalic and atraumatic.  Cardiovascular:     Rate and Rhythm: Normal rate and regular rhythm.     Heart sounds: Normal heart  sounds. No murmur heard.    No friction rub. No gallop.  Pulmonary:     Effort: Pulmonary effort is normal. No tachypnea or respiratory distress.     Breath sounds: Normal breath sounds. No decreased breath sounds, wheezing, rhonchi or rales.  Chest:     Chest wall: No tenderness.  Abdominal:     General: Bowel sounds are normal.     Palpations: Abdomen is soft.  Musculoskeletal:        General: Normal range of motion.     Cervical back: Normal range of motion.  Skin:    General: Skin is warm and dry.  Neurological:     Mental Status: He is alert and oriented to person, place, and time.     Coordination: Coordination normal.  Psychiatric:        Behavior: Behavior normal. Behavior is cooperative.        Thought Content: Thought content normal.        Judgment: Judgment normal.          Patient has been counseled extensively about nutrition and exercise as well as the importance of adherence with medications and regular follow-up. The patient was given clear instructions to go to ER or return to medical center if symptoms don't improve, worsen or new problems develop. The patient verbalized  understanding.   Follow-up: Return in about 3 months (around 05/18/2023).   Claiborne Rigg, FNP-BC [facility] [place], [place] [phone]   02/15/2023, 10:21 AM

## 2023-02-16 LAB — CMP14+EGFR
ALT: 23 IU/L (ref 0–44)
AST: 14 IU/L (ref 0–40)
Albumin/Globulin Ratio: 2.1 (ref 1.2–2.2)
Albumin: 4.8 g/dL (ref 3.9–4.9)
Alkaline Phosphatase: 56 IU/L (ref 44–121)
BUN/Creatinine Ratio: 27 — ABNORMAL HIGH (ref 10–24)
BUN: 23 mg/dL (ref 8–27)
Bilirubin Total: 0.3 mg/dL (ref 0.0–1.2)
CO2: 24 mmol/L (ref 20–29)
Calcium: 10 mg/dL (ref 8.6–10.2)
Chloride: 101 mmol/L (ref 96–106)
Creatinine, Ser: 0.86 mg/dL (ref 0.76–1.27)
Globulin, Total: 2.3 g/dL (ref 1.5–4.5)
Glucose: 145 mg/dL — ABNORMAL HIGH (ref 70–99)
Potassium: 4.7 mmol/L (ref 3.5–5.2)
Sodium: 139 mmol/L (ref 134–144)
Total Protein: 7.1 g/dL (ref 6.0–8.5)
eGFR: 97 mL/min/{1.73_m2} (ref >59)

## 2023-02-21 ENCOUNTER — Ambulatory Visit (INDEPENDENT_AMBULATORY_CARE_PROVIDER_SITE_OTHER): Payer: Medicaid Other

## 2023-02-21 DIAGNOSIS — F17200 Nicotine dependence, unspecified, uncomplicated: Secondary | ICD-10-CM

## 2023-02-21 DIAGNOSIS — F17210 Nicotine dependence, cigarettes, uncomplicated: Secondary | ICD-10-CM

## 2023-02-21 DIAGNOSIS — Z122 Encounter for screening for malignant neoplasm of respiratory organs: Secondary | ICD-10-CM

## 2023-02-25 ENCOUNTER — Telehealth: Payer: Self-pay | Admitting: Nurse Practitioner

## 2023-02-25 ENCOUNTER — Other Ambulatory Visit: Payer: Self-pay

## 2023-02-25 NOTE — Telephone Encounter (Signed)
Pt is calling in because he went to the pharmacy to pick up his Dulaglutide (TRULICITY) 0.75 MG/0.5ML SOPN [[id number]] prescription and the pharmacy told him that he needs a Pre-Authorization in order to get this medication. Please advise.

## 2023-03-09 ENCOUNTER — Other Ambulatory Visit (HOSPITAL_COMMUNITY): Payer: Self-pay

## 2023-03-09 ENCOUNTER — Other Ambulatory Visit: Payer: Self-pay

## 2023-04-12 ENCOUNTER — Other Ambulatory Visit: Payer: Self-pay

## 2023-04-12 ENCOUNTER — Other Ambulatory Visit: Payer: Self-pay | Admitting: Nurse Practitioner

## 2023-04-12 DIAGNOSIS — M542 Cervicalgia: Secondary | ICD-10-CM

## 2023-04-12 MED ORDER — NAPROXEN 500 MG PO TABS
500.0000 mg | ORAL_TABLET | Freq: Two times a day (BID) | ORAL | 0 refills | Status: DC
Start: 2023-04-12 — End: 2023-05-24
  Filled 2023-04-12: qty 180, 90d supply, fill #0

## 2023-04-12 NOTE — Telephone Encounter (Signed)
Requested Prescriptions  Pending Prescriptions Disp Refills   naproxen (NAPROSYN) 500 MG tablet 180 tablet 0    Sig: Take 1 tablet (500 mg total) by mouth 2 (two) times daily with a meal.     Analgesics:  NSAIDS Failed - 04/12/2023  6:24 AM      Failed - Manual Review: Labs are only required if the patient has taken medication for more than 8 weeks.      Passed - Cr in normal range and within 360 days    Creat  Date Value Ref Range Status  01/02/2015 0.80 0.50 - 1.35 mg/dL Final   Creatinine, Ser  Date Value Ref Range Status  02/15/2023 0.86 0.76 - 1.27 mg/dL Final   Creatinine, Urine  Date Value Ref Range Status  09/14/2016 145 20 - 370 mg/dL Final         Passed - HGB in normal range and within 360 days    Hemoglobin  Date Value Ref Range Status  10/01/2022 15.9 13.0 - 17.7 g/dL Final         Passed - PLT in normal range and within 360 days    Platelets  Date Value Ref Range Status  10/01/2022 276 150 - 450 x10E3/uL Final         Passed - HCT in normal range and within 360 days    Hematocrit  Date Value Ref Range Status  10/01/2022 47.4 37.5 - 51.0 % Final         Passed - eGFR is 30 or above and within 360 days    GFR, Est African American  Date Value Ref Range Status  01/02/2015 >89 mL/min Final   GFR calc Af Amer  Date Value Ref Range Status  06/20/2020 107 >59 mL/min/1.73 Final    Comment:    **[facility] currently reports eGFR in compliance with the current**   recommendations of the [employer]. [facility] will   update reporting as new guidelines are published from the NKF-ASN   Task force.    GFR, Est Non African American  Date Value Ref Range Status  01/02/2015 >89 mL/min Final    Comment:      The estimated GFR is a calculation valid for adults (>=34 years old) that uses the CKD-EPI algorithm to adjust for age and sex. It is   not to be used for children, pregnant women, hospitalized patients,    patients on dialysis, or with rapidly  changing kidney function. According to the NKDEP, eGFR >89 is normal, 60-89 shows mild impairment, 30-59 shows moderate impairment, 15-29 shows severe impairment and <15 is ESRD.      GFR calc non Af Amer  Date Value Ref Range Status  06/20/2020 93 >59 mL/min/1.73 Final   GFR  Date Value Ref Range Status  09/23/2015 83.73 >60.00 mL/min Final   eGFR  Date Value Ref Range Status  02/15/2023 97 >59 mL/min/1.73 Final         Passed - Patient is not pregnant      Passed - Valid encounter within last 12 months    Recent Outpatient Visits           1 month ago Type 2 diabetes mellitus with hyperglycemia, without long-term current use of insulin [facility])   [place] [facility] [place], Shea Stakes, NP   6 months ago Encounter for annual physical exam   [facility] [place], Shea Stakes, NP   9 months  ago Type 2 diabetes mellitus with hyperglycemia, without long-term current use of insulin [facility])   [place] [facility] [place], Shea Stakes, NP   1 year ago Essential hypertension   [place] [facility] [place], [place] W, NP   1 year ago Type 2 diabetes mellitus with hyperglycemia, without long-term current use of insulin [facility])   [place] [facility] Claiborne Rigg, NP       Future Appointments             In 1 month Claiborne Rigg, NP [facility]

## 2023-04-14 ENCOUNTER — Other Ambulatory Visit: Payer: Self-pay

## 2023-04-26 ENCOUNTER — Other Ambulatory Visit: Payer: Self-pay

## 2023-05-10 ENCOUNTER — Other Ambulatory Visit: Payer: Self-pay

## 2023-05-24 ENCOUNTER — Other Ambulatory Visit: Payer: Self-pay | Admitting: Nurse Practitioner

## 2023-05-24 DIAGNOSIS — M542 Cervicalgia: Secondary | ICD-10-CM

## 2023-05-24 MED ORDER — NAPROXEN 500 MG PO TABS
500.0000 mg | ORAL_TABLET | Freq: Two times a day (BID) | ORAL | 0 refills | Status: AC
Start: 2023-05-24 — End: ?
  Filled 2023-05-24 – 2023-10-11 (×3): qty 180, 90d supply, fill #0

## 2023-05-25 ENCOUNTER — Other Ambulatory Visit: Payer: Self-pay

## 2023-05-26 ENCOUNTER — Other Ambulatory Visit: Payer: Self-pay

## 2023-05-27 ENCOUNTER — Ambulatory Visit: Payer: Medicaid Other | Admitting: Nurse Practitioner

## 2023-07-06 ENCOUNTER — Other Ambulatory Visit: Payer: Self-pay | Admitting: Nurse Practitioner

## 2023-07-06 DIAGNOSIS — I10 Essential (primary) hypertension: Secondary | ICD-10-CM

## 2023-07-06 DIAGNOSIS — E785 Hyperlipidemia, unspecified: Secondary | ICD-10-CM

## 2023-07-07 ENCOUNTER — Other Ambulatory Visit: Payer: Self-pay

## 2023-07-07 MED ORDER — LISINOPRIL 10 MG PO TABS
10.0000 mg | ORAL_TABLET | Freq: Every day | ORAL | 1 refills | Status: DC
Start: 2023-07-07 — End: 2024-01-24
  Filled 2023-07-07: qty 90, 90d supply, fill #0
  Filled 2023-10-11 – 2023-10-17 (×2): qty 90, 90d supply, fill #1

## 2023-07-07 MED ORDER — ATORVASTATIN CALCIUM 40 MG PO TABS
40.0000 mg | ORAL_TABLET | Freq: Every day | ORAL | 1 refills | Status: DC
Start: 2023-07-07 — End: 2024-01-24
  Filled 2023-07-07: qty 90, 90d supply, fill #0
  Filled 2023-10-11 (×2): qty 90, 90d supply, fill #1

## 2023-07-07 NOTE — Telephone Encounter (Signed)
Requested Prescriptions  Pending Prescriptions Disp Refills   lisinopril (ZESTRIL) 10 MG tablet 90 tablet 1    Sig: Take 1 tablet (10 mg total) by mouth daily.     Cardiovascular:  ACE Inhibitors Passed - 07/06/2023  6:18 PM      Passed - Cr in normal range and within 180 days    Creat  Date Value Ref Range Status  01/02/2015 0.80 0.50 - 1.35 mg/dL Final   Creatinine, Ser  Date Value Ref Range Status  02/15/2023 0.86 0.76 - 1.27 mg/dL Final   Creatinine, Urine  Date Value Ref Range Status  09/14/2016 145 20 - 370 mg/dL Final         Passed - K in normal range and within 180 days    Potassium  Date Value Ref Range Status  02/15/2023 4.7 3.5 - 5.2 mmol/L Final         Passed - Patient is not pregnant      Passed - Last BP in normal range    BP Readings from Last 1 Encounters:  02/15/23 117/72         Passed - Valid encounter within last 6 months    Recent Outpatient Visits           4 months ago Type 2 diabetes mellitus with hyperglycemia, without long-term current use of insulin [facility])   [place] [facility] [place], Shea Stakes, NP   9 months ago Encounter for annual physical exam   [place] [facility] [place], [place] W, NP   1 year ago Type 2 diabetes mellitus with hyperglycemia, without long-term current use of insulin [facility])   [place] [facility] [place], Shea Stakes, NP   1 year ago Essential hypertension   [place] [facility] [place], [place] W, NP   2 years ago Type 2 diabetes mellitus with hyperglycemia, without long-term current use of insulin [facility])   [place] [facility] [place], Shea Stakes, NP       Future Appointments             Tomorrow Claiborne Rigg, NP [facility]             atorvastatin (LIPITOR) 40 MG tablet 90 tablet 1    Sig: Take 1 tablet (40 mg total) by mouth daily.      Cardiovascular:  Antilipid - Statins Failed - 07/06/2023  6:18 PM      Failed - Lipid Panel in normal range within the last 12 months    Cholesterol, Total  Date Value Ref Range Status  07/01/2022 115 100 - 199 mg/dL Final   LDL Chol Calc (NIH)  Date Value Ref Range Status  07/01/2022 53 0 - 99 mg/dL Final   HDL  Date Value Ref Range Status  07/01/2022 34 (L) >39 mg/dL Final   Triglycerides  Date Value Ref Range Status  07/01/2022 162 (H) 0 - 149 mg/dL Final         Passed - Patient is not pregnant      Passed - Valid encounter within last 12 months    Recent Outpatient Visits           4 months ago Type 2 diabetes mellitus with hyperglycemia, without long-term current use of insulin [facility])   [place] [facility] [place], Shea Stakes, NP   9 months  ago Encounter for annual physical exam   [place] [facility] [place], [place] W, NP   1 year ago Type 2 diabetes mellitus with hyperglycemia, without long-term current use of insulin [facility])   [place] [facility] [place], Shea Stakes, NP   1 year ago Essential hypertension   [place] [facility] [place], [place] W, NP   2 years ago Type 2 diabetes mellitus with hyperglycemia, without long-term current use of insulin [facility])   [place] [facility] [place], Shea Stakes, NP       Future Appointments             Tomorrow Claiborne Rigg, NP [facility]

## 2023-07-08 ENCOUNTER — Encounter: Payer: Self-pay | Admitting: Nurse Practitioner

## 2023-07-08 ENCOUNTER — Other Ambulatory Visit: Payer: Self-pay

## 2023-07-08 ENCOUNTER — Ambulatory Visit: Payer: Medicaid Other | Attending: Nurse Practitioner | Admitting: Nurse Practitioner

## 2023-07-08 VITALS — BP 111/73 | HR 87 | Temp 98.0°F | Ht 73.0 in | Wt 210.2 lb

## 2023-07-08 DIAGNOSIS — Z7985 Long-term (current) use of injectable non-insulin antidiabetic drugs: Secondary | ICD-10-CM

## 2023-07-08 DIAGNOSIS — F17200 Nicotine dependence, unspecified, uncomplicated: Secondary | ICD-10-CM

## 2023-07-08 DIAGNOSIS — E1165 Type 2 diabetes mellitus with hyperglycemia: Secondary | ICD-10-CM

## 2023-07-08 DIAGNOSIS — Z7984 Long term (current) use of oral hypoglycemic drugs: Secondary | ICD-10-CM

## 2023-07-08 DIAGNOSIS — R42 Dizziness and giddiness: Secondary | ICD-10-CM

## 2023-07-08 DIAGNOSIS — I700 Atherosclerosis of aorta: Secondary | ICD-10-CM

## 2023-07-08 DIAGNOSIS — I10 Essential (primary) hypertension: Secondary | ICD-10-CM

## 2023-07-08 DIAGNOSIS — Z23 Encounter for immunization: Secondary | ICD-10-CM | POA: Diagnosis not present

## 2023-07-08 LAB — POCT GLYCOSYLATED HEMOGLOBIN (HGB A1C): HbA1c, POC (controlled diabetic range): 7.6 % — AB (ref 0.0–7.0)

## 2023-07-08 MED ORDER — PREDNISONE 20 MG PO TABS
40.0000 mg | ORAL_TABLET | Freq: Every day | ORAL | 0 refills | Status: AC
Start: 2023-07-08 — End: 2023-07-13
  Filled 2023-07-08: qty 10, 5d supply, fill #0

## 2023-07-08 MED ORDER — VENTOLIN HFA 108 (90 BASE) MCG/ACT IN AERS
1.0000 | INHALATION_SPRAY | Freq: Four times a day (QID) | RESPIRATORY_TRACT | 2 refills | Status: AC | PRN
Start: 2023-07-08 — End: ?
  Filled 2023-07-08: qty 6.7, 25d supply, fill #0

## 2023-07-08 MED ORDER — TRULICITY 0.75 MG/0.5ML ~~LOC~~ SOAJ
0.7500 mg | SUBCUTANEOUS | 1 refills | Status: DC
Start: 2023-07-08 — End: 2024-03-28
  Filled 2023-07-08: qty 2, 28d supply, fill #0
  Filled 2023-08-15: qty 2, 28d supply, fill #1
  Filled 2023-10-11: qty 2, 28d supply, fill #2

## 2023-07-08 NOTE — Progress Notes (Signed)
Assessment & Plan:  Kyle Campos was seen today for medical management of chronic issues.  Diagnoses and all orders for this visit:  Type 2 diabetes mellitus with hyperglycemia, without long-term current use of insulin  Continue blood sugar control as discussed in office today, low carbohydrate diet, and regular physical exercise as tolerated, 150 minutes per week (30 min each day, 5 days per week, or 50 min 3 days per week). Keep blood sugar logs with fasting goal of 90-130 mg/dl, post prandial (after you eat) less than 180.  For Hypoglycemia: BS <60 and Hyperglycemia BS >400; contact the clinic ASAP. Annual eye exams and foot exams are recommended.  -     POCT glycosylated hemoglobin (Hb A1C) -     CMP14+EGFR -     Dulaglutide (TRULICITY) 0.75 MG/0.5ML SOPN; Inject 0.75 mg into the skin once a week.  Primary hypertension -     CMP14+EGFR Continue all antihypertensives as prescribed.  Reminded to bring in blood pressure log for follow  up appointment.  RECOMMENDATIONS: DASH/Mediterranean Diets are healthier choices for HTN.    Dizziness -     CMP14+EGFR -     Urinalysis, Complete -     US Carotid Duplex Bilateral; Future -     ECHOCARDIOGRAM COMPLETE; Future  Tobacco dependence -     albuterol (VENTOLIN HFA) 108 (90 Base) MCG/ACT inhaler; Inhale 1-2 puffs into the lungs every 6 (six) hours as needed for wheezing or shortness of breath. -     predniSONE (DELTASONE) 20 MG tablet; Take 2 tablets (40 mg total) by mouth daily with breakfast for 5 days.  Aortic atherosclerosis (HCC) -     US Carotid Duplex Bilateral; Future  Encounter for immunization -     Flu Vaccine Trivalent High Dose (Fluad)    Patient has been counseled on age-appropriate routine health concerns for screening and prevention. These are reviewed and up-to-date. Referrals have been placed accordingly. Immunizations are up-to-date or declined.    Subjective:   Chief Complaint  Patient presents with   Medical  Management of Chronic Issues    Dizzy spells   Kyle Campos 67 y.o. male presents to office today for follow up to HTN. He also has complaints of dizziness with standing and is a current smoker.  He has a past medical history of Colon polyps (03/14/2015), DM2, Diverticulosis, Hepatic steatosis, Hyperlipidemia, Hypertension, Lung nodules, and Smoker.     Dizziness This is a new problem. The current episode started 1 to 4 weeks ago. The problem has been waxing and waning. Associated symptoms include vertigo. Pertinent negatives include no chest pain, coughing, fever, headaches, myalgias, nausea or vomiting. The symptoms are aggravated by standing (after being in a sitting position). He has tried nothing for the symptoms.    DM 2 A1c slightly increased. He endorses dietary non adherence over the past few months. Currently taking metformin 1000 mg BID, trulicity 0.75 mg weekly as prescribed.  Lab Results  Component Value Date   HGBA1C 7.6 (A) 07/08/2023     Blood pressure is well controlled with lisinopril 10 mg daily.  BP Readings from Last 3 Encounters:  07/08/23 111/73  02/15/23 117/72  10/26/22 131/81      Review of Systems  Constitutional:  Negative for fever, malaise/fatigue and weight loss.  HENT: Negative.  Negative for nosebleeds.   Eyes: Negative.  Negative for blurred vision, double vision and photophobia.  Respiratory: Negative.  Negative for cough and shortness of breath.  Cardiovascular: Negative.  Negative for chest pain, palpitations and leg swelling.  Gastrointestinal: Negative.  Negative for heartburn, nausea and vomiting.  Musculoskeletal: Negative.  Negative for myalgias.  Neurological:  Positive for dizziness and vertigo. Negative for focal weakness, seizures and headaches.  Psychiatric/Behavioral: Negative.  Negative for suicidal ideas.     Past Medical History:  Diagnosis Date   Colon polyps 03/14/2015   Tubular adenoma x 6   Diabetes mellitus (HCC)     Diverticulosis    Hepatic steatosis    Hyperlipidemia    Hypertension    Lung nodules    Smoker     Past Surgical History:  Procedure Laterality Date   APPENDECTOMY  at age 58    colonoscopy  03/14/2015    Family History  Problem Relation Age of Onset   Diabetes Mother    Cancer Father        lung cancer    Stroke Maternal Grandfather    Colon polyps Brother    Colon cancer Neg Hx    Esophageal cancer Neg Hx    Rectal cancer Neg Hx    Stomach cancer Neg Hx     Social History Reviewed with no changes to be made today.   Outpatient Medications Prior to Visit  Medication Sig Dispense Refill   atorvastatin (LIPITOR) 40 MG tablet Take 1 tablet (40 mg total) by mouth daily. 90 tablet 1   Blood Glucose Monitoring Suppl (TRUE METRIX METER) w/Device KIT Monitor blood glucose levels twice per day 1 kit 0   cyclobenzaprine (FLEXERIL) 10 MG tablet Take 1 tablet (10 mg total) by mouth 3 (three) times daily as needed for muscle spasms. 60 tablet 0   glucose blood (TRUE METRIX BLOOD GLUCOSE TEST) test strip Monitor blood glucose levels twice per day 100 each 2   lisinopril (ZESTRIL) 10 MG tablet Take 1 tablet (10 mg total) by mouth daily. 90 tablet 1   metFORMIN (GLUCOPHAGE) 1000 MG tablet Take 1 tablet (1,000 mg total) by mouth 2 (two) times daily with a meal. 180 tablet 1   naproxen (NAPROSYN) 500 MG tablet Take 1 tablet (500 mg total) by mouth 2 (two) times daily with a meal. 180 tablet 0   TRUEplus Lancets 28G MISC Monitor blood glucose levels twice per day 100 each 2   albuterol (VENTOLIN HFA) 108 (90 Base) MCG/ACT inhaler Inhale 2 puffs into the lungs every 6 (six) hours as needed for wheezing or shortness of breath. 6.7 g 2   Dulaglutide (TRULICITY) 0.75 MG/0.5ML SOPN Inject 0.75 mg into the skin once a week. 6 mL 1   betamethasone dipropionate 0.05 % cream Apply topically 2 (two) times daily. (Patient not taking: Reported on 10/01/2022) 30 g 0   No facility-administered  medications prior to visit.    No Known Allergies     Objective:    BP 111/73   Pulse 87   Temp 98 F (36.7 C) (Oral)   Ht 6\' 1"  (1.854 m)   Wt 210 lb 3.2 oz (95.3 kg)   SpO2 95%   BMI 27.73 kg/m  Wt Readings from Last 3 Encounters:  07/08/23 210 lb 3.2 oz (95.3 kg)  02/21/23 208 lb (94.3 kg)  02/15/23 208 lb 9.6 oz (94.6 kg)    Physical Exam Vitals and nursing note reviewed.  Constitutional:      Appearance: He is well-developed.  HENT:     Head: Normocephalic and atraumatic.  Cardiovascular:     Rate and Rhythm: Normal  rate and regular rhythm.     Heart sounds: Normal heart sounds. No murmur heard.    No friction rub. No gallop.  Pulmonary:     Effort: Pulmonary effort is normal. No tachypnea or respiratory distress.     Breath sounds: Normal breath sounds. No decreased breath sounds, wheezing, rhonchi or rales.  Chest:     Chest wall: No tenderness.  Abdominal:     General: Bowel sounds are normal.     Palpations: Abdomen is soft.  Musculoskeletal:        General: Normal range of motion.     Cervical back: Normal range of motion.  Skin:    General: Skin is warm and dry.  Neurological:     Mental Status: He is alert and oriented to person, place, and time.     Coordination: Coordination normal.  Psychiatric:        Behavior: Behavior normal. Behavior is cooperative.        Thought Content: Thought content normal.        Judgment: Judgment normal.          Patient has been counseled extensively about nutrition and exercise as well as the importance of adherence with medications and regular follow-up. The patient was given clear instructions to go to ER or return to medical center if symptoms don't improve, worsen or new problems develop. The patient verbalized understanding.   Follow-up: Return in about 3 months (around 10/08/2023).   Claiborne Rigg, FNP-BC [facility] [place], [place] [phone]   07/08/2023, 1:09  PM

## 2023-07-09 LAB — CMP14+EGFR
ALT: 22 [IU]/L (ref 0–44)
AST: 14 [IU]/L (ref 0–40)
Albumin: 4.6 g/dL (ref 3.9–4.9)
Alkaline Phosphatase: 61 [IU]/L (ref 44–121)
BUN/Creatinine Ratio: 22 (ref 10–24)
BUN: 22 mg/dL (ref 8–27)
Bilirubin Total: 0.3 mg/dL (ref 0.0–1.2)
CO2: 24 mmol/L (ref 20–29)
Calcium: 9.5 mg/dL (ref 8.6–10.2)
Chloride: 102 mmol/L (ref 96–106)
Creatinine, Ser: 1.01 mg/dL (ref 0.76–1.27)
Globulin, Total: 2.5 g/dL (ref 1.5–4.5)
Glucose: 158 mg/dL — ABNORMAL HIGH (ref 70–99)
Potassium: 4.8 mmol/L (ref 3.5–5.2)
Sodium: 141 mmol/L (ref 134–144)
Total Protein: 7.1 g/dL (ref 6.0–8.5)
eGFR: 83 mL/min/{1.73_m2} (ref >59)

## 2023-07-09 LAB — URINALYSIS, COMPLETE
Bilirubin, UA: NEGATIVE
Ketones, UA: NEGATIVE
Leukocytes,UA: NEGATIVE
Nitrite, UA: NEGATIVE
Protein,UA: NEGATIVE
RBC, UA: NEGATIVE
Specific Gravity, UA: 1.025 (ref 1.005–1.030)
Urobilinogen, Ur: 0.2 mg/dL (ref 0.2–1.0)
pH, UA: 6 (ref 5.0–7.5)

## 2023-07-09 LAB — MICROSCOPIC EXAMINATION
Bacteria, UA: NONE SEEN
Casts: NONE SEEN /[LPF]
Epithelial Cells (non renal): NONE SEEN /[HPF] (ref 0–10)
RBC, Urine: NONE SEEN /[HPF] (ref 0–2)
WBC, UA: NONE SEEN /[HPF] (ref 0–5)

## 2023-07-12 ENCOUNTER — Other Ambulatory Visit: Payer: Self-pay

## 2023-07-12 ENCOUNTER — Telehealth: Payer: Self-pay

## 2023-07-12 NOTE — Telephone Encounter (Signed)
Pharmacy Patient Advocate Encounter  Received notification from [facility] that Prior Authorization for trulicity has been APPROVED from 07/08/2023 to 10/04/2023   PA #/Case ID/Reference #: [id number]

## 2023-07-13 ENCOUNTER — Ambulatory Visit
Admission: RE | Admit: 2023-07-13 | Discharge: 2023-07-13 | Disposition: A | Discharge status: Home / Self Care | Payer: Medicare Other | Source: Ambulatory Visit | Attending: Nurse Practitioner | Admitting: Nurse Practitioner

## 2023-07-13 DIAGNOSIS — I700 Atherosclerosis of aorta: Secondary | ICD-10-CM

## 2023-07-13 DIAGNOSIS — R42 Dizziness and giddiness: Secondary | ICD-10-CM

## 2023-07-25 ENCOUNTER — Ambulatory Visit (HOSPITAL_COMMUNITY): Payer: Medicare Other | Attending: Internal Medicine

## 2023-07-25 DIAGNOSIS — E785 Hyperlipidemia, unspecified: Secondary | ICD-10-CM | POA: Diagnosis not present

## 2023-07-25 DIAGNOSIS — E119 Type 2 diabetes mellitus without complications: Secondary | ICD-10-CM | POA: Insufficient documentation

## 2023-07-25 DIAGNOSIS — I10 Essential (primary) hypertension: Secondary | ICD-10-CM | POA: Insufficient documentation

## 2023-07-25 DIAGNOSIS — I2510 Atherosclerotic heart disease of native coronary artery without angina pectoris: Secondary | ICD-10-CM | POA: Insufficient documentation

## 2023-07-25 DIAGNOSIS — F17200 Nicotine dependence, unspecified, uncomplicated: Secondary | ICD-10-CM | POA: Diagnosis not present

## 2023-07-25 DIAGNOSIS — R42 Dizziness and giddiness: Secondary | ICD-10-CM | POA: Diagnosis not present

## 2023-07-25 LAB — ECHOCARDIOGRAM COMPLETE
AR max vel: 1.97 cm2
AV Area VTI: 2.3 cm2
AV Area mean vel: 2.27 cm2
AV Mean grad: 7 mm[Hg]
AV Peak grad: 18.5 mm[Hg]
Ao pk vel: 2.15 m/s
Area-P 1/2: 2.53 cm2
S' Lateral: 3 cm

## 2023-09-27 ENCOUNTER — Ambulatory Visit: Payer: Medicare Other

## 2023-10-01 DIAGNOSIS — R051 Acute cough: Secondary | ICD-10-CM | POA: Diagnosis not present

## 2023-10-01 DIAGNOSIS — R062 Wheezing: Secondary | ICD-10-CM | POA: Diagnosis not present

## 2023-10-01 DIAGNOSIS — J069 Acute upper respiratory infection, unspecified: Secondary | ICD-10-CM | POA: Diagnosis not present

## 2023-10-01 DIAGNOSIS — Z03818 Encounter for observation for suspected exposure to other biological agents ruled out: Secondary | ICD-10-CM | POA: Diagnosis not present

## 2023-10-11 ENCOUNTER — Other Ambulatory Visit: Payer: Self-pay

## 2023-10-11 ENCOUNTER — Telehealth: Payer: Self-pay

## 2023-10-11 ENCOUNTER — Other Ambulatory Visit: Payer: Self-pay | Admitting: Nurse Practitioner

## 2023-10-11 DIAGNOSIS — E1165 Type 2 diabetes mellitus with hyperglycemia: Secondary | ICD-10-CM

## 2023-10-11 MED ORDER — METFORMIN HCL 1000 MG PO TABS
1000.0000 mg | ORAL_TABLET | Freq: Two times a day (BID) | ORAL | 0 refills | Status: DC
  Filled 2023-10-11 (×2): qty 60, 30d supply, fill #0

## 2023-10-11 NOTE — Telephone Encounter (Signed)
 Pharmacy Patient Advocate Encounter   Received notification from CoverMyMeds that prior authorization for TRULICITY  is required/requested.   Insurance verification completed.   The patient is insured through [facility] .   Per test claim: PA required; PA submitted to above mentioned insurance via CoverMyMeds Key/confirmation #/EOC [id number] Status is pending

## 2023-10-14 ENCOUNTER — Other Ambulatory Visit: Payer: Self-pay

## 2023-10-14 ENCOUNTER — Ambulatory Visit: Payer: 59 | Admitting: Nurse Practitioner

## 2023-10-14 NOTE — Telephone Encounter (Signed)
 Pharmacy Patient Advocate Encounter  Received notification from [facility] that Prior Authorization for TRULICITY has been  10/05/2023 TO 10/03/2024   PA #/Case ID/Reference #: [id number]

## 2023-10-17 ENCOUNTER — Other Ambulatory Visit: Payer: Self-pay

## 2023-10-25 ENCOUNTER — Ambulatory Visit: Payer: 59 | Attending: Nurse Practitioner | Admitting: *Deleted

## 2023-10-25 DIAGNOSIS — Z0000 Encounter for general adult medical examination without abnormal findings: Secondary | ICD-10-CM

## 2023-10-25 DIAGNOSIS — Z1211 Encounter for screening for malignant neoplasm of colon: Secondary | ICD-10-CM

## 2023-10-25 NOTE — Patient Instructions (Signed)
Kyle Campos , Thank you for taking time to come for your Medicare Wellness Visit. I appreciate your ongoing commitment to your health goals. Please review the following plan we discussed and let me know if I can assist you in the future.   Screening recommendations/referrals: Colonoscopy: Education provided Recommended yearly ophthalmology/optometry visit for glaucoma screening and checkup Recommended yearly dental visit for hygiene and checkup  Vaccinations: Influenza vaccine: up to date Pneumococcal vaccine: Education provided Tdap vaccine: up to date Shingles vaccine: Education provided    Advanced directives: Education provided     Preventive Care 65 Years and Older, Male Preventive care refers to lifestyle choices and visits with your health care provider that can promote health and wellness. What does preventive care include? A yearly physical exam. This is also called an annual well check. Dental exams once or twice a year. Routine eye exams. Ask your health care provider how often you should have your eyes checked. Personal lifestyle choices, including: Daily care of your teeth and gums. Regular physical activity. Eating a healthy diet. Avoiding tobacco and drug use. Limiting alcohol use. Practicing safe sex. Taking low doses of aspirin every day. Taking vitamin and mineral supplements as recommended by your health care provider. What happens during an annual well check? The services and screenings done by your health care provider during your annual well check will depend on your age, overall health, lifestyle risk factors, and family history of disease. Counseling  Your health care provider may ask you questions about your: Alcohol use. Tobacco use. Drug use. Emotional well-being. Home and relationship well-being. Sexual activity. Eating habits. History of falls. Memory and ability to understand (cognition). Work and work Astronomer. Screening  You may have  the following tests or measurements: Height, weight, and BMI. Blood pressure. Lipid and cholesterol levels. These may be checked every 5 years, or more frequently if you are over 31 years old. Skin check. Lung cancer screening. You may have this screening every year starting at age 27 if you have a 30-pack-year history of smoking and currently smoke or have quit within the past 15 years. Fecal occult blood test (FOBT) of the stool. You may have this test every year starting at age 27. Flexible sigmoidoscopy or colonoscopy. You may have a sigmoidoscopy every 5 years or a colonoscopy every 10 years starting at age 11. Prostate cancer screening. Recommendations will vary depending on your family history and other risks. Hepatitis C blood test. Hepatitis B blood test. Sexually transmitted disease (STD) testing. Diabetes screening. This is done by checking your blood sugar (glucose) after you have not eaten for a while (fasting). You may have this done every 1-3 years. Abdominal aortic aneurysm (AAA) screening. You may need this if you are a current or former smoker. Osteoporosis. You may be screened starting at age 46 if you are at high risk. Talk with your health care provider about your test results, treatment options, and if necessary, the need for more tests. Vaccines  Your health care provider may recommend certain vaccines, such as: Influenza vaccine. This is recommended every year. Tetanus, diphtheria, and acellular pertussis (Tdap, Td) vaccine. You may need a Td booster every 10 years. Zoster vaccine. You may need this after age 46. Pneumococcal 13-valent conjugate (PCV13) vaccine. One dose is recommended after age 71. Pneumococcal polysaccharide (PPSV23) vaccine. One dose is recommended after age 26. Talk to your health care provider about which screenings and vaccines you need and how often you need them. This information  is not intended to replace advice given to you by your health  care provider. Make sure you discuss any questions you have with your health care provider. Document Released: 10/17/2015 Document Revised: 06/09/2016 Document Reviewed: 07/22/2015 Elsevier Interactive Patient Education  2017 [employer].  Fall Prevention in the Home Falls can cause injuries. They can happen to people of all ages. There are many things you can do to make your home safe and to help prevent falls. What can I do on the outside of my home? Regularly fix the edges of walkways and driveways and fix any cracks. Remove anything that might make you trip as you walk through a door, such as a raised step or threshold. Trim any bushes or trees on the path to your home. Use bright outdoor lighting. Clear any walking paths of anything that might make someone trip, such as rocks or tools. Regularly check to see if handrails are loose or broken. Make sure that both sides of any steps have handrails. Any raised decks and porches should have guardrails on the edges. Have any leaves, snow, or ice cleared regularly. Use sand or salt on walking paths during winter. Clean up any spills in your garage right away. This includes oil or grease spills. What can I do in the bathroom? Use night lights. Install grab bars by the toilet and in the tub and shower. Do not use towel bars as grab bars. Use non-skid mats or decals in the tub or shower. If you need to sit down in the shower, use a plastic, non-slip stool. Keep the floor dry. Clean up any water that spills on the floor as soon as it happens. Remove soap buildup in the tub or shower regularly. Attach bath mats securely with double-sided non-slip rug tape. Do not have throw rugs and other things on the floor that can make you trip. What can I do in the bedroom? Use night lights. Make sure that you have a light by your bed that is easy to reach. Do not use any sheets or blankets that are too big for your bed. They should not hang down onto the  floor. Have a firm chair that has side arms. You can use this for support while you get dressed. Do not have throw rugs and other things on the floor that can make you trip. What can I do in the kitchen? Clean up any spills right away. Avoid walking on wet floors. Keep items that you use a lot in easy-to-reach places. If you need to reach something above you, use a strong step stool that has a grab bar. Keep electrical cords out of the way. Do not use floor polish or wax that makes floors slippery. If you must use wax, use non-skid floor wax. Do not have throw rugs and other things on the floor that can make you trip. What can I do with my stairs? Do not leave any items on the stairs. Make sure that there are handrails on both sides of the stairs and use them. Fix handrails that are broken or loose. Make sure that handrails are as long as the stairways. Check any carpeting to make sure that it is firmly attached to the stairs. Fix any carpet that is loose or worn. Avoid having throw rugs at the top or bottom of the stairs. If you do have throw rugs, attach them to the floor with carpet tape. Make sure that you have a light switch at the top of  the stairs and the bottom of the stairs. If you do not have them, ask someone to add them for you. What else can I do to help prevent falls? Wear shoes that: Do not have high heels. Have rubber bottoms. Are comfortable and fit you well. Are closed at the toe. Do not wear sandals. If you use a stepladder: Make sure that it is fully opened. Do not climb a closed stepladder. Make sure that both sides of the stepladder are locked into place. Ask someone to hold it for you, if possible. Clearly mark and make sure that you can see: Any grab bars or handrails. First and last steps. Where the edge of each step is. Use tools that help you move around (mobility aids) if they are needed. These include: Canes. Walkers. Scooters. Crutches. Turn on the  lights when you go into a dark area. Replace any light bulbs as soon as they burn out. Set up your furniture so you have a clear path. Avoid moving your furniture around. If any of your floors are uneven, fix them. If there are any pets around you, be aware of where they are. Review your medicines with your doctor. Some medicines can make you feel dizzy. This can increase your chance of falling. Ask your doctor what other things that you can do to help prevent falls. This information is not intended to replace advice given to you by your health care provider. Make sure you discuss any questions you have with your health care provider. Document Released: 07/17/2009 Document Revised: 02/26/2016 Document Reviewed: 10/25/2014 Elsevier Interactive Patient Education  2017 [employer].

## 2023-10-25 NOTE — Progress Notes (Addendum)
Subjective:   Kyle Campos is a 68 y.o. male who presents for Medicare Annual/Subsequent preventive examination.  Visit Complete: Virtual I connected with  Kyle Campos on 10/25/23 by a audio enabled telemedicine application and verified that I am speaking with the correct person using two identifiers.  Patient Location: Home  Provider Location: Home Office  I discussed the limitations of evaluation and management by telemedicine. The patient expressed understanding and agreed to proceed.  Vital Signs: Because this visit was a virtual/telehealth visit, some criteria may be missing or patient reported. Any vitals not documented were not able to be obtained and vitals that have been documented are patient reported.   Cardiac Risk Factors include: advanced age (>69men, >58 women);hypertension;male gender;diabetes mellitus     Objective:    There were no vitals filed for this visit. There is no height or weight on file to calculate BMI.     10/25/2023    8:56 AM 08/07/2017   11:04 PM 07/07/2017    3:43 PM 02/24/2017    5:37 PM 02/10/2017    8:39 AM 12/03/2016    2:12 PM 09/14/2016    2:32 PM  Advanced Directives  Does Patient Have a Medical Advance Directive? No No No No No No No  Would patient like information on creating a medical advance directive? No - Patient declined No - Patient declined  No - Patient declined  No - Patient declined     Current Medications (verified) Outpatient Encounter Medications as of 10/25/2023  Medication Sig   albuterol (VENTOLIN HFA) 108 (90 Base) MCG/ACT inhaler Inhale 1-2 puffs into the lungs every 6 (six) hours as needed for wheezing or shortness of breath.   atorvastatin (LIPITOR) 40 MG tablet Take 1 tablet (40 mg total) by mouth daily.   Blood Glucose Monitoring Suppl (TRUE METRIX METER) w/Device KIT Monitor blood glucose levels twice per day   cyclobenzaprine (FLEXERIL) 10 MG tablet Take 1 tablet (10 mg total) by mouth 3 (three) times  daily as needed for muscle spasms.   Dulaglutide (TRULICITY) 0.75 MG/0.5ML SOAJ Inject 0.75 mg into the skin once a week.   glucose blood (TRUE METRIX BLOOD GLUCOSE TEST) test strip Monitor blood glucose levels twice per day   lisinopril (ZESTRIL) 10 MG tablet Take 1 tablet (10 mg total) by mouth daily.   metFORMIN (GLUCOPHAGE) 1000 MG tablet Take 1 tablet (1,000 mg total) by mouth 2 (two) times daily with a meal.   naproxen (NAPROSYN) 500 MG tablet Take 1 tablet (500 mg total) by mouth 2 (two) times daily with a meal.   TRUEplus Lancets 28G MISC Monitor blood glucose levels twice per day   betamethasone dipropionate 0.05 % cream Apply topically 2 (two) times daily. (Patient not taking: Reported on 10/25/2023)   No facility-administered encounter medications on file as of 10/25/2023.    Allergies (verified) Patient has no known allergies.   History: Past Medical History:  Diagnosis Date   Colon polyps 03/14/2015   Tubular adenoma x 6   Diabetes mellitus (HCC)    Diverticulosis    Hepatic steatosis    Hyperlipidemia    Hypertension    Lung nodules    Smoker    Past Surgical History:  Procedure Laterality Date   APPENDECTOMY  at age 59    colonoscopy  03/14/2015   Family History  Problem Relation Age of Onset   Diabetes Mother    Cancer Father        lung cancer  Stroke Maternal Grandfather    Colon polyps Brother    Colon cancer Neg Hx    Esophageal cancer Neg Hx    Rectal cancer Neg Hx    Stomach cancer Neg Hx    Social History   Socioeconomic History   Marital status: Single    Spouse name: Not on file   Number of children: Not on file   Years of education: Not on file   Highest education level: Not on file  Occupational History   Not on file  Tobacco Use   Smoking status: Every Day    Current packs/day: 1.00    Average packs/day: 1 pack/day for 40.0 years (40.0 ttl pk-yrs)    Types: Cigarettes   Smokeless tobacco: Never  Vaping Use   Vaping status:  Never Used  Substance and Sexual Activity   Alcohol use: Not Currently    Alcohol/week: 0.0 standard drinks of alcohol    Comment: once monthly   Drug use: No   Sexual activity: Not Currently  Other Topics Concern   Not on file  Social History Narrative   Not on file   Social Drivers of Health   Financial Resource Strain: Low Risk  (10/25/2023)   Overall Financial Resource Strain (CARDIA)    Difficulty of Paying Living Expenses: Not hard at all  Food Insecurity: No Food Insecurity (10/25/2023)   Hunger Vital Sign    Worried About Running Out of Food in the Last Year: Never true    Ran Out of Food in the Last Year: Never true  Transportation Needs: No Transportation Needs (10/25/2023)   PRAPARE - Administrator, Civil Service (Medical): No    Lack of Transportation (Non-Medical): No  Physical Activity: Insufficiently Active (10/25/2023)   Exercise Vital Sign    Days of Exercise per Week: 4 days    Minutes of Exercise per Session: 30 min  Stress: No Stress Concern Present (10/25/2023)   [employer] of Occupational Health - Occupational Stress Questionnaire    Feeling of Stress : Not at all  Social Connections: Moderately Isolated (10/25/2023)   Social Connection and Isolation Panel [NHANES]    Frequency of Communication with Friends and Family: More than three times a week    Frequency of Social Gatherings with Friends and Family: Once a week    Attends Religious Services: Never    Database administrator or Organizations: No    Attends Engineer, structural: Never    Marital Status: Living with partner    Tobacco Counseling Ready to quit: Not Answered Counseling given: Not Answered   Clinical Intake:  Pre-visit preparation completed: Yes  Pain : No/denies pain     Diabetes: Yes CBG done?: No Did pt. bring in CBG monitor from home?: No  How often do you need to have someone help you when you read instructions, pamphlets, or other written  materials from your doctor or pharmacy?: 1 - Never  Interpreter Needed?: No  Information entered by :: Remi Haggard LPN   Activities of Daily Living    10/25/2023    8:59 AM  In your present state of health, do you have any difficulty performing the following activities:  Hearing? 0  Vision? 0  Difficulty concentrating or making decisions? 0  Walking or climbing stairs? 0  Dressing or bathing? 0  Doing errands, shopping? 0  Preparing Food and eating ? N  Using the Toilet? N  In the past six months, have you  accidently leaked urine? N  Do you have problems with loss of bowel control? N  Managing your Medications? N  Managing your Finances? N  Housekeeping or managing your Housekeeping? N    Patient Care Team: Claiborne Rigg, NP as PCP - General (Nurse Practitioner)  Indicate any recent Medical Services you may have received from other than [facility] providers in the past year (date may be approximate).     Assessment:   This is a routine wellness examination for [place].  Hearing/Vision screen Hearing Screening - Comments:: No trouble hearing Vision Screening - Comments:: Not up to date Looking for MD now   Goals Addressed             This Visit's Progress    Weight (lb) < 200 lb (90.7 kg)       Get off some meds       Depression Screen    10/25/2023    9:02 AM 07/08/2023    8:50 AM 02/15/2023    9:34 AM 10/01/2022   10:42 AM 07/01/2022    8:54 AM 01/06/2022    3:24 PM 06/23/2021    8:40 AM  PHQ 2/9 Scores  PHQ - 2 Score 0 0 0 0 0 0 0  PHQ- 9 Score 0 0 0 0 0 2 0    Fall Risk    10/25/2023    8:55 AM 07/08/2023    8:50 AM 02/15/2023    9:34 AM 10/26/2022    3:12 PM 10/01/2022   10:23 AM  Fall Risk   Falls in the past year? 0 0 0 0 0  Number falls in past yr: 0 0 0 0 0  Injury with Fall? 0 0 0 0 0  Risk for fall due to :  No Fall Risks No Fall Risks No Fall Risks   Follow up Falls evaluation completed;Education provided;Falls prevention discussed Falls  evaluation completed Falls evaluation completed Falls evaluation completed Falls evaluation completed    MEDICARE RISK AT HOME:    TIMED UP AND GO:  Was the test performed?  No    Cognitive Function:        10/25/2023    9:00 AM  6CIT Screen  What time? 0 points  Count back from 20 0 points  Months in reverse 0 points  Repeat phrase 0 points    Immunizations Immunization History  Administered Date(s) Administered   Fluad Trivalent(High Dose 65+) 07/08/2023   Influenza,inj,Quad PF,6+ Mos 09/14/2016, 07/07/2017, 06/26/2018, 06/18/2019, 06/20/2020, 07/01/2022   Moderna Sars-Covid-2 Vaccination 01/21/2020, 02/16/2020   Pneumococcal Polysaccharide-23 01/02/2015   Tdap 02/10/2017    TDAP status: Up to date  Flu Vaccine status: Up to date  Pneumococcal vaccine status: Due, Education has been provided regarding the importance of this vaccine. Advised may receive this vaccine at local pharmacy or Health Dept. Aware to provide a copy of the vaccination record if obtained from local pharmacy or Health Dept. Verbalized acceptance and understanding.  Covid-19 vaccine status: Information provided on how to obtain vaccines.   Qualifies for Shingles Vaccine? Yes   Zostavax completed No   Shingrix Completed?: No.    Education has been provided regarding the importance of this vaccine. Patient has been advised to call insurance company to determine out of pocket expense if they have not yet received this vaccine. Advised may also receive vaccine at local pharmacy or Health Dept. Verbalized acceptance and understanding.  Screening Tests Health Maintenance  Topic Date Due   Zoster Vaccines-  Shingrix (1 of 2) Never done   Pneumonia Vaccine 24+ Years old (2 of 2 - PCV) 01/02/2016   FOOT EXAM  12/08/2019   OPHTHALMOLOGY EXAM  11/04/2021   Diabetic kidney evaluation - Urine ACR  10/02/2023   Colonoscopy  10/11/2023   COVID-19 Vaccine (3 - 2024-25 season) 11/23/2023 (Originally 06/05/2023)    HEMOGLOBIN A1C  01/06/2024   Lung Cancer Screening  02/21/2024   Diabetic kidney evaluation - eGFR measurement  07/07/2024   Medicare Annual Wellness (AWV)  10/24/2024   DTaP/Tdap/Td (2 - Td or Tdap) 02/11/2027   INFLUENZA VACCINE  Completed   Hepatitis C Screening  Completed   HIV Screening  Completed   HPV VACCINES  Aged Out    Health Maintenance  Health Maintenance Due  Topic Date Due   Zoster Vaccines- Shingrix (1 of 2) Never done   Pneumonia Vaccine 72+ Years old (2 of 2 - PCV) 01/02/2016   FOOT EXAM  12/08/2019   OPHTHALMOLOGY EXAM  11/04/2021   Diabetic kidney evaluation - Urine ACR  10/02/2023   Colonoscopy  10/11/2023    Colorectal cancer screening: Referral to GI placed  . Pt aware the office will call re: appt.  Lung Cancer Screening: (Low Dose CT Chest recommended if Age 47-80 years, 20 pack-year currently smoking OR have quit w/in 15years.) does qualify.   Lung Cancer Screening Referral: due 02-2024  Additional Screening:  Hepatitis C Screening: does not qualify; Completed 2017  Vision Screening: Recommended annual ophthalmology exams for early detection of glaucoma and other disorders of the eye. Is the patient up to date with their annual eye exam?  No  Who is the provider or what is the name of the office in which the patient attends annual eye exams? Education provided If pt is not established with a provider, would they like to be referred to a provider to establish care? No .   Dental Screening: Recommended annual dental exams for proper oral hygiene  Diabetic Foot Exam: Diabetic Foot Exam: Overdue, Pt has been advised about the importance in completing this exam. Pt is scheduled for diabetic foot exam on Education provided.  Nutrition Risk Assessment:  Has the patient had any N/V/D within the last 2 months?  No  Does the patient have any non-healing wounds?  No  Has the patient had any unintentional weight loss or weight gain?  No    Diabetes:  Is the patient diabetic?  Yes  If diabetic, was a CBG obtained today?  No  Did the patient bring in their glucometer from home?  No  How often do you monitor your CBG's? Does not Check   Education provided.   Financial Strains and Diabetes Management:  Are you having any financial strains with the device, your supplies or your medication? No .  Does the patient want to be seen by Chronic Care Management for management of their diabetes?  No  Would the patient like to be referred to a Nutritionist or for Diabetic Management?  No   Diabetic Exams:  Diabetic Eye Exam: Pt has been advised about the importance in completing this exam.    Diabetic Foot Exam:  Pt has been advised about the importance in completing this exam.   Community Resource Referral / Chronic Care Management: CRR required this visit?  No   CCM required this visit?  No     Plan:     I have personally reviewed and noted the following in the patient's chart:  Medical and social history Use of alcohol, tobacco or illicit drugs  Current medications and supplements including opioid prescriptions. Patient is not currently taking opioid prescriptions. Functional ability and status Nutritional status Physical activity Advanced directives List of other physicians Hospitalizations, surgeries, and ER visits in previous 12 months Vitals Screenings to include cognitive, depression, and falls Referrals and appointments  In addition, I have reviewed and discussed with patient certain preventive protocols, quality metrics, and best practice recommendations. A written personalized care plan for preventive services as well as general preventive health recommendations were provided to patient.     Remi Haggard, LPN   6/60/[id number]   After Visit Summary: (MyChart) Due to this being a telephonic visit, the after visit summary with patients personalized plan was offered to patient via MyChart   Nurse Notes:

## 2023-11-07 ENCOUNTER — Other Ambulatory Visit: Payer: Self-pay

## 2023-11-08 ENCOUNTER — Ambulatory Visit
Admission: EM | Admit: 2023-11-08 | Discharge: 2023-11-08 | Disposition: A | Discharge status: Home / Self Care | Payer: 59

## 2023-11-08 ENCOUNTER — Other Ambulatory Visit: Payer: Self-pay

## 2023-11-09 ENCOUNTER — Ambulatory Visit
Admission: EM | Admit: 2023-11-09 | Discharge: 2023-11-09 | Disposition: A | Discharge status: Home / Self Care | Payer: 59 | Attending: Family Medicine | Admitting: Family Medicine

## 2023-11-09 DIAGNOSIS — J069 Acute upper respiratory infection, unspecified: Secondary | ICD-10-CM

## 2023-11-09 LAB — POCT INFLUENZA A/B
Influenza A, POC: NEGATIVE
Influenza B, POC: NEGATIVE

## 2023-11-09 MED ORDER — AZITHROMYCIN 250 MG PO TABS: ORAL_TABLET | ORAL | 0 refills | Status: DC

## 2023-11-09 MED ORDER — FLUTICASONE PROPIONATE 50 MCG/ACT NA SUSP: 2.0000 | Freq: Every day | NASAL | 0 refills | Status: AC

## 2023-11-09 NOTE — ED Provider Notes (Signed)
 [name]    CSN: [id number] Arrival date & time: 11/09/23  0801      History   Chief Complaint Chief Complaint  Patient presents with   Nasal Congestion    HPI Kyle Campos is a 68 y.o. male.   Patient has had 4 days of illness.  Headache and sinus congestion, body aches and fatigue, coughing and chest congestion.  He is coughing up green sputum.  He is a smoker.  No known exposure to flu or COVID    Past Medical History:  Diagnosis Date   Colon polyps 03/14/2015   Tubular adenoma x 6   Diabetes mellitus (HCC)    Diverticulosis    Hepatic steatosis    Hyperlipidemia    Hypertension    Lung nodules    Smoker     Patient Active Problem List   Diagnosis Date Noted   Type 2 diabetes mellitus with hyperglycemia, without long-term current use of insulin (HCC) 02/15/2023   Mixed hyperlipidemia 11/24/2017   Chronic midline thoracic back pain 09/15/2016   Essential hypertension 09/15/2016   Chronic midline posterior neck pain 06/27/2016   Tinea pedis 06/19/2015   Smoking 01/02/2015    Past Surgical History:  Procedure Laterality Date   APPENDECTOMY  at age 62    colonoscopy  03/14/2015       Home Medications    Prior to Admission medications   Medication Sig Start Date End Date Taking? Authorizing Provider  azithromycin  (ZITHROMAX  Z-PAK) 250 MG tablet Take two pills today followed by one a day until gone 11/09/23  Yes Maranda Jamee Jacob, MD  fluticasone  (FLONASE ) 50 MCG/ACT nasal spray Place 2 sprays into both nostrils daily. 11/09/23  Yes Maranda Jamee Jacob, MD  albuterol  (VENTOLIN  HFA) 108 [phone] Base) MCG/ACT inhaler Inhale 1-2 puffs into the lungs every 6 (six) hours as needed for wheezing or shortness of breath. 07/08/23   Fleming, Zelda W, NP  atorvastatin  (LIPITOR) 40 MG tablet Take 1 tablet (40 mg total) by mouth daily. 07/07/23   Fleming, Zelda W, NP  betamethasone  dipropionate 0.05 % cream Apply topically 2 (two) times daily. Patient not  taking: Reported on 10/25/2023 07/01/22   Fleming, Zelda W, NP  Blood Glucose Monitoring Suppl (TRUE METRIX METER) w/Device KIT Monitor blood glucose levels twice per day 01/20/21   Newlin, Enobong, MD  cyclobenzaprine  (FLEXERIL ) 10 MG tablet Take 1 tablet (10 mg total) by mouth 3 (three) times daily as needed for muscle spasms. 09/19/20   Fleming, Zelda W, NP  Dulaglutide  (TRULICITY ) 0.75 MG/0.5ML SOAJ Inject 0.75 mg into the skin once a week. 07/08/23   Fleming, Zelda W, NP  glucose blood (TRUE METRIX BLOOD GLUCOSE TEST) test strip Monitor blood glucose levels twice per day 01/20/21   Newlin, Enobong, MD  lisinopril  (ZESTRIL ) 10 MG tablet Take 1 tablet (10 mg total) by mouth daily. 07/07/23   Fleming, Zelda W, NP  metFORMIN  (GLUCOPHAGE ) 1000 MG tablet Take 1 tablet (1,000 mg total) by mouth 2 (two) times daily with a meal. 10/11/23   Delbert Clam, MD  naproxen  (NAPROSYN ) 500 MG tablet Take 1 tablet (500 mg total) by mouth 2 (two) times daily with a meal. 05/24/23   Delbert Clam, MD  TRUEplus Lancets 28G MISC Monitor blood glucose levels twice per day 01/20/21   Delbert Clam, MD    Family History Family History  Problem Relation Age of Onset   Diabetes Mother    Cancer Father  lung cancer    Stroke Maternal Grandfather    Colon polyps Brother    Colon cancer Neg Hx    Esophageal cancer Neg Hx    Rectal cancer Neg Hx    Stomach cancer Neg Hx     Social History Social History   Tobacco Use   Smoking status: Every Day    Current packs/day: 1.00    Average packs/day: 1 pack/day for 40.0 years (40.0 ttl pk-yrs)    Types: Cigarettes   Smokeless tobacco: Never  Vaping Use   Vaping status: Never Used  Substance Use Topics   Alcohol use: Not Currently    Alcohol/week: 0.0 standard drinks of alcohol    Comment: once monthly   Drug use: No     Allergies   Patient has no known allergies.   Review of Systems Review of Systems See HPI  Physical Exam Triage Vital Signs ED  Triage Vitals [11/09/23 0816]  Encounter Vitals Group     BP (!) 147/78     Systolic BP Percentile      Diastolic BP Percentile      Pulse Rate 85     Resp 17     Temp 97.9 F (36.6 C)     Temp Source Oral     SpO2 94 %     Weight      Height      Head Circumference      Peak Flow      Pain Score 0     Pain Loc      Pain Education      Exclude from Growth Chart    No data found.  Updated Vital Signs BP (!) 147/78 (BP Location: Right Arm)   Pulse 85   Temp 97.9 F (36.6 C) (Oral)   Resp 17   SpO2 94%       Physical Exam Constitutional:      General: He is not in acute distress.    Appearance: He is well-developed.  HENT:     Head: Normocephalic and atraumatic.     Right Ear: Tympanic membrane normal.     Left Ear: Tympanic membrane normal.     Nose: Congestion present.     Mouth/Throat:     Pharynx: No posterior oropharyngeal erythema.  Eyes:     Conjunctiva/sclera: Conjunctivae normal.     Pupils: Pupils are equal, round, and reactive to light.  Cardiovascular:     Rate and Rhythm: Normal rate.     Heart sounds: Normal heart sounds.  Pulmonary:     Effort: Pulmonary effort is normal. No respiratory distress.     Breath sounds: Rhonchi present.  Abdominal:     General: There is no distension.     Palpations: Abdomen is soft.  Musculoskeletal:        General: Normal range of motion.     Cervical back: Normal range of motion.  Skin:    General: Skin is warm and dry.  Neurological:     Mental Status: He is alert.      UC Treatments / Results  Labs (all labs ordered are listed, but only abnormal results are displayed) Labs Reviewed  POCT INFLUENZA A/B    EKG   Radiology No results found.  Procedures Procedures (including critical care time)  Medications Ordered in UC Medications - No data to display  Initial Impression / Assessment and Plan / UC Course  I have reviewed the triage vital signs and the nursing  notes.  Pertinent labs &  imaging results that were available during my care of the patient were reviewed by me and considered in my medical decision making (see chart for details).      Final Clinical Impressions(s) / UC Diagnoses   Final diagnoses:  Upper respiratory tract infection, unspecified type     Discharge Instructions      The flu test is negative Take the Z-Pak as directed.  This is for the cough and bronchitis May use Flonase  for the head congestion. Make sure you are drinking plenty of fluids May use over-the-counter cough medicines as needed     ED Prescriptions     Medication Sig Dispense Auth. Provider   azithromycin  (ZITHROMAX  Z-PAK) 250 MG tablet Take two pills today followed by one a day until gone 6 tablet Maranda Jamee Jacob, MD   fluticasone  (FLONASE ) 50 MCG/ACT nasal spray Place 2 sprays into both nostrils daily. 16 g Maranda Jamee Jacob, MD      PDMP not reviewed this encounter.   Maranda Jamee Jacob, MD 11/09/23 [phone]

## 2023-11-09 NOTE — Discharge Instructions (Signed)
 The flu test is negative Take the Z-Pak as directed.  This is for the cough and bronchitis May use Flonase  for the head congestion. Make sure you are drinking plenty of fluids May use over-the-counter cough medicines as needed

## 2023-11-09 NOTE — ED Triage Notes (Addendum)
 Pt c/o nasal and chest congestion since Sat. Non productive cough. Denies fever. No known covid or flu exposure. Taking robitussin prn.

## 2023-11-15 ENCOUNTER — Other Ambulatory Visit: Payer: Self-pay

## 2023-11-29 ENCOUNTER — Ambulatory Visit: Payer: 59 | Attending: Nurse Practitioner | Admitting: Nurse Practitioner

## 2023-11-29 ENCOUNTER — Encounter: Payer: Self-pay | Admitting: Nurse Practitioner

## 2023-11-29 VITALS — BP 130/60 | HR 89 | Resp 20 | Ht 73.0 in | Wt 209.6 lb

## 2023-11-29 DIAGNOSIS — Z7985 Long-term (current) use of injectable non-insulin antidiabetic drugs: Secondary | ICD-10-CM | POA: Diagnosis not present

## 2023-11-29 DIAGNOSIS — F17210 Nicotine dependence, cigarettes, uncomplicated: Secondary | ICD-10-CM | POA: Diagnosis not present

## 2023-11-29 DIAGNOSIS — E1165 Type 2 diabetes mellitus with hyperglycemia: Secondary | ICD-10-CM

## 2023-11-29 DIAGNOSIS — J440 Chronic obstructive pulmonary disease with acute lower respiratory infection: Secondary | ICD-10-CM

## 2023-11-29 DIAGNOSIS — F17200 Nicotine dependence, unspecified, uncomplicated: Secondary | ICD-10-CM

## 2023-11-29 DIAGNOSIS — E782 Mixed hyperlipidemia: Secondary | ICD-10-CM

## 2023-11-29 DIAGNOSIS — Z1211 Encounter for screening for malignant neoplasm of colon: Secondary | ICD-10-CM

## 2023-11-29 LAB — POCT GLYCOSYLATED HEMOGLOBIN (HGB A1C): Hemoglobin A1C: 8.1 % — AB (ref 4.0–5.6)

## 2023-11-29 MED ORDER — TRULICITY 1.5 MG/0.5ML ~~LOC~~ SOAJ: 1.5000 mg | SUBCUTANEOUS | 3 refills | Status: DC

## 2023-11-29 MED ORDER — ALBUTEROL SULFATE (2.5 MG/3ML) 0.083% IN NEBU: 2.5000 mg | INHALATION_SOLUTION | Freq: Four times a day (QID) | RESPIRATORY_TRACT | 1 refills | Status: AC | PRN

## 2023-11-29 MED ORDER — BUDESONIDE-FORMOTEROL FUMARATE 80-4.5 MCG/ACT IN AERO: 2.0000 | INHALATION_SPRAY | Freq: Two times a day (BID) | RESPIRATORY_TRACT | 3 refills | Status: DC

## 2023-11-29 MED ORDER — FREESTYLE LIBRE 3 SENSOR MISC: 0 refills | Status: DC

## 2023-11-29 MED ORDER — FREESTYLE LIBRE 3 READER DEVI: 99 refills | Status: DC

## 2023-11-29 NOTE — Progress Notes (Signed)
 I have seen and examined this patient with the advanced practice provider STUDENT and agree with the note below

## 2023-11-29 NOTE — Progress Notes (Signed)
Pneu 

## 2023-11-29 NOTE — Progress Notes (Signed)
 .  Assessment & Plan:  Kyle Campos was seen today for medical management of chronic issues.  Diagnoses and all orders for this visit:  Type 2 diabetes mellitus with hyperglycemia, without long-term current use of insulin (HCC) -     POCT glycosylated hemoglobin (Hb A1C) -     CMP14+EGFR -     Microalbumin/Creatinine Ratio, Urine -     Dulaglutide (TRULICITY) 1.5 MG/0.5ML SOAJ; Inject 1.5 mg into the skin once a week.  Continuous Glucose Sensor (FREESTYLE LIBRE 3 SENSOR) MISC; Check blood sugars continuously E11.65 -     Continuous Glucose Receiver (FREESTYLE LIBRE 3 READER) DEVI; Check blood sugars continuously E11.65 Reduce or eliminate highly processed foods such as sugar, and low fat milk Eat more vegetables, and lean protein Freestyle Libre 3 plus to monitor blood sugar closely, change every 14 days  Colon cancer screening -     Ambulatory referral to Gastroenterology  Tobacco dependence with current use -     CT CHEST LUNG CA SCREEN LOW DOSE W/O CM; Future -     budesonide-formoterol (SYMBICORT) 80-4.5 MCG/ACT inhaler; Inhale 2 puffs into the lungs 2 (two) times daily. Smoking cessation while having respiratory symptoms  Mixed hyperlipidemia -     Lipid Panel Continue Lipitor   Chronic obstructive pulmonary disease with acute lower respiratory infection (HCC) -     For home use only DME Nebulizer machine -     albuterol (PROVENTIL) (2.5 MG/3ML) 0.083% nebulizer solution; Take 3 mLs (2.5 mg total) by nebulization every 6 (six) hours as needed for wheezing or shortness of breath. -     budesonide-formoterol (SYMBICORT) 80-4.5 MCG/ACT inhaler; Inhale 2 puffs into the lungs 2 (two) times daily. Use medications as needed to help reduce respiratory symptoms    Patient has been counseled on age-appropriate routine health concerns for screening and prevention. These are reviewed and up-to-date. Referrals have been placed accordingly. Immunizations are up-to-date or declined.     Subjective:   Chief Complaint  Patient presents with   Medical Management of Chronic Issues    Kyle Campos 68 y.o. male presents to office today for follow up today on diabetes and URI symptoms. Patient was seen in emergency room a few weeks ago for cough, congestion and body aches. Was treated for acute bronchitis with azithromycin and Flonase. States his symptoms improved but recently started having runny nose, and congestion. Denies shortness of breath, chest pain, body aches, chills or fever. Reports completing full dose of antibiotics but not taking any medications for the symptoms. Patient states he does not use his prescribed albuterol inhaler. States job is physically demanding, requiring he does a lot of walking. He still smokes but had to decrease number of cigarettes he smokes due to his respiratory symptoms. He states he drinks 3 gallons of milk per week. And continues to have 3-4 teaspoons of sugar with coffee in the morning. Will use his Nicotine patch, and reduce smoking.    COPD Mr Foree is a 32 pack year smoker. 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 Continues smoking with the following symptoms: cough, wheezing, and congestion. Symptoms began several years ago. He is currently prescribed albuterol inhaler which he sometimes uses several times per week. Recently treated for URI and would benefit from nebulizer machine.  Review of Systems  Constitutional: Negative.  Negative for chills, fever and malaise/fatigue.  HENT:  Positive for congestion. Negative for sore throat.   Eyes: Negative.   Respiratory:  Positive for wheezing.  Negative for cough.   Cardiovascular: Negative.  Negative for chest pain.  Gastrointestinal: Negative.   Genitourinary: Negative.   Musculoskeletal: Negative.   Skin: Negative.  Negative for rash.  Neurological: Negative.   Endo/Heme/Allergies: Negative.   Psychiatric/Behavioral: Negative.  Negative for depression.    Past Medical History:   Diagnosis Date   Colon polyps 03/14/2015   Tubular adenoma x 6   Diabetes mellitus (HCC)    Diverticulosis    Hepatic steatosis    Hyperlipidemia    Hypertension    Lung nodules    Smoker     Past Surgical History:  Procedure Laterality Date   APPENDECTOMY  at age 89    colonoscopy  03/14/2015    Family History  Problem Relation Age of Onset   Diabetes Mother    Cancer Father        lung cancer    Stroke Maternal Grandfather    Colon polyps Brother    Colon cancer Neg Hx    Esophageal cancer Neg Hx    Rectal cancer Neg Hx    Stomach cancer Neg Hx     Social History Reviewed with no changes to be made today.   Outpatient Medications Prior to Visit  Medication Sig Dispense Refill   albuterol (VENTOLIN HFA) 108 (90 Base) MCG/ACT inhaler Inhale 1-2 puffs into the lungs every 6 (six) hours as needed for wheezing or shortness of breath. 6.7 g 2   atorvastatin (LIPITOR) 40 MG tablet Take 1 tablet (40 mg total) by mouth daily. 90 tablet 1   Blood Glucose Monitoring Suppl (TRUE METRIX METER) w/Device KIT Monitor blood glucose levels twice per day 1 kit 0   Dulaglutide (TRULICITY) 0.75 MG/0.5ML SOAJ Inject 0.75 mg into the skin once a week. 6 mL 1   glucose blood (TRUE METRIX BLOOD GLUCOSE TEST) test strip Monitor blood glucose levels twice per day 100 each 2   lisinopril (ZESTRIL) 10 MG tablet Take 1 tablet (10 mg total) by mouth daily. 90 tablet 1   metFORMIN (GLUCOPHAGE) 1000 MG tablet Take 1 tablet (1,000 mg total) by mouth 2 (two) times daily with a meal. 60 tablet 0   naproxen (NAPROSYN) 500 MG tablet Take 1 tablet (500 mg total) by mouth 2 (two) times daily with a meal. 180 tablet 0   TRUEplus Lancets 28G MISC Monitor blood glucose levels twice per day 100 each 2   azithromycin (ZITHROMAX Z-PAK) 250 MG tablet Take two pills today followed by one a day until gone (Patient not taking: Reported on 11/29/2023) 6 tablet 0   betamethasone dipropionate 0.05 % cream Apply topically  2 (two) times daily. (Patient not taking: Reported on 10/01/2022) 30 g 0   cyclobenzaprine (FLEXERIL) 10 MG tablet Take 1 tablet (10 mg total) by mouth 3 (three) times daily as needed for muscle spasms. (Patient not taking: Reported on 11/29/2023) 60 tablet 0   fluticasone (FLONASE) 50 MCG/ACT nasal spray Place 2 sprays into both nostrils daily. (Patient not taking: Reported on 11/29/2023) 16 g 0   No facility-administered medications prior to visit.    No Known Allergies     Objective:    BP 130/60 (BP Location: Left Arm, Patient Position: Sitting, Cuff Size: Normal)   Pulse 89   Resp 20   Ht 6\' 1"  (1.854 m)   Wt 209 lb 9.6 oz (95.1 kg)   SpO2 100%   BMI 27.65 kg/m  Wt Readings from Last 3 Encounters:  11/29/23 209 lb 9.6  oz (95.1 kg)  07/08/23 210 lb 3.2 oz (95.3 kg)  02/21/23 208 lb (94.3 kg)  .Marland Kitchen This SmartLink has not been configured with any valid records.  .. BP Readings from Last 3 Encounters:  11/29/23 130/60  11/09/23 (!) 147/78  07/08/23 111/73     Physical Exam Constitutional:      Appearance: Normal appearance.  HENT:     Head: Normocephalic.     Right Ear: External ear normal.     Left Ear: External ear normal.     Nose: Congestion present.  Eyes:     General: Lids are normal.  Cardiovascular:     Rate and Rhythm: Normal rate and regular rhythm.     Pulses: Normal pulses.          Dorsalis pedis pulses are 2+ on the right side and 2+ on the left side.       Posterior tibial pulses are 2+ on the right side and 2+ on the left side.     Heart sounds: Normal heart sounds.  Pulmonary:     Effort: Pulmonary effort is normal.     Breath sounds: No decreased air movement. Examination of the left-upper field reveals wheezing and rhonchi. Examination of the right-middle field reveals rhonchi. Examination of the left-middle field reveals wheezing and rhonchi. Examination of the right-lower field reveals wheezing and rhonchi. Examination of the left-lower field  reveals wheezing and rhonchi. Wheezing and rhonchi present.  Chest:     Chest wall: No deformity or tenderness.  Musculoskeletal:     Cervical back: Normal range of motion.     Right lower leg: No edema.     Left lower leg: No edema.     Right foot: Normal range of motion. No bunion or foot drop.     Left foot: Normal range of motion. No bunion or foot drop.  Feet:     Right foot:     Protective Sensation: 10 sites tested.  10 sites sensed.     Skin integrity: Skin integrity normal. No blister, skin breakdown or callus.     Toenail Condition: Right toenails are long.     Left foot:     Protective Sensation: 10 sites tested.  10 sites sensed.     Skin integrity: Skin integrity normal. No blister, skin breakdown or callus.     Toenail Condition: Left toenails are long.  Skin:    General: Skin is warm and dry.  Neurological:     Mental Status: He is alert and oriented to person, place, and time.     Gait: Gait is intact.  Psychiatric:        Attention and Perception: Attention normal.        Mood and Affect: Mood normal.        Behavior: Behavior is cooperative.        Cognition and Memory: Cognition normal.        Judgment: Judgment normal.        Patient has been counseled extensively about nutrition and exercise as well as the importance of adherence with medications and regular follow-up. The patient was given clear instructions to go to ER or return to medical center if symptoms don't improve, worsen or new problems develop. The patient verbalized understanding.   Follow-up: Return in about 3 months (around 02/26/2024).   Joette Catching, BSN, RN -Student FNP [facility] [place], [place] [phone]   11/29/2023, 10:25 AM

## 2023-11-29 NOTE — Patient Instructions (Signed)
 [facility]  [address] [place], [place] [address] PH# [phone]

## 2023-12-02 LAB — CMP14+EGFR
ALT: 19 [IU]/L (ref 0–44)
AST: 13 [IU]/L (ref 0–40)
Albumin: 4.6 g/dL (ref 3.9–4.9)
Alkaline Phosphatase: 59 [IU]/L (ref 44–121)
BUN/Creatinine Ratio: 28 — ABNORMAL HIGH (ref 10–24)
BUN: 25 mg/dL (ref 8–27)
Bilirubin Total: 0.3 mg/dL (ref 0.0–1.2)
CO2: 23 mmol/L (ref 20–29)
Calcium: 9.8 mg/dL (ref 8.6–10.2)
Chloride: 102 mmol/L (ref 96–106)
Creatinine, Ser: 0.89 mg/dL (ref 0.76–1.27)
Globulin, Total: 2.6 g/dL (ref 1.5–4.5)
Glucose: 238 mg/dL — ABNORMAL HIGH (ref 70–99)
Potassium: 5.1 mmol/L (ref 3.5–5.2)
Sodium: 139 mmol/L (ref 134–144)
Total Protein: 7.2 g/dL (ref 6.0–8.5)
eGFR: 95 mL/min/{1.73_m2} (ref >59)

## 2023-12-02 LAB — MICROALBUMIN / CREATININE URINE RATIO
Creatinine, Urine: 91 mg/dL
Microalb/Creat Ratio: 58 mg/g{creat} — ABNORMAL HIGH (ref 0–29)
Microalbumin, Urine: 52.6 ug/mL

## 2023-12-02 LAB — LIPID PANEL
Chol/HDL Ratio: 3.8 {ratio} (ref 0.0–5.0)
Cholesterol, Total: 139 mg/dL (ref 100–199)
HDL: 37 mg/dL — ABNORMAL LOW (ref >39)
LDL Chol Calc (NIH): 60 mg/dL (ref 0–99)
Triglycerides: 263 mg/dL — ABNORMAL HIGH (ref 0–149)
VLDL Cholesterol Cal: 42 mg/dL — ABNORMAL HIGH (ref 5–40)

## 2023-12-05 ENCOUNTER — Telehealth: Payer: Self-pay

## 2023-12-05 NOTE — Telephone Encounter (Signed)
 Pt was called and vm was left, Information has been sent to nurse pool.

## 2023-12-05 NOTE — Telephone Encounter (Signed)
-----   Message from Claiborne Rigg sent at 12/04/2023  5:24 PM EST ----- Cholesterol levels are elevated. Make sure to take your atorvastatin every day to help reduce your risk of heart disease.   Kidney, liver function and electrolytes are normal.    Urine shows microscopic diabetic kidney changes. Make sure you are taking your lisinopril 10 mg daily.

## 2023-12-06 NOTE — Telephone Encounter (Signed)
 Attempted to contact patient and VM was left.   Copied from CRM [id number]. Topic: Clinical - Lab/Test Results >> Dec 05, 2023  2:50 PM Kyle Campos wrote: Reason for CRM: Patient missed a call about lab results, patients callback number is [phone].

## 2024-01-20 ENCOUNTER — Other Ambulatory Visit: Payer: Self-pay

## 2024-01-24 ENCOUNTER — Other Ambulatory Visit: Payer: Self-pay

## 2024-01-24 ENCOUNTER — Other Ambulatory Visit: Payer: Self-pay | Admitting: Nurse Practitioner

## 2024-01-24 DIAGNOSIS — I10 Essential (primary) hypertension: Secondary | ICD-10-CM

## 2024-01-24 DIAGNOSIS — M542 Cervicalgia: Secondary | ICD-10-CM

## 2024-01-24 DIAGNOSIS — E1165 Type 2 diabetes mellitus with hyperglycemia: Secondary | ICD-10-CM

## 2024-01-24 DIAGNOSIS — E785 Hyperlipidemia, unspecified: Secondary | ICD-10-CM

## 2024-01-24 MED ORDER — NAPROXEN 500 MG PO TABS: 500.0000 mg | ORAL_TABLET | Freq: Two times a day (BID) | ORAL | 0 refills | Status: DC

## 2024-01-24 MED ORDER — METFORMIN HCL 1000 MG PO TABS: 1000.0000 mg | ORAL_TABLET | Freq: Two times a day (BID) | ORAL | 1 refills | Status: DC

## 2024-01-24 MED ORDER — LISINOPRIL 10 MG PO TABS
10.0000 mg | ORAL_TABLET | Freq: Every day | ORAL | 1 refills | Status: DC
  Filled 2024-01-24: qty 90, 90d supply, fill #0

## 2024-01-24 MED ORDER — ATORVASTATIN CALCIUM 40 MG PO TABS: 40.0000 mg | ORAL_TABLET | Freq: Every day | ORAL | 1 refills | Status: DC

## 2024-01-24 NOTE — Telephone Encounter (Signed)
 Copied from CRM [id number]. Topic: Clinical - Medication Refill >> Jan 24, 2024  3:56 PM Everette C wrote: Most Recent Primary Care Visit:  Provider: Winda Hastings W  Department: [facility]  Visit Type: OFFICE VISIT  Date: 11/29/2023  Medication: Rx #: [id number]  metFORMIN  (GLUCOPHAGE ) 1000 MG tablet [[id number]]   Rx #: [id number]  atorvastatin  (LIPITOR) 40 MG tablet [[id number]]   Rx #: [id number]  lisinopril  (ZESTRIL ) 10 MG tablet [[id number]]   Rx #: [id number]  naproxen  (NAPROSYN ) 500 MG tablet [[id number]]    Has the patient contacted their pharmacy? Yes (Agent: If no, request that the patient contact the pharmacy for the refill. If patient does not wish to contact the pharmacy document the reason why and proceed with request.) (Agent: If yes, when and what did the pharmacy advise?)  Is this the correct pharmacy for this prescription? Yes If no, delete pharmacy and type the correct one.  This is the patient's preferred pharmacy:  [phone] - [place], [place] - [address] [place] [address] Phone: [phone] Fax: [phone]  Has the prescription been filled recently? Yes  Is the patient out of the medication? Yes  Has the patient been seen for an appointment in the last year OR does the patient have an upcoming appointment? Yes  Can we respond through MyChart? No  Agent: Please be advised that Rx refills may take up to 3 business days. We ask that you follow-up with your pharmacy.

## 2024-01-25 ENCOUNTER — Other Ambulatory Visit: Payer: Self-pay | Admitting: Nurse Practitioner

## 2024-01-25 ENCOUNTER — Other Ambulatory Visit: Payer: Self-pay

## 2024-01-25 DIAGNOSIS — I10 Essential (primary) hypertension: Secondary | ICD-10-CM

## 2024-01-25 MED ORDER — LISINOPRIL 10 MG PO TABS: 10.0000 mg | ORAL_TABLET | Freq: Every day | ORAL | 1 refills | Status: DC

## 2024-01-25 NOTE — Telephone Encounter (Signed)
 Pt requesting change of pharmacy.    Copied from CRM [id number]. Topic: Clinical - Medication Refill >> Jan 25, 2024  2:31 PM Tiffany S wrote: Most Recent Primary Care Visit:  Provider: Winda Hastings W  Department: [facility]  Visit Type: OFFICE VISIT  Date: 11/29/2023  Medication: lisinopril  (ZESTRIL ) 10 MG tablet  Has the patient contacted their pharmacy? Yes (Agent: If no, request that the patient contact the pharmacy for the refill. If patient does not wish to contact the pharmacy document the reason why and proceed with request.) (Agent: If yes, when and what did the pharmacy advise?)  Is this the correct pharmacy for this prescription? Yes If no, delete pharmacy and type the correct one.  This is the patient's preferred pharmacy:  [phone] - [place], [place] - [address] [place] [address] Phone: [phone] Fax: [phone]   Has the prescription been filled recently? Yes  Is the patient out of the medication? Yes  Has the patient been seen for an appointment in the last year OR does the patient have an upcoming appointment? Yes  Can we respond through MyChart? Yes  Agent: Please be advised that Rx refills may take up to 3 business days. We ask that you follow-up with your pharmacy.

## 2024-02-02 ENCOUNTER — Other Ambulatory Visit: Payer: Self-pay

## 2024-02-20 ENCOUNTER — Other Ambulatory Visit: Payer: Self-pay

## 2024-02-22 ENCOUNTER — Other Ambulatory Visit (HOSPITAL_BASED_OUTPATIENT_CLINIC_OR_DEPARTMENT_OTHER): Admitting: Pharmacist

## 2024-02-22 DIAGNOSIS — E119 Type 2 diabetes mellitus without complications: Secondary | ICD-10-CM

## 2024-02-22 NOTE — Progress Notes (Signed)
 Pharmacy Quality Measure Review  This patient is appearing on a report for being at risk of failing the adherence measure for diabetes medications this calendar year.   Medication: metformin  Last fill date: 01/25/2024 for 90 day supply  Medication: Trulicity  Last fill date: 02/20/2024 for 28 day supply  Insurance report was not up to date. No action needed at this time.   Marene Shape, PharmD, Becky Bowels, CPP Clinical Pharmacist [facility] [phone]

## 2024-03-21 ENCOUNTER — Other Ambulatory Visit (HOSPITAL_BASED_OUTPATIENT_CLINIC_OR_DEPARTMENT_OTHER): Admitting: Pharmacist

## 2024-03-21 DIAGNOSIS — E785 Hyperlipidemia, unspecified: Secondary | ICD-10-CM

## 2024-03-21 DIAGNOSIS — Z7984 Long term (current) use of oral hypoglycemic drugs: Secondary | ICD-10-CM

## 2024-03-21 DIAGNOSIS — Z7985 Long-term (current) use of injectable non-insulin antidiabetic drugs: Secondary | ICD-10-CM

## 2024-03-21 DIAGNOSIS — I10 Essential (primary) hypertension: Secondary | ICD-10-CM

## 2024-03-21 DIAGNOSIS — E119 Type 2 diabetes mellitus without complications: Secondary | ICD-10-CM

## 2024-03-21 NOTE — Progress Notes (Signed)
 Pharmacy Quality Measure Review  This patient is appearing on a report for the adherence measure for cholesterol (statin), diabetes, and hypertension (ACEi/ARB) medications this calendar year.   Medication: atorvastatin  Last fill date: 01/25/2024 for 90 day supply  Insurance report was not up to date. No action needed at this time.   Medication: Dulaglutide  Last fill date: 02/20/24 for 28 day supply  Contacted pharmacy to facilitate refills.  Medication: lisinopril  Last fill date: 01/25/2024 for 90 day supply  Insurance report was not up to date. No action needed at this time.   Medication: metformin  Last fill date: 01/25/2024 for 90 day supply  Insurance report was not up to date. No action needed at this time.

## 2024-03-27 ENCOUNTER — Telehealth: Payer: Self-pay | Admitting: Nurse Practitioner

## 2024-03-27 NOTE — Telephone Encounter (Signed)
Contacted pt confirmed appt

## 2024-03-28 ENCOUNTER — Encounter: Payer: Self-pay | Admitting: Nurse Practitioner

## 2024-03-28 ENCOUNTER — Ambulatory Visit: Payer: 59 | Attending: Nurse Practitioner | Admitting: Nurse Practitioner

## 2024-03-28 VITALS — BP 129/83 | HR 86 | Resp 19 | Ht 73.0 in | Wt 211.8 lb

## 2024-03-28 DIAGNOSIS — E1165 Type 2 diabetes mellitus with hyperglycemia: Secondary | ICD-10-CM

## 2024-03-28 DIAGNOSIS — E0849 Diabetes mellitus due to underlying condition with other diabetic neurological complication: Secondary | ICD-10-CM | POA: Insufficient documentation

## 2024-03-28 DIAGNOSIS — Z23 Encounter for immunization: Secondary | ICD-10-CM

## 2024-03-28 DIAGNOSIS — I10 Essential (primary) hypertension: Secondary | ICD-10-CM | POA: Diagnosis not present

## 2024-03-28 DIAGNOSIS — F17210 Nicotine dependence, cigarettes, uncomplicated: Secondary | ICD-10-CM | POA: Diagnosis not present

## 2024-03-28 DIAGNOSIS — E7800 Pure hypercholesterolemia, unspecified: Secondary | ICD-10-CM | POA: Diagnosis not present

## 2024-03-28 DIAGNOSIS — L989 Disorder of the skin and subcutaneous tissue, unspecified: Secondary | ICD-10-CM | POA: Diagnosis not present

## 2024-03-28 DIAGNOSIS — Z7985 Long-term (current) use of injectable non-insulin antidiabetic drugs: Secondary | ICD-10-CM

## 2024-03-28 DIAGNOSIS — F17200 Nicotine dependence, unspecified, uncomplicated: Secondary | ICD-10-CM

## 2024-03-28 LAB — POCT GLYCOSYLATED HEMOGLOBIN (HGB A1C): Hemoglobin A1C: 7.3 % — AB (ref 4.0–5.6)

## 2024-03-28 MED ORDER — FREESTYLE LIBRE 3 SENSOR MISC
0 refills | Status: AC
Start: 2024-03-28 — End: ?

## 2024-03-28 MED ORDER — FREESTYLE LIBRE 3 READER DEVI: 99 refills | Status: AC

## 2024-03-28 NOTE — Patient Instructions (Addendum)
 [facility] Address: [address]  [place], [place] [address] Phone: [phone]  [facility] [address] [place],  [place]  [address] Ph; [phone]

## 2024-03-28 NOTE — Progress Notes (Signed)
 Assessment & Plan:  Kyle Campos was seen today for diabetes.  Diagnoses and all orders for this visit:  Type 2 diabetes mellitus with hyperglycemia, without long-term current use of insulin  -     POCT glycosylated hemoglobin (Hb A1C) -     Basic metabolic panel with GFR -     Continuous Glucose Receiver (FREESTYLE LIBRE 3 READER) DEVI; Check blood sugars continuously E11.65 -     Continuous Glucose Sensor (FREESTYLE LIBRE 3 SENSOR) MISC; Check blood sugars continuously E11.65  Primary hypertension Continue lisinopril  as prescribed.  Reminded to bring in blood pressure log for follow  up appointment.  RECOMMENDATIONS: DASH/Mediterranean Diets are healthier choices for HTN.    Hypercholesterolemia -     Lipid panel  Skin lesion -     Ambulatory referral to Dermatology  Tobacco dependence with current use -     CT CHEST LUNG CA SCREEN LOW DOSE W/O CM; Future    Patient has been counseled on age-appropriate routine health concerns for screening and prevention. These are reviewed and up-to-date. Referrals have been placed accordingly. Immunizations are up-to-date or declined.    Subjective:   Chief Complaint  Patient presents with   Diabetes         Kyle Campos 68 y.o. male presents to office today for hypertension and diabetes.  He has a past medical history of Colon polyps (03/14/2015), DM2, Diverticulosis, Hepatic steatosis, Hyperlipidemia, Hypertension, Lung nodules, and Smoker.      DM 2 A1c has improved and currently 7.3 down from 8.1.  He is currently administering Trulicity  1.5 mg weekly.  Lab Results  Component Value Date   HGBA1C 7.3 (A) 03/28/2024     Lab Results  Component Value Date   HGBA1C 8.1 (A) 11/29/2023   Lab Results  Component Value Date   LDLCALC 60 11/29/2023     HTN Blood pressure is well-controlled with lisinopril  10 mg daily. BP Readings from Last 3 Encounters:  03/28/24 129/83  11/29/23 130/60  11/09/23 (!) 147/78       He  has a lesion on the lower left leg that we have attempted to treat with several strength steroid creams. Despite steroid cream management the lesion is still present. Associated symptoms: pruritis   Review of Systems  Constitutional:  Negative for fever, malaise/fatigue and weight loss.  HENT: Negative.  Negative for nosebleeds.   Eyes: Negative.  Negative for blurred vision, double vision and photophobia.  Respiratory: Negative.  Negative for cough and shortness of breath.   Cardiovascular: Negative.  Negative for chest pain, palpitations and leg swelling.  Gastrointestinal: Negative.  Negative for heartburn, nausea and vomiting.  Musculoskeletal: Negative.  Negative for myalgias.  Skin:  Positive for itching and rash.  Neurological: Negative.  Negative for dizziness, focal weakness, seizures and headaches.  Psychiatric/Behavioral: Negative.  Negative for suicidal ideas.     Past Medical History:  Diagnosis Date   Colon polyps 03/14/2015   Tubular adenoma x 6   Diabetes mellitus (HCC)    Diverticulosis    Hepatic steatosis    Hyperlipidemia    Hypertension    Lung nodules    Smoker     Past Surgical History:  Procedure Laterality Date   APPENDECTOMY  at age 59    colonoscopy  03/14/2015    Family History  Problem Relation Age of Onset   Diabetes Mother    Cancer Father        lung cancer    Stroke  Maternal Grandfather    Colon polyps Brother    Colon cancer Neg Hx    Esophageal cancer Neg Hx    Rectal cancer Neg Hx    Stomach cancer Neg Hx     Social History Reviewed with no changes to be made today.   Outpatient Medications Prior to Visit  Medication Sig Dispense Refill   albuterol  (PROVENTIL ) (2.5 MG/3ML) 0.083% nebulizer solution Take 3 mLs (2.5 mg total) by nebulization every 6 (six) hours as needed for wheezing or shortness of breath. 150 mL 1   albuterol  (VENTOLIN  HFA) 108 (90 Base) MCG/ACT inhaler Inhale 1-2 puffs into the lungs every 6 (six) hours as  needed for wheezing or shortness of breath. 6.7 g 2   atorvastatin  (LIPITOR) 40 MG tablet Take 1 tablet (40 mg total) by mouth daily. 90 tablet 1   Blood Glucose Monitoring Suppl (TRUE METRIX METER) w/Device KIT Monitor blood glucose levels twice per day 1 kit 0   budesonide -formoterol  (SYMBICORT ) 80-4.5 MCG/ACT inhaler Inhale 2 puffs into the lungs 2 (two) times daily. 1 each 3   Dulaglutide  (TRULICITY ) 1.5 MG/0.5ML SOAJ Inject 1.5 mg into the skin once a week. 2 mL 3   fluticasone  (FLONASE ) 50 MCG/ACT nasal spray Place 2 sprays into both nostrils daily. 16 g 0   glucose blood (TRUE METRIX BLOOD GLUCOSE TEST) test strip Monitor blood glucose levels twice per day 100 each 2   lisinopril  (ZESTRIL ) 10 MG tablet Take 1 tablet (10 mg total) by mouth daily. 90 tablet 1   metFORMIN  (GLUCOPHAGE ) 1000 MG tablet Take 1 tablet (1,000 mg total) by mouth 2 (two) times daily with a meal. 180 tablet 1   TRUEplus Lancets 28G MISC Monitor blood glucose levels twice per day 100 each 2   Dulaglutide  (TRULICITY ) 0.75 MG/0.5ML SOAJ Inject 0.75 mg into the skin once a week. 6 mL 1   naproxen  (NAPROSYN ) 500 MG tablet Take 1 tablet (500 mg total) by mouth 2 (two) times daily with a meal. 180 tablet 0   azithromycin  (ZITHROMAX  Z-PAK) 250 MG tablet Take two pills today followed by one a day until gone (Patient not taking: Reported on 03/28/2024) 6 tablet 0   betamethasone  dipropionate 0.05 % cream Apply topically 2 (two) times daily. (Patient not taking: Reported on 03/28/2024) 30 g 0   Continuous Glucose Receiver (FREESTYLE LIBRE 3 READER) DEVI Check blood sugars continuously E11.65 (Patient not taking: Reported on 03/28/2024) 2 each PRN   Continuous Glucose Sensor (FREESTYLE LIBRE 3 SENSOR) MISC Check blood sugars continuously E11.65 (Patient not taking: Reported on 03/28/2024) 1 each 0   cyclobenzaprine  (FLEXERIL ) 10 MG tablet Take 1 tablet (10 mg total) by mouth 3 (three) times daily as needed for muscle spasms. (Patient not  taking: Reported on 03/28/2024) 60 tablet 0   No facility-administered medications prior to visit.    No Known Allergies     Objective:    BP 129/83 (BP Location: Left Arm, Patient Position: Sitting, Cuff Size: Normal)   Pulse 86   Resp 19   Ht 6' 1 (1.854 m)   Wt 211 lb 12.8 oz (96.1 kg)   SpO2 99%   BMI 27.94 kg/m  Wt Readings from Last 3 Encounters:  03/28/24 211 lb 12.8 oz (96.1 kg)  11/29/23 209 lb 9.6 oz (95.1 kg)  07/08/23 210 lb 3.2 oz (95.3 kg)    Physical Exam Vitals and nursing note reviewed.  Constitutional:      Appearance: He is well-developed.  HENT:     Head: Normocephalic and atraumatic.   Cardiovascular:     Rate and Rhythm: Normal rate and regular rhythm.     Heart sounds: Normal heart sounds. No murmur heard.    No friction rub. No gallop.  Pulmonary:     Effort: Pulmonary effort is normal. No tachypnea or respiratory distress.     Breath sounds: Normal breath sounds. No decreased breath sounds, wheezing, rhonchi or rales.  Chest:     Chest wall: No tenderness.  Abdominal:     General: Bowel sounds are normal.     Palpations: Abdomen is soft.   Musculoskeletal:        General: Normal range of motion.     Cervical back: Normal range of motion.   Skin:    General: Skin is warm and dry.     Findings: Lesion and rash present. Rash is macular.       Neurological:     Mental Status: He is alert and oriented to person, place, and time.     Coordination: Coordination normal.   Psychiatric:        Behavior: Behavior normal. Behavior is cooperative.        Thought Content: Thought content normal.        Judgment: Judgment normal.          Patient has been counseled extensively about nutrition and exercise as well as the importance of adherence with medications and regular follow-up. The patient was given clear instructions to go to ER or return to medical center if symptoms don't improve, worsen or new problems develop. The patient  verbalized understanding.   Follow-up: Return in about 3 months (around 06/28/2024).   Haze LELON Servant, FNP-BC [facility] and [facility] [place], [place] [phone]   03/28/2024, 10:29 AM

## 2024-03-29 LAB — LIPID PANEL
Chol/HDL Ratio: 3.7 ratio (ref 0.0–5.0)
Cholesterol, Total: 129 mg/dL (ref 100–199)
HDL: 35 mg/dL — ABNORMAL LOW (ref >39)
LDL Chol Calc (NIH): 52 mg/dL (ref 0–99)
Triglycerides: 265 mg/dL — ABNORMAL HIGH (ref 0–149)
VLDL Cholesterol Cal: 42 mg/dL — ABNORMAL HIGH (ref 5–40)

## 2024-03-29 LAB — BASIC METABOLIC PANEL WITH GFR
BUN/Creatinine Ratio: 30 — ABNORMAL HIGH (ref 10–24)
BUN: 28 mg/dL — ABNORMAL HIGH (ref 8–27)
CO2: 21 mmol/L (ref 20–29)
Calcium: 9.8 mg/dL (ref 8.6–10.2)
Chloride: 98 mmol/L (ref 96–106)
Creatinine, Ser: 0.92 mg/dL (ref 0.76–1.27)
Glucose: 234 mg/dL — ABNORMAL HIGH (ref 70–99)
Potassium: 5.1 mmol/L (ref 3.5–5.2)
Sodium: 138 mmol/L (ref 134–144)
eGFR: 92 mL/min/{1.73_m2} (ref >59)

## 2024-03-31 ENCOUNTER — Other Ambulatory Visit: Payer: Self-pay | Admitting: Nurse Practitioner

## 2024-03-31 ENCOUNTER — Ambulatory Visit: Payer: Self-pay | Admitting: Nurse Practitioner

## 2024-03-31 DIAGNOSIS — E781 Pure hyperglyceridemia: Secondary | ICD-10-CM

## 2024-03-31 MED ORDER — ROSUVASTATIN CALCIUM 20 MG PO TABS
20.0000 mg | ORAL_TABLET | Freq: Every day | ORAL | 3 refills | Status: DC
Start: 2024-03-31 — End: 2024-08-08

## 2024-04-04 ENCOUNTER — Ambulatory Visit

## 2024-04-04 DIAGNOSIS — Z122 Encounter for screening for malignant neoplasm of respiratory organs: Secondary | ICD-10-CM | POA: Diagnosis not present

## 2024-04-04 DIAGNOSIS — F17200 Nicotine dependence, unspecified, uncomplicated: Secondary | ICD-10-CM

## 2024-04-04 DIAGNOSIS — F17210 Nicotine dependence, cigarettes, uncomplicated: Secondary | ICD-10-CM

## 2024-04-21 ENCOUNTER — Other Ambulatory Visit: Payer: Self-pay | Admitting: Family Medicine

## 2024-04-21 DIAGNOSIS — M542 Cervicalgia: Secondary | ICD-10-CM

## 2024-04-22 ENCOUNTER — Other Ambulatory Visit: Payer: Self-pay | Admitting: Nurse Practitioner

## 2024-04-22 DIAGNOSIS — E1165 Type 2 diabetes mellitus with hyperglycemia: Secondary | ICD-10-CM

## 2024-05-09 ENCOUNTER — Other Ambulatory Visit (HOSPITAL_BASED_OUTPATIENT_CLINIC_OR_DEPARTMENT_OTHER): Payer: Self-pay | Admitting: Pharmacist

## 2024-05-09 DIAGNOSIS — Z7984 Long term (current) use of oral hypoglycemic drugs: Secondary | ICD-10-CM

## 2024-05-09 DIAGNOSIS — Z7985 Long-term (current) use of injectable non-insulin antidiabetic drugs: Secondary | ICD-10-CM

## 2024-05-09 DIAGNOSIS — E7800 Pure hypercholesterolemia, unspecified: Secondary | ICD-10-CM

## 2024-05-09 DIAGNOSIS — E119 Type 2 diabetes mellitus without complications: Secondary | ICD-10-CM

## 2024-05-09 DIAGNOSIS — I10 Essential (primary) hypertension: Secondary | ICD-10-CM

## 2024-05-09 NOTE — Progress Notes (Signed)
 Pharmacy Quality Measure Review  This patient is appearing on a report for the adherence measure for cholesterol (statin), diabetes, and hypertension (ACEi/ARB) medications this calendar year.   Medication: atorvastatin  Last fill date: 04/23/2024 for 90 day supply  Insurance report was not up to date. No action needed at this time.   Medication: Dulaglutide  Last fill date: 04/30/2024 for 28 day supply  Insurance report was not up to date. No action needed at this time.   Medication: lisinopril  Last fill date: 04/23/2024 for 90 day supply  Insurance report was not up to date. No action needed at this time.   Medication: metformin  Last fill date: 04/23/2024 for 90 day supply  Insurance report was not up to date. No action needed at this time.   Herlene Fleeta Morris, PharmD, JAQUELINE, CPP Clinical Pharmacist [facility] & [facility] [phone]

## 2024-05-30 ENCOUNTER — Other Ambulatory Visit (HOSPITAL_BASED_OUTPATIENT_CLINIC_OR_DEPARTMENT_OTHER): Payer: Self-pay | Admitting: Pharmacist

## 2024-05-30 DIAGNOSIS — Z79899 Other long term (current) drug therapy: Secondary | ICD-10-CM

## 2024-05-30 NOTE — Progress Notes (Signed)
 Pharmacy Quality Measure Review  This patient is appearing on a report for the adherence measure for cholesterol (statin), diabetes, and hypertension (ACEi/ARB) medications this calendar year.   Medication: atorvastatin  Last fill date: 04/23/2024 for 90 day supply  Insurance report was not up to date. No action needed at this time.   Medication: Dulaglutide  Last fill date: 04/30/2024 for 28 day supply. Called his pharmacy and authorized refill on his behalf.   Medication: lisinopril  Last fill date: 04/23/2024 for 90 day supply  Insurance report was not up to date. No action needed at this time.   Medication: metformin  Last fill date: 04/23/2024 for 90 day supply  Insurance report was not up to date. No action needed at this time.   Herlene Fleeta Morris, PharmD, JAQUELINE, CPP Clinical Pharmacist [facility] & [facility] [phone]

## 2024-06-20 ENCOUNTER — Ambulatory Visit
Admission: EM | Admit: 2024-06-20 | Discharge: 2024-06-20 | Disposition: A | Discharge status: Home / Self Care | Attending: Family Medicine | Admitting: Family Medicine

## 2024-06-20 ENCOUNTER — Other Ambulatory Visit: Payer: Self-pay

## 2024-06-20 DIAGNOSIS — J209 Acute bronchitis, unspecified: Secondary | ICD-10-CM

## 2024-06-20 DIAGNOSIS — F17200 Nicotine dependence, unspecified, uncomplicated: Secondary | ICD-10-CM

## 2024-06-20 MED ORDER — AZITHROMYCIN 250 MG PO TABS
250.0000 mg | ORAL_TABLET | Freq: Every day | ORAL | 0 refills | Status: DC
Start: 2024-06-20 — End: 2024-08-27

## 2024-06-20 MED ORDER — PREDNISONE 20 MG PO TABS: 40.0000 mg | ORAL_TABLET | Freq: Every day | ORAL | 0 refills | Status: DC

## 2024-06-20 NOTE — ED Provider Notes (Signed)
 Kyle Campos    CSN: [id number] Arrival date & time: 06/20/24  0950      History   Chief Complaint Chief Complaint  Patient presents with   Cough   Nasal Congestion    HPI Kyle Campos is a 68 y.o. male.   Patient is a smoker.  Known COPD.  Prone to bronchitis.  Also has hypertension, hyperlipidemia, and diabetes all well-controlled.  Is here with a cough and congestion.  Is been present for 4 to 5 days.  Chest feels tight.  He states that he has inhalers but has not been using them.  No fever or chills.  No headache or body ache.  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 Authorizing Provider  azithromycin  (ZITHROMAX ) 250 MG tablet Take 1 tablet (250 mg total) by mouth daily. Take first 2 tablets together, then 1 every day until finished. 06/20/24  Yes Maranda Jamee Jacob, MD  predniSONE  (DELTASONE ) 20 MG tablet Take 2 tablets (40 mg total) by mouth daily with breakfast. 06/20/24  Yes Maranda Jamee Jacob, MD  albuterol  (PROVENTIL ) (2.5 MG/3ML) 0.083% nebulizer solution Take 3 mLs (2.5 mg total) by nebulization every 6 (six) hours as needed for wheezing or shortness of breath. 11/29/23   Theotis Haze ORN, NP  albuterol  (VENTOLIN  HFA) 108 (90 Base) MCG/ACT inhaler Inhale 1-2 puffs into the lungs every 6 (six) hours as needed for wheezing or shortness of breath. 07/08/23   Fleming, Zelda W, NP  Blood Glucose Monitoring Suppl (TRUE METRIX METER) w/Device KIT Monitor blood glucose levels twice per day 01/20/21   Newlin, Enobong, MD  budesonide -formoterol  (SYMBICORT ) 80-4.5 MCG/ACT inhaler Inhale 2 puffs into the lungs 2 (two) times daily. 11/29/23   Fleming, Zelda W, NP  Continuous Glucose Receiver (FREESTYLE LIBRE 3 READER) DEVI Check blood sugars continuously E11.65 03/28/24   Theotis Haze ORN, NP  Continuous Glucose Sensor (FREESTYLE LIBRE 3 SENSOR) MISC Check blood sugars continuously E11.65 03/28/24   Theotis Haze ORN, NP  Dulaglutide  (TRULICITY ) 1.5 MG/0.5ML SOAJ INJECT 1.5 MG SUBCUTANEOUSLY ONCE A WEEK 04/24/24   Fleming, Zelda W, NP  fluticasone  (FLONASE ) 50 MCG/ACT nasal spray Place 2 sprays into both nostrils daily. 11/09/23   Maranda Jamee Jacob, MD  glucose blood (TRUE METRIX BLOOD GLUCOSE TEST) test strip Monitor blood glucose levels twice per day 01/20/21   Newlin, Enobong, MD  lisinopril  (ZESTRIL )  10 MG tablet Take 1 tablet (10 mg total) by mouth daily. 01/25/24   Fleming, Zelda W, NP  metFORMIN  (GLUCOPHAGE ) 1000 MG tablet Take 1 tablet (1,000 mg total) by mouth 2 (two) times daily with a meal. 01/24/24   Delbert Clam, MD  naproxen  (NAPROSYN ) 500 MG tablet TAKE 1 TABLET BY MOUTH 2 TIMES DAILY WITH A MEAL. 04/23/24   Fleming, Zelda W, NP  rosuvastatin  (CRESTOR ) 20 MG tablet Take 1 tablet (20 mg total) by mouth daily. 03/31/24   Theotis Haze ORN, NP  TRUEplus Lancets 28G MISC Monitor blood glucose levels twice per day 01/20/21   Delbert Clam, MD    Family History Family History  Problem  Relation Age of Onset   Diabetes Mother    Cancer Father        lung cancer    Stroke Maternal Grandfather    Colon polyps Brother    Colon cancer Neg Hx    Esophageal cancer Neg Hx    Rectal cancer Neg Hx    Stomach cancer Neg Hx     Social History Social History   Tobacco Use   Smoking status: Every Day    Current packs/day: 1.00    Average packs/day: 1 pack/day for 40.0 years (40.0 ttl pk-yrs)    Types: Cigarettes   Smokeless tobacco: Never  Vaping Use   Vaping status: Never Used  Substance Use Topics   Alcohol use: Not Currently    Alcohol/week: 0.0 standard drinks of alcohol    Comment: once monthly   Drug use: No     Allergies   Patient has no known allergies.   Review of Systems Review of Systems See HPI  Physical Exam Triage Vital Signs ED Triage Vitals  Encounter Vitals Group     BP 06/20/24 1021 134/77     Girls Systolic BP Percentile --      Girls Diastolic BP Percentile --      Boys Systolic BP Percentile --      Boys Diastolic BP Percentile --      Pulse Rate 06/20/24 1021 80     Resp 06/20/24 1021 20     Temp 06/20/24 1021 97.8 F (36.6 C)     Temp src --      SpO2 06/20/24 1021 94 %     Weight --      Height --      Head Circumference --      Peak Flow --      Pain Score 06/20/24 1025 2     Pain Loc --      Pain Education --      Exclude from Growth Chart --    No data found.  Updated Vital Signs BP 134/77   Pulse 80   Temp 97.8 F (36.6 C)   Resp 20   SpO2 94%      Physical Exam Constitutional:      General: He is not in acute distress.    Appearance: He is well-developed and normal weight. He is ill-appearing.  HENT:     Head: Normocephalic and atraumatic.     Right Ear: Tympanic membrane normal.     Left Ear: Tympanic membrane normal.     Nose: Nose normal. No congestion.     Mouth/Throat:     Mouth: Mucous membranes are moist.     Pharynx: No posterior oropharyngeal erythema.  Eyes:     Conjunctiva/sclera:  Conjunctivae normal.     Pupils: Pupils  are equal, round, and reactive to light.  Cardiovascular:     Rate and Rhythm: Normal rate.     Heart sounds: Normal heart sounds.  Pulmonary:     Effort: Pulmonary effort is normal. No respiratory distress.     Breath sounds: Wheezing and rhonchi present.  Abdominal:     General: There is no distension.     Palpations: Abdomen is soft.  Musculoskeletal:        General: Normal range of motion.     Cervical back: Normal range of motion.  Skin:    General: Skin is warm and dry.  Neurological:     Mental Status: He is alert.      UC Treatments / Results  Labs (all labs ordered are listed, but only abnormal results are displayed) Labs Reviewed - No data to display  EKG   Radiology No results found.  Procedures Procedures (including critical Campos time)  Medications Ordered in UC Medications - No data to display  Initial Impression / Assessment and Plan / UC Course  I have reviewed the triage vital signs and the nursing notes.  Pertinent labs & imaging results that were available during my Campos of the patient were reviewed by me and considered in my medical decision making (see chart for details).     Patient has significant wheeze and rhonchi on exam.  Because of his underlying COPD and cigarette smoking I feel covering with an antibiotic is necessary Final Clinical Impressions(s) / UC Diagnoses   Final diagnoses:  Acute bronchitis, unspecified organism  Tobacco dependence     Discharge Instructions      Make sure you are drinking lots of water Take prednisone  once a day for 5 days Take the azithromycin  antibiotic as directed May take over-the-counter cough medicine as needed    ED Prescriptions     Medication Sig Dispense Auth. Provider   predniSONE  (DELTASONE ) 20 MG tablet Take 2 tablets (40 mg total) by mouth daily with breakfast. 10 tablet Maranda Jamee Jacob, MD   azithromycin  (ZITHROMAX ) 250 MG tablet Take 1  tablet (250 mg total) by mouth daily. Take first 2 tablets together, then 1 every day until finished. 6 tablet Maranda Jamee Jacob, MD      PDMP not reviewed this encounter.   Maranda Jamee Jacob, MD 06/20/24 [phone]

## 2024-06-20 NOTE — ED Triage Notes (Addendum)
 Has been sick x 2 days, has c/o nasal congestion, sore throat, cough, chest congestion. No fever. Has had robitussin daytime. Here with another patient who tested negative for covid yesterday at her physician.

## 2024-06-20 NOTE — Discharge Instructions (Signed)
 Make sure you are drinking lots of water Take prednisone  once a day for 5 days Take the azithromycin  antibiotic as directed May take over-the-counter cough medicine as needed

## 2024-06-21 ENCOUNTER — Telehealth: Payer: Self-pay

## 2024-06-21 NOTE — Telephone Encounter (Signed)
Called to check on patient. No answer. Left voicemail.

## 2024-06-27 ENCOUNTER — Telehealth: Payer: Self-pay | Admitting: Nurse Practitioner

## 2024-06-27 ENCOUNTER — Encounter: Payer: Self-pay | Admitting: Internal Medicine

## 2024-06-27 ENCOUNTER — Other Ambulatory Visit (HOSPITAL_BASED_OUTPATIENT_CLINIC_OR_DEPARTMENT_OTHER): Payer: Self-pay | Admitting: Pharmacist

## 2024-06-27 DIAGNOSIS — E1165 Type 2 diabetes mellitus with hyperglycemia: Secondary | ICD-10-CM

## 2024-06-27 NOTE — Telephone Encounter (Signed)
 Pt unconfirmed appt 9/26 (per vr ) lvm

## 2024-06-27 NOTE — Progress Notes (Signed)
 Pharmacy Quality Measure Review  This patient is appearing on a report for the adherence measure for cholesterol (statin), diabetes, and hypertension (ACEi/ARB) medications this calendar year.   Medication: atorvastatin  Last fill date: 04/23/2024 for 90 day supply  Medication: Dulaglutide  Last fill date: 06/04/2024 for 28 day supply.  Medication: lisinopril  Last fill date: 04/23/2024 for 90 day supply  Medication: metformin  Last fill date: 04/23/2024 for 90 day supply  Herlene Fleeta Morris, PharmD, [place], CPP Clinical Pharmacist [facility] & [facility] [phone]

## 2024-06-29 ENCOUNTER — Ambulatory Visit: Admitting: Nurse Practitioner

## 2024-07-04 ENCOUNTER — Other Ambulatory Visit: Payer: Self-pay | Admitting: Pharmacist

## 2024-07-04 NOTE — Progress Notes (Addendum)
 Pharmacy Quality Measure Review  This patient is appearing on a report for the adherence measure for cholesterol (statin), diabetes, and hypertension (ACEi/ARB) medications this calendar year.   Medication: rosuvastatin  Last fill date: 04/05/2024 for 90 day supply. Of note, he picked up atorvastatin  for duplicative therapy on 04/23/2024.   Medication: Dulaglutide  Last fill date: 07/09/2024 for 28 day supply.  Medication: lisinopril  Last fill date: 04/23/2024 for 90 day supply  Medication: metformin  Last fill date: 04/23/2024 for 90 day supply  Call placed to patient. His pharmacy has lisinopril  and metformin  ready for pick-up. Patient thanked me for the call and will pick these up some time this week.   Herlene Fleeta Morris, PharmD, JAQUELINE, CPP Clinical Pharmacist [facility] & [facility] [phone]

## 2024-07-13 ENCOUNTER — Other Ambulatory Visit: Payer: Self-pay | Admitting: Family Medicine

## 2024-07-13 DIAGNOSIS — E785 Hyperlipidemia, unspecified: Secondary | ICD-10-CM

## 2024-07-14 ENCOUNTER — Other Ambulatory Visit: Payer: Self-pay | Admitting: Family Medicine

## 2024-07-14 DIAGNOSIS — E1165 Type 2 diabetes mellitus with hyperglycemia: Secondary | ICD-10-CM

## 2024-07-17 ENCOUNTER — Ambulatory Visit: Admitting: Nurse Practitioner

## 2024-07-18 ENCOUNTER — Other Ambulatory Visit: Payer: Self-pay | Admitting: Nurse Practitioner

## 2024-07-18 DIAGNOSIS — I10 Essential (primary) hypertension: Secondary | ICD-10-CM

## 2024-07-19 ENCOUNTER — Other Ambulatory Visit: Payer: Self-pay | Admitting: Nurse Practitioner

## 2024-07-19 DIAGNOSIS — M542 Cervicalgia: Secondary | ICD-10-CM

## 2024-08-01 ENCOUNTER — Other Ambulatory Visit: Payer: Self-pay | Admitting: Pharmacist

## 2024-08-01 NOTE — Progress Notes (Signed)
 Pharmacy Quality Measure Review  This patient is appearing on a report for the adherence measure for cholesterol (statin), diabetes, and hypertension (ACEi/ARB) medications this calendar year.   Medication: rosuvastatin  Last fill date: 04/05/2024 for 90 day supply. Of note, he picked up atorvastatin  for duplicative therapy on 04/23/2024.   Medication: Dulaglutide  Last fill date: 07/09/2024 for 28 day supply.  Medication: lisinopril  Last fill date: 04/23/2024 for 90 day supply  Medication: metformin  Last fill date: 04/23/2024 for 90 day supply  I have collaborated with his pharmacy this month and have called him several times and encouraged pick-up. He has yet to pick these up despite my efforts.   Herlene Fleeta Morris, PharmD, JAQUELINE, CPP Clinical Pharmacist [facility] & [facility] [phone]

## 2024-08-08 ENCOUNTER — Other Ambulatory Visit: Payer: Self-pay | Admitting: Pharmacist

## 2024-08-08 ENCOUNTER — Other Ambulatory Visit: Payer: Self-pay | Admitting: Family Medicine

## 2024-08-08 DIAGNOSIS — E781 Pure hyperglyceridemia: Secondary | ICD-10-CM

## 2024-08-08 DIAGNOSIS — E785 Hyperlipidemia, unspecified: Secondary | ICD-10-CM

## 2024-08-08 MED ORDER — ROSUVASTATIN CALCIUM 20 MG PO TABS: 20.0000 mg | ORAL_TABLET | Freq: Every day | ORAL | 3 refills | Status: AC

## 2024-08-08 NOTE — Progress Notes (Signed)
 Pharmacy Quality Measure Review  This patient is appearing on a report for the adherence measure for cholesterol (statin), diabetes, and hypertension (ACEi/ARB) medications this calendar year.   Medication: rosuvastatin  Last fill date: 04/05/2024 for 90 day supply. Of note, he picked up atorvastatin  for duplicative therapy on 04/23/2024.   Medication: Dulaglutide  Last fill date: 07/09/2024 for 28 day supply.  Medication: lisinopril  Last fill date: 04/23/2024 for 90 day supply  Medication: metformin  Last fill date: 04/23/2024 for 90 day supply  I have collaborated with his pharmacy this month and have called him several times and encouraged pick-up. He has yet to pick these up despite my efforts.   Herlene Fleeta Morris, PharmD, JAQUELINE, CPP Clinical Pharmacist [facility] & [facility] [phone]

## 2024-08-14 ENCOUNTER — Other Ambulatory Visit: Payer: Self-pay | Admitting: Pharmacist

## 2024-08-14 NOTE — Progress Notes (Signed)
 Pharmacy Quality Measure Review  This patient is appearing on a report for the adherence measure for cholesterol (statin), diabetes, and hypertension (ACEi/ARB) medications this calendar year.   Medication: rosuvastatin  Last fill date: 08/12/2024 for 90 day supply.   Medication: Dulaglutide  Last fill date: 07/09/2024 for 28 day supply.  Medication: lisinopril  Last fill date: 08/12/2024 for 90 day supply  Medication: metformin  Last fill date: 04/23/2024 for 90 day supply  I have collaborated with his pharmacy since August. Luckily, he recently filled his lisinopril  and rosuvastatin  so should pass MAH/MAC measures. Continue to not pick his DM medications up despite my efforts.   Herlene Fleeta Morris, PharmD, JAQUELINE, CPP Clinical Pharmacist [facility] & [facility] [phone]

## 2024-08-27 ENCOUNTER — Encounter: Payer: Self-pay | Admitting: Nurse Practitioner

## 2024-08-27 ENCOUNTER — Ambulatory Visit: Attending: Nurse Practitioner | Admitting: Nurse Practitioner

## 2024-08-27 VITALS — BP 120/79 | HR 88 | Resp 19 | Ht 73.0 in | Wt 212.4 lb

## 2024-08-27 DIAGNOSIS — M542 Cervicalgia: Secondary | ICD-10-CM

## 2024-08-27 DIAGNOSIS — J440 Chronic obstructive pulmonary disease with acute lower respiratory infection: Secondary | ICD-10-CM | POA: Diagnosis not present

## 2024-08-27 DIAGNOSIS — E1165 Type 2 diabetes mellitus with hyperglycemia: Secondary | ICD-10-CM

## 2024-08-27 DIAGNOSIS — H903 Sensorineural hearing loss, bilateral: Secondary | ICD-10-CM

## 2024-08-27 DIAGNOSIS — E781 Pure hyperglyceridemia: Secondary | ICD-10-CM

## 2024-08-27 DIAGNOSIS — Z7984 Long term (current) use of oral hypoglycemic drugs: Secondary | ICD-10-CM

## 2024-08-27 DIAGNOSIS — F17200 Nicotine dependence, unspecified, uncomplicated: Secondary | ICD-10-CM

## 2024-08-27 DIAGNOSIS — I10 Essential (primary) hypertension: Secondary | ICD-10-CM

## 2024-08-27 LAB — POCT GLYCOSYLATED HEMOGLOBIN (HGB A1C): Hemoglobin A1C: 8.8 % — AB (ref 4.0–5.6)

## 2024-08-27 MED ORDER — EMPAGLIFLOZIN 10 MG PO TABS: 10.0000 mg | ORAL_TABLET | Freq: Every day | ORAL | 3 refills | Status: AC

## 2024-08-27 MED ORDER — METFORMIN HCL 1000 MG PO TABS: 1000.0000 mg | ORAL_TABLET | Freq: Two times a day (BID) | ORAL | 1 refills | Status: AC

## 2024-08-27 MED ORDER — BUDESONIDE-FORMOTEROL FUMARATE 80-4.5 MCG/ACT IN AERO: 2.0000 | INHALATION_SPRAY | Freq: Two times a day (BID) | RESPIRATORY_TRACT | 3 refills | Status: AC

## 2024-08-27 MED ORDER — NAPROXEN 500 MG PO TABS: 500.0000 mg | ORAL_TABLET | Freq: Two times a day (BID) | ORAL | 0 refills | Status: AC

## 2024-08-27 NOTE — Progress Notes (Signed)
 Loss of hearing.

## 2024-08-27 NOTE — Progress Notes (Signed)
 "  Assessment & Plan:  Kyle Campos was seen today for diabetes.  Diagnoses and all orders for this visit:  Primary hypertension -     CMP14+EGFR; Future Continue all antihypertensives as prescribed.  Reminded to bring in blood pressure log for follow  up appointment.  RECOMMENDATIONS: DASH/Mediterranean Diets are healthier choices for HTN.    Type 2 diabetes mellitus with hyperglycemia, without long-term current use of insulin (HCC) -     POCT glycosylated hemoglobin (Hb A1C) -     metFORMIN  (GLUCOPHAGE ) 1000 MG tablet; Take 1 tablet (1,000 mg total) by mouth 2 (two) times daily with a meal. -     empagliflozin  (JARDIANCE ) 10 MG TABS tablet; Take 1 tablet (10 mg total) by mouth daily. -     CMP14+EGFR; Future -     Thyroid Panel With TSH; Future A1c elevated at 8.8. Current regimen includes Trulicity  and metformin . Added Jardiance  for glycemic control and cardiovascular protection. Discussed Jardiance  side effects and benefits. - Continue Trulicity  1.5 mg weekly. - Continue metformin  1000 mg twice daily. - Initiated Jardiance  10 mg daily. - Monitor blood glucose levels regularly. - Scheduled follow-up in 4-6 weeks to reassess A1c and adjust treatment.   Cervical pain (neck) -     naproxen  (NAPROSYN ) 500 MG tablet; Take 1 tablet (500 mg total) by mouth 2 (two) times daily with a meal.  Tobacco dependence with current use -     budesonide -formoterol  (SYMBICORT ) 80-4.5 MCG/ACT inhaler; Inhale 2 puffs into the lungs 2 (two) times daily. -     For home use only DME Nebulizer machine  Chronic obstructive pulmonary disease with acute lower respiratory infection (HCC) -     budesonide -formoterol  (SYMBICORT ) 80-4.5 MCG/ACT inhaler; Inhale 2 puffs into the lungs 2 (two) times daily. Wheezing indicates suboptimal control. Nebulizer machine not received. Current inhaler regimen includes Symbicort  and Proventil . Discussed nebulizer benefits. - Check status of nebulizer machine order and follow up  with supplier if not received by next Tuesday. - Continue Symbicort  2 puffs in the morning and 2 puffs in the evening. - Use Proventil  as a rescue inhaler as needed.   Sensorineural hearing loss (SNHL) of both ears -     Ambulatory referral to Audiology Difficulty hearing, especially in noisy environments. Possible sensorineural hearing loss due to noise exposure. - Referred to audiology for hearing test.   Patient has been counseled on age-appropriate routine health concerns for screening and prevention. These are reviewed and up-to-date. Referrals have been placed accordingly. Immunizations are up-to-date or declined.    Subjective:   Chief Complaint  Patient presents with   Diabetes    Kyle Campos 68 y.o. male presents to office today for diabetes management.   He has a past medical history of Colon polyps (03/14/2015), DM2, Diverticulosis, Hepatic steatosis, Hyperlipidemia, Hypertension, Lung nodules, and Smoker.    DM 2 His A1c is currently 8.8, which is higher than desired and increased since last visit. He ran out of his medication a few weeks ago. He is currently on Trulicity  1.5 mg weekly and metformin  1000 mg twice daily.  Lab Results  Component Value Date   HGBA1C 8.8 (A) 08/27/2024      HTN Blood pressure is at goal today. He is taking lisinopril  10 mg daily as prescribed BP Readings from Last 3 Encounters:  08/27/24 120/79  06/20/24 134/77  03/28/24 129/83    He experiences occasional difficulty hearing and sometimes needs to look at people's mouths to understand him. He  has a history of exposure to loud noises from hunting without using hearing protection.  He experiences occasional wheezing and has not received a nebulizer machine that was ordered in February. He is not using his maintenance inhaler regularly and has a rescue inhaler at home as well.  He uses naproxen  for his cervical neck pain which provides relief  Review of Systems  Constitutional:   Negative for fever, malaise/fatigue and weight loss.  HENT:  Positive for hearing loss. Negative for nosebleeds.   Eyes: Negative.  Negative for blurred vision, double vision and photophobia.  Respiratory:  Positive for wheezing. Negative for cough and shortness of breath.   Cardiovascular: Negative.  Negative for chest pain, palpitations and leg swelling.  Gastrointestinal: Negative.  Negative for heartburn, nausea and vomiting.  Musculoskeletal:  Positive for neck pain. Negative for myalgias.  Neurological: Negative.  Negative for dizziness, focal weakness, seizures and headaches.  Psychiatric/Behavioral: Negative.  Negative for suicidal ideas.     Past Medical History:  Diagnosis Date   Colon polyps 03/14/2015   Tubular adenoma x 6   Diabetes mellitus (HCC)    Diverticulosis    Hepatic steatosis    Hyperlipidemia    Hypertension    Lung nodules    Smoker     Past Surgical History:  Procedure Laterality Date   APPENDECTOMY  at age 3    colonoscopy  03/14/2015    Family History  Problem Relation Age of Onset   Diabetes Mother    Cancer Father        lung cancer    Stroke Maternal Grandfather    Colon polyps Brother    Colon cancer Neg Hx    Esophageal cancer Neg Hx    Rectal cancer Neg Hx    Stomach cancer Neg Hx     Social History Reviewed with no changes to be made today.   Outpatient Medications Prior to Visit  Medication Sig Dispense Refill   albuterol  (PROVENTIL ) (2.5 MG/3ML) 0.083% nebulizer solution Take 3 mLs (2.5 mg total) by nebulization every 6 (six) hours as needed for wheezing or shortness of breath. 150 mL 1   albuterol  (VENTOLIN  HFA) 108 (90 Base) MCG/ACT inhaler Inhale 1-2 puffs into the lungs every 6 (six) hours as needed for wheezing or shortness of breath. 6.7 g 2   Blood Glucose Monitoring Suppl (TRUE METRIX METER) w/Device KIT Monitor blood glucose levels twice per day 1 kit 0   fluticasone  (FLONASE ) 50 MCG/ACT nasal spray Place 2 sprays into  both nostrils daily. 16 g 0   glucose blood (TRUE METRIX BLOOD GLUCOSE TEST) test strip Monitor blood glucose levels twice per day 100 each 2   lisinopril  (ZESTRIL ) 10 MG tablet TAKE 1 TABLET BY MOUTH EVERY DAY 90 tablet 0   rosuvastatin  (CRESTOR ) 20 MG tablet Take 1 tablet (20 mg total) by mouth daily. 90 tablet 3   TRUEplus Lancets 28G MISC Monitor blood glucose levels twice per day 100 each 2   budesonide -formoterol  (SYMBICORT ) 80-4.5 MCG/ACT inhaler Inhale 2 puffs into the lungs 2 (two) times daily. 1 each 3   Dulaglutide  (TRULICITY ) 1.5 MG/0.5ML SOAJ INJECT 1.5 MG SUBCUTANEOUSLY ONCE A WEEK 2 mL 3   metFORMIN  (GLUCOPHAGE ) 1000 MG tablet TAKE 1 TABLET (1,000 MG TOTAL) BY MOUTH TWICE A DAY WITH FOOD 180 tablet 0   naproxen  (NAPROSYN ) 500 MG tablet TAKE 1 TABLET BY MOUTH TWICE A DAY WITH FOOD 180 tablet 0   predniSONE  (DELTASONE ) 20 MG tablet Take 2  tablets (40 mg total) by mouth daily with breakfast. 10 tablet 0   Continuous Glucose Receiver (FREESTYLE LIBRE 3 READER) DEVI Check blood sugars continuously E11.65 (Patient not taking: Reported on 08/27/2024) 2 each PRN   Continuous Glucose Sensor (FREESTYLE LIBRE 3 SENSOR) MISC Check blood sugars continuously E11.65 (Patient not taking: Reported on 08/27/2024) 1 each 0   azithromycin  (ZITHROMAX ) 250 MG tablet Take 1 tablet (250 mg total) by mouth daily. Take first 2 tablets together, then 1 every day until finished. (Patient not taking: Reported on 08/27/2024) 6 tablet 0   No facility-administered medications prior to visit.    No Known Allergies     Objective:    BP 120/79 (BP Location: Left Arm, Patient Position: Sitting, Cuff Size: Normal)   Pulse 88   Resp 19   Ht 6' 1 (1.854 m)   Wt 212 lb 6.4 oz (96.3 kg)   SpO2 98%   BMI 28.02 kg/m  Wt Readings from Last 3 Encounters:  08/27/24 212 lb 6.4 oz (96.3 kg)  03/28/24 211 lb 12.8 oz (96.1 kg)  11/29/23 209 lb 9.6 oz (95.1 kg)    Physical Exam Vitals and nursing note reviewed.   Constitutional:      Appearance: He is well-developed.  HENT:     Head: Normocephalic and atraumatic.     Right Ear: Decreased hearing noted. There is no impacted cerumen.     Left Ear: Decreased hearing noted. There is no impacted cerumen.  Cardiovascular:     Rate and Rhythm: Normal rate and regular rhythm.     Heart sounds: Normal heart sounds. No murmur heard.    No friction rub. No gallop.  Pulmonary:     Effort: Pulmonary effort is normal. No tachypnea or respiratory distress.     Breath sounds: Normal breath sounds. No decreased breath sounds, wheezing, rhonchi or rales.  Chest:     Chest wall: No tenderness.  Abdominal:     General: Bowel sounds are normal.     Palpations: Abdomen is soft.  Musculoskeletal:        General: Normal range of motion.     Cervical back: Normal range of motion.  Skin:    General: Skin is warm and dry.  Neurological:     Mental Status: He is alert and oriented to person, place, and time.     Coordination: Coordination normal.  Psychiatric:        Behavior: Behavior normal. Behavior is cooperative.        Thought Content: Thought content normal.        Judgment: Judgment normal.          Patient has been counseled extensively about nutrition and exercise as well as the importance of adherence with medications and regular follow-up. The patient was given clear instructions to go to ER or return to medical center if symptoms don't improve, worsen or new problems develop. The patient verbalized understanding.   Follow-up: Return in about 14 weeks (around 12/03/2024).   Kyle LELON Servant, FNP-BC [facility] [place], [place] [phone]   09/25/2024, 3:18 PM "

## 2024-09-06 ENCOUNTER — Telehealth: Payer: Self-pay | Admitting: Nurse Practitioner

## 2024-09-06 NOTE — Telephone Encounter (Signed)
Order resent and confirmed.

## 2024-09-06 NOTE — Telephone Encounter (Signed)
 Copied from CRM #[id number]. Topic: General - Other >> Sep 06, 2024 10:30 AM Amber H wrote: Reason for CRM: Jerel from [facility] called and stated a nebulizer prescription was sent over for the patient however, his insurance info was not running in the system and he was not appearing in her Epic system. I advised to Jerel that patients birthday was Mar 19, 1956 and not Feb 11, 1956 as she provider. Jerel was able to find patient now with the correct birthday. Wants to know if provider could resend the nebulizer prescription with the patient correct birthday showing.   Jerel- [phone]

## 2024-09-19 ENCOUNTER — Ambulatory Visit: Attending: Nurse Practitioner | Admitting: Audiologist

## 2024-09-19 ENCOUNTER — Encounter: Payer: Self-pay | Admitting: Audiologist

## 2024-09-19 DIAGNOSIS — H833X3 Noise effects on inner ear, bilateral: Secondary | ICD-10-CM | POA: Insufficient documentation

## 2024-09-19 DIAGNOSIS — H903 Sensorineural hearing loss, bilateral: Secondary | ICD-10-CM | POA: Insufficient documentation

## 2024-09-19 NOTE — Procedures (Signed)
°  Outpatient Audiology and [facility] [address] [place], [place]  [address] [phone]  AUDIOLOGICAL  EVALUATION  NAME: Kyle Campos     DOB:   May 27, 1956      MRN: [id number]                                                                                     DATE: 09/19/2024     REFERENT: Theotis Haze ORN, NP STATUS: Outpatient DIAGNOSIS: Sensorineural Hearing Loss Bilateral    History: [name] was seen for an audiological evaluation as part of his on boarding for Medicare.  [name] denies pain, pressure, or tinnitus. He feels he hears well.  [name] has significant history of hazardous noise exposure. He uses saws and power tools daily with no hearing protection. He hunts using firearms regularly without hearing protection. 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  He does not perceive any difference in hearing between his ears. He is a right handed shooter.  Medical history shows no medical risk for hearing loss.    Evaluation:  Otoscopy showed a clear view of the tympanic membranes, bilaterally Tympanometry results were consistent with normal middle ear function, bilaterally   Audiometric testing was completed using Conventional Audiometry techniques with insert earphones and supraural headphones. Test results are consistent with normal hearing dropping at 1.5kHz to moderately severe sensorineural hearing loss. Asymmetry with right ear worse at 1.5kHz only. Speech Recognition Thresholds were obtained at 25dB HL in the right ear and at 20dB HL in the left ear. Word Recognition Testing was completed at  40dB SL and [name] and 72% in the left ear.   Results:  The test results were reviewed with [name]. He has moderately severe high pitched hearing loss bilaterally. His hearing is asymmetric with right ear worse at 1.5kHz. He is not able to hear high pitched consonants like /f/ /th/ and /p/. He is likely lipreading more than he  realizes. [name] and knows he will not use hearing aids. He feels he hears well. He is open to starting to wear hearing protection.  Audiogram printed and provided to [employer]    Recommendations: Hearing aids recommended for both ears. Patient given list of local hearing aid providers. Follow up at a hearing aid office once ready for aids. Annual audiometric testing recommended to monitor hearing loss for progression. Need to monitor right ear for progression of asymmetry. Likely due to firearms in right handed shooter, but still warrants follow up.  Wear earplugs and over the ear protection when using power tools and firearms.   32 minutes spent testing and counseling on results.   If you have any questions please feel free to contact me at [phone].  Lauraine Ka Stalnaker Au.D.  Audiologist   09/19/2024  9:31 AM  Cc: Theotis Haze ORN, NP

## 2024-09-25 ENCOUNTER — Encounter: Payer: Self-pay | Admitting: Nurse Practitioner

## 2024-09-25 DIAGNOSIS — E1165 Type 2 diabetes mellitus with hyperglycemia: Secondary | ICD-10-CM

## 2024-11-05 ENCOUNTER — Other Ambulatory Visit: Payer: Self-pay | Admitting: Family Medicine

## 2024-11-05 DIAGNOSIS — I10 Essential (primary) hypertension: Secondary | ICD-10-CM

## 2024-12-04 ENCOUNTER — Ambulatory Visit: Admitting: Nurse Practitioner
